# Patient Record
Sex: Female | Born: 1935
Health system: Southern US, Community
[De-identification: ages and names within clinical notes are randomized; demographics above are authoritative.]

## PROBLEM LIST (undated history)

## (undated) DIAGNOSIS — M8580 Other specified disorders of bone density and structure, unspecified site: Secondary | ICD-10-CM

## (undated) DIAGNOSIS — I350 Nonrheumatic aortic (valve) stenosis: Secondary | ICD-10-CM

## (undated) DIAGNOSIS — E039 Hypothyroidism, unspecified: Secondary | ICD-10-CM

## (undated) DIAGNOSIS — E785 Hyperlipidemia, unspecified: Secondary | ICD-10-CM

## (undated) DIAGNOSIS — I4891 Unspecified atrial fibrillation: Secondary | ICD-10-CM

## (undated) DIAGNOSIS — I10 Essential (primary) hypertension: Secondary | ICD-10-CM

## (undated) DIAGNOSIS — Z5320 Procedure and treatment not carried out because of patient's decision for unspecified reasons: Secondary | ICD-10-CM

## (undated) DIAGNOSIS — R011 Cardiac murmur, unspecified: Secondary | ICD-10-CM

## (undated) DIAGNOSIS — H8110 Benign paroxysmal vertigo, unspecified ear: Secondary | ICD-10-CM

## (undated) DIAGNOSIS — D649 Anemia, unspecified: Secondary | ICD-10-CM

## (undated) HISTORY — DX: Nonrheumatic aortic (valve) stenosis: I35.0

## (undated) HISTORY — PX: CYSTOSCOPY: SUR368

## (undated) HISTORY — DX: Other specified disorders of bone density and structure, unspecified site: M85.80

## (undated) HISTORY — DX: Procedure and treatment not carried out because of patient's decision for unspecified reasons: Z53.20

## (undated) HISTORY — DX: Essential (primary) hypertension: I10

## (undated) HISTORY — DX: Hypothyroidism, unspecified: E03.9

## (undated) HISTORY — DX: Hyperlipidemia, unspecified: E78.5

## (undated) HISTORY — DX: Unspecified atrial fibrillation: I48.91

## (undated) HISTORY — DX: Benign paroxysmal vertigo, unspecified ear: H81.10

## (undated) HISTORY — DX: Anemia, unspecified: D64.9

## (undated) HISTORY — PX: CARDIAC CATHETERIZATION: SHX172

## (undated) HISTORY — DX: Cardiac murmur, unspecified: R01.1

---

## 2001-06-06 HISTORY — PX: ABDOMINAL HYSTERECTOMY: SHX81

## 2002-03-22 ENCOUNTER — Observation Stay (HOSPITAL_COMMUNITY): Admission: RE | Admit: 2002-03-22 | Discharge: 2002-03-23 | Payer: Self-pay | Admitting: Obstetrics and Gynecology

## 2002-03-22 ENCOUNTER — Encounter: Payer: Self-pay | Admitting: Obstetrics and Gynecology

## 2002-03-22 ENCOUNTER — Encounter (INDEPENDENT_AMBULATORY_CARE_PROVIDER_SITE_OTHER): Payer: Self-pay | Admitting: Specialist

## 2003-06-07 DIAGNOSIS — H8110 Benign paroxysmal vertigo, unspecified ear: Secondary | ICD-10-CM

## 2003-06-07 HISTORY — DX: Benign paroxysmal vertigo, unspecified ear: H81.10

## 2003-11-11 ENCOUNTER — Inpatient Hospital Stay (HOSPITAL_COMMUNITY): Admission: EM | Admit: 2003-11-11 | Discharge: 2003-11-12 | Payer: Self-pay | Admitting: Emergency Medicine

## 2004-03-18 ENCOUNTER — Encounter: Payer: Self-pay | Admitting: Internal Medicine

## 2004-04-16 ENCOUNTER — Ambulatory Visit: Payer: Self-pay | Admitting: Internal Medicine

## 2004-04-27 ENCOUNTER — Ambulatory Visit: Payer: Self-pay | Admitting: Internal Medicine

## 2004-10-06 ENCOUNTER — Ambulatory Visit: Payer: Self-pay | Admitting: Internal Medicine

## 2005-04-01 ENCOUNTER — Ambulatory Visit: Payer: Self-pay | Admitting: Internal Medicine

## 2005-05-11 ENCOUNTER — Ambulatory Visit: Payer: Self-pay | Admitting: Internal Medicine

## 2006-03-07 ENCOUNTER — Ambulatory Visit: Payer: Self-pay | Admitting: Internal Medicine

## 2006-05-17 ENCOUNTER — Ambulatory Visit: Payer: Self-pay | Admitting: Internal Medicine

## 2006-05-17 LAB — CONVERTED CEMR LAB
ALT: 21 units/L (ref 0–40)
AST: 25 units/L (ref 0–37)
BUN: 17 mg/dL (ref 6–23)
Chol/HDL Ratio, serum: 2.5
Cholesterol: 164 mg/dL (ref 0–200)
Creatinine, Ser: 0.8 mg/dL (ref 0.4–1.2)
Glucose, Bld: 89 mg/dL (ref 70–99)
HDL: 66.1 mg/dL (ref >39.0)
LDL Cholesterol: 85 mg/dL (ref 0–99)
Potassium: 4.2 meq/L (ref 3.5–5.1)
TSH: 3.02 microintl units/mL (ref 0.35–5.50)
Triglyceride fasting, serum: 63 mg/dL (ref 0–149)
VLDL: 13 mg/dL (ref 0–40)

## 2007-04-06 ENCOUNTER — Ambulatory Visit: Payer: Self-pay | Admitting: Internal Medicine

## 2007-05-23 ENCOUNTER — Encounter: Payer: Self-pay | Admitting: Internal Medicine

## 2007-06-13 ENCOUNTER — Telehealth (INDEPENDENT_AMBULATORY_CARE_PROVIDER_SITE_OTHER): Payer: Self-pay | Admitting: *Deleted

## 2007-06-25 ENCOUNTER — Ambulatory Visit: Payer: Self-pay | Admitting: Internal Medicine

## 2007-06-25 DIAGNOSIS — E785 Hyperlipidemia, unspecified: Secondary | ICD-10-CM | POA: Insufficient documentation

## 2007-06-25 DIAGNOSIS — E039 Hypothyroidism, unspecified: Secondary | ICD-10-CM

## 2007-06-26 ENCOUNTER — Encounter (INDEPENDENT_AMBULATORY_CARE_PROVIDER_SITE_OTHER): Payer: Self-pay | Admitting: *Deleted

## 2007-07-03 ENCOUNTER — Ambulatory Visit: Payer: Self-pay | Admitting: Internal Medicine

## 2007-07-17 ENCOUNTER — Encounter: Payer: Self-pay | Admitting: Internal Medicine

## 2007-07-23 ENCOUNTER — Telehealth (INDEPENDENT_AMBULATORY_CARE_PROVIDER_SITE_OTHER): Payer: Self-pay | Admitting: *Deleted

## 2007-07-23 ENCOUNTER — Encounter (INDEPENDENT_AMBULATORY_CARE_PROVIDER_SITE_OTHER): Payer: Self-pay | Admitting: *Deleted

## 2008-03-04 ENCOUNTER — Ambulatory Visit: Payer: Self-pay | Admitting: Internal Medicine

## 2008-04-10 ENCOUNTER — Encounter (INDEPENDENT_AMBULATORY_CARE_PROVIDER_SITE_OTHER): Payer: Self-pay | Admitting: *Deleted

## 2008-04-10 ENCOUNTER — Telehealth (INDEPENDENT_AMBULATORY_CARE_PROVIDER_SITE_OTHER): Payer: Self-pay | Admitting: *Deleted

## 2008-07-01 ENCOUNTER — Ambulatory Visit: Payer: Self-pay | Admitting: Internal Medicine

## 2008-07-02 ENCOUNTER — Encounter (INDEPENDENT_AMBULATORY_CARE_PROVIDER_SITE_OTHER): Payer: Self-pay | Admitting: *Deleted

## 2008-07-09 ENCOUNTER — Telehealth (INDEPENDENT_AMBULATORY_CARE_PROVIDER_SITE_OTHER): Payer: Self-pay | Admitting: *Deleted

## 2008-07-16 ENCOUNTER — Ambulatory Visit: Payer: Self-pay | Admitting: Internal Medicine

## 2008-07-16 LAB — CONVERTED CEMR LAB
OCCULT 1: NEGATIVE
OCCULT 2: NEGATIVE
OCCULT 3: NEGATIVE

## 2008-07-17 ENCOUNTER — Encounter (INDEPENDENT_AMBULATORY_CARE_PROVIDER_SITE_OTHER): Payer: Self-pay | Admitting: *Deleted

## 2008-08-07 ENCOUNTER — Telehealth (INDEPENDENT_AMBULATORY_CARE_PROVIDER_SITE_OTHER): Payer: Self-pay | Admitting: *Deleted

## 2008-09-04 ENCOUNTER — Telehealth (INDEPENDENT_AMBULATORY_CARE_PROVIDER_SITE_OTHER): Payer: Self-pay | Admitting: *Deleted

## 2008-10-09 ENCOUNTER — Telehealth (INDEPENDENT_AMBULATORY_CARE_PROVIDER_SITE_OTHER): Payer: Self-pay | Admitting: *Deleted

## 2009-06-25 ENCOUNTER — Ambulatory Visit: Payer: Self-pay | Admitting: Internal Medicine

## 2009-06-25 DIAGNOSIS — N959 Unspecified menopausal and perimenopausal disorder: Secondary | ICD-10-CM | POA: Insufficient documentation

## 2009-06-25 DIAGNOSIS — I10 Essential (primary) hypertension: Secondary | ICD-10-CM | POA: Insufficient documentation

## 2009-06-25 DIAGNOSIS — L659 Nonscarring hair loss, unspecified: Secondary | ICD-10-CM | POA: Insufficient documentation

## 2009-09-02 ENCOUNTER — Telehealth (INDEPENDENT_AMBULATORY_CARE_PROVIDER_SITE_OTHER): Payer: Self-pay | Admitting: *Deleted

## 2010-06-23 ENCOUNTER — Telehealth (INDEPENDENT_AMBULATORY_CARE_PROVIDER_SITE_OTHER): Payer: Self-pay | Admitting: *Deleted

## 2010-07-04 LAB — CONVERTED CEMR LAB
ALT: 21 units/L (ref 0–35)
ALT: 21 units/L (ref 0–35)
ALT: 22 units/L (ref 0–35)
AST: 23 units/L (ref 0–37)
AST: 24 units/L (ref 0–37)
AST: 25 units/L (ref 0–37)
Albumin: 4 g/dL (ref 3.5–5.2)
Albumin: 4.1 g/dL (ref 3.5–5.2)
Albumin: 4.5 g/dL (ref 3.5–5.2)
Alkaline Phosphatase: 55 units/L (ref 39–117)
Alkaline Phosphatase: 63 units/L (ref 39–117)
Alkaline Phosphatase: 69 units/L (ref 39–117)
BUN: 15 mg/dL (ref 6–23)
BUN: 19 mg/dL (ref 6–23)
BUN: 21 mg/dL (ref 6–23)
Basophils Absolute: 0 10*3/uL (ref 0.0–0.1)
Basophils Absolute: 0 10*3/uL (ref 0.0–0.1)
Basophils Absolute: 0 10*3/uL (ref 0.0–0.1)
Basophils Relative: 0 % (ref 0.0–3.0)
Basophils Relative: 0.5 % (ref 0.0–1.0)
Basophils Relative: 0.9 % (ref 0.0–3.0)
Bilirubin, Direct: 0.1 mg/dL (ref 0.0–0.3)
Bilirubin, Direct: 0.1 mg/dL (ref 0.0–0.3)
Bilirubin, Direct: 0.1 mg/dL (ref 0.0–0.3)
CO2: 31 meq/L (ref 19–32)
CO2: 31 meq/L (ref 19–32)
CO2: 32 meq/L (ref 19–32)
Calcium: 9.3 mg/dL (ref 8.4–10.5)
Calcium: 9.3 mg/dL (ref 8.4–10.5)
Calcium: 9.6 mg/dL (ref 8.4–10.5)
Chloride: 102 meq/L (ref 96–112)
Chloride: 103 meq/L (ref 96–112)
Chloride: 99 meq/L (ref 96–112)
Cholesterol, target level: 200 mg/dL
Cholesterol: 170 mg/dL (ref 0–200)
Cholesterol: 174 mg/dL (ref 0–200)
Cholesterol: 189 mg/dL (ref 0–200)
Creatinine, Ser: 0.8 mg/dL (ref 0.4–1.2)
Creatinine, Ser: 0.8 mg/dL (ref 0.4–1.2)
Creatinine, Ser: 0.9 mg/dL (ref 0.4–1.2)
Eosinophils Absolute: 0.2 10*3/uL (ref 0.0–0.7)
Eosinophils Absolute: 0.2 10*3/uL (ref 0.0–0.7)
Eosinophils Absolute: 0.3 10*3/uL (ref 0.0–0.6)
Eosinophils Relative: 3.4 % (ref 0.0–5.0)
Eosinophils Relative: 5.9 % — ABNORMAL HIGH (ref 0.0–5.0)
Eosinophils Relative: 6.6 % — ABNORMAL HIGH (ref 0.0–5.0)
GFR calc Af Amer: 79 mL/min
GFR calc Af Amer: 90 mL/min
GFR calc non Af Amer: 65 mL/min
GFR calc non Af Amer: 74.41 mL/min (ref >60)
GFR calc non Af Amer: 75 mL/min
Glucose, Bld: 80 mg/dL (ref 70–99)
Glucose, Bld: 86 mg/dL (ref 70–99)
Glucose, Bld: 89 mg/dL (ref 70–99)
HCT: 39.8 % (ref 36.0–46.0)
HCT: 40.6 % (ref 36.0–46.0)
HCT: 41.1 % (ref 36.0–46.0)
HDL goal, serum: 40 mg/dL
HDL: 64.6 mg/dL (ref >39.0)
HDL: 69.8 mg/dL (ref >39.0)
HDL: 77.2 mg/dL (ref >39.00)
Hemoglobin: 13.8 g/dL (ref 12.0–15.0)
Hemoglobin: 13.9 g/dL (ref 12.0–15.0)
Hemoglobin: 14.1 g/dL (ref 12.0–15.0)
LDL Cholesterol: 83 mg/dL (ref 0–99)
LDL Cholesterol: 84 mg/dL (ref 0–99)
LDL Cholesterol: 94 mg/dL (ref 0–99)
LDL Goal: 160 mg/dL
Lymphocytes Relative: 26.6 % (ref 12.0–46.0)
Lymphocytes Relative: 27.4 % (ref 12.0–46.0)
Lymphocytes Relative: 29.8 % (ref 12.0–46.0)
Lymphs Abs: 1.1 10*3/uL (ref 0.7–4.0)
MCHC: 33.6 g/dL (ref 30.0–36.0)
MCHC: 34.4 g/dL (ref 30.0–36.0)
MCHC: 35.3 g/dL (ref 30.0–36.0)
MCV: 100 fL (ref 78.0–100.0)
MCV: 102.9 fL — ABNORMAL HIGH (ref 78.0–100.0)
MCV: 99.1 fL (ref 78.0–100.0)
Monocytes Absolute: 0.4 10*3/uL (ref 0.1–1.0)
Monocytes Absolute: 0.4 10*3/uL (ref 0.2–0.7)
Monocytes Absolute: 0.5 10*3/uL (ref 0.1–1.0)
Monocytes Relative: 10.7 % (ref 3.0–12.0)
Monocytes Relative: 9.7 % (ref 3.0–11.0)
Monocytes Relative: 9.8 % (ref 3.0–12.0)
Neutro Abs: 2 10*3/uL (ref 1.4–7.7)
Neutro Abs: 2.2 10*3/uL (ref 1.4–7.7)
Neutro Abs: 2.9 10*3/uL (ref 1.4–7.7)
Neutrophils Relative %: 53.4 % (ref 43.0–77.0)
Neutrophils Relative %: 56 % (ref 43.0–77.0)
Neutrophils Relative %: 59.3 % (ref 43.0–77.0)
Platelets: 166 10*3/uL (ref 150.0–400.0)
Platelets: 184 10*3/uL (ref 150–400)
Platelets: 186 10*3/uL (ref 150–400)
Potassium: 3.5 meq/L (ref 3.5–5.1)
Potassium: 3.7 meq/L (ref 3.5–5.1)
Potassium: 3.7 meq/L (ref 3.5–5.1)
RBC: 4 M/uL (ref 3.87–5.11)
RBC: 4.02 M/uL (ref 3.87–5.11)
RBC: 4.06 M/uL (ref 3.87–5.11)
RDW: 11.2 % — ABNORMAL LOW (ref 11.5–14.6)
RDW: 11.6 % (ref 11.5–14.6)
RDW: 11.7 % (ref 11.5–14.6)
Sodium: 138 meq/L (ref 135–145)
Sodium: 142 meq/L (ref 135–145)
Sodium: 143 meq/L (ref 135–145)
TSH: 2.98 microintl units/mL (ref 0.35–5.50)
TSH: 3.08 microintl units/mL (ref 0.35–5.50)
TSH: 4.85 microintl units/mL (ref 0.35–5.50)
Total Bilirubin: 0.9 mg/dL (ref 0.3–1.2)
Total Bilirubin: 1 mg/dL (ref 0.3–1.2)
Total Bilirubin: 1 mg/dL (ref 0.3–1.2)
Total CHOL/HDL Ratio: 2
Total CHOL/HDL Ratio: 2.5
Total CHOL/HDL Ratio: 2.6
Total Protein: 6.9 g/dL (ref 6.0–8.3)
Total Protein: 7.2 g/dL (ref 6.0–8.3)
Total Protein: 7.8 g/dL (ref 6.0–8.3)
Triglycerides: 100 mg/dL (ref 0–149)
Triglycerides: 110 mg/dL (ref 0–149)
Triglycerides: 88 mg/dL (ref 0.0–149.0)
VLDL: 17.6 mg/dL (ref 0.0–40.0)
VLDL: 20 mg/dL (ref 0–40)
VLDL: 22 mg/dL (ref 0–40)
Vit D, 25-Hydroxy: 26 ng/mL — ABNORMAL LOW (ref 30–89)
WBC: 3.8 10*3/uL — ABNORMAL LOW (ref 4.5–10.5)
WBC: 3.9 10*3/uL — ABNORMAL LOW (ref 4.5–10.5)
WBC: 4.9 10*3/uL (ref 4.5–10.5)

## 2010-07-06 ENCOUNTER — Ambulatory Visit
Admission: RE | Admit: 2010-07-06 | Discharge: 2010-07-06 | Payer: Self-pay | Source: Home / Self Care | Attending: Internal Medicine | Admitting: Internal Medicine

## 2010-07-06 ENCOUNTER — Encounter: Payer: Self-pay | Admitting: Internal Medicine

## 2010-07-06 ENCOUNTER — Other Ambulatory Visit: Payer: Self-pay | Admitting: Internal Medicine

## 2010-07-06 LAB — LIPID PANEL
Cholesterol: 192 mg/dL (ref 0–200)
HDL: 76.6 mg/dL (ref >39.00)
LDL Cholesterol: 91 mg/dL (ref 0–99)
Triglycerides: 124 mg/dL (ref 0.0–149.0)

## 2010-07-06 LAB — BASIC METABOLIC PANEL
BUN: 20 mg/dL (ref 6–23)
Chloride: 103 mEq/L (ref 96–112)
Creatinine, Ser: 0.7 mg/dL (ref 0.4–1.2)

## 2010-07-06 LAB — TSH: TSH: 4.21 u[IU]/mL (ref 0.35–5.50)

## 2010-07-06 LAB — HEPATIC FUNCTION PANEL
ALT: 19 U/L (ref 0–35)
Total Bilirubin: 0.7 mg/dL (ref 0.3–1.2)
Total Protein: 7.1 g/dL (ref 6.0–8.3)

## 2010-07-06 NOTE — Progress Notes (Signed)
Summary: Refill Request  Phone Note Refill Request Message from:  Pharmacy on [facility] Fax #: [phone]  Refills Requested: Medication #1:  LIPITOR 10 MG  TABS 1 by mouth at bedtime   Dosage confirmed as above?Dosage Confirmed   Supply Requested: 3 months   Last Refilled: 06/03/2009 Next Appointment Scheduled: none Initial call taken by: Harold Barban,  September 02, 2009 12:48 PM    Prescriptions: LIPITOR 10 MG  TABS (ATORVASTATIN CALCIUM) 1 by mouth at bedtime  #90 x 2   Entered by:   Shonna Chock   Authorized by:   Marga Melnick MD   Signed by:   Shonna Chock on 09/02/2009   Method used:   Electronically to        [facility]* (retail)       [address]       [place], [place]  [address]       Ph: [phone]       Fax: [phone]   RxID:   [id number]

## 2010-07-06 NOTE — Assessment & Plan Note (Signed)
Summary: EST MEDICARE //PH   Vital Signs:  Patient profile:   77 year old female Height:      68.25 inches Weight:      140.0 pounds BMI:     21.21 Temp:     98.1 degrees F oral Pulse rate:   59 / minute Resp:     14 per minute BP sitting:   124 / 78  (left arm) Cuff size:   large  Vitals Entered By: Shonna Chock (June 25, 2009 8:25 AM)   CC: Yearly follow-up on meds and fasting labs , General Medical Evaluation Comments REVIEWED MED LIST, PATIENT AGREED DOSE AND INSTRUCTION CORRECT   Flu Vaccine Consent Questions     Do you have a history of severe allergic reactions to this vaccine? no    Any prior history of allergic reactions to egg and/or gelatin? no    Do you have a sensitivity to the preservative Thimersol? no    Do you have a past history of Guillan-Barre Syndrome? no    Do you currently have an acute febrile illness? no    Have you ever had a severe reaction to latex? no    Vaccine information given and explained to patient? yes    Are you currently pregnant? no    Lot Number:[id number]   Exp Date:12/03/2009   Site Given  Right Deltoid IM   CC:  Yearly follow-up on meds and fasting labs  and General Medical Evaluation.  History of Present Illness: Judy Walker is here for a BCBS/ Medicare physical; she is asymptomatic. 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  Allergies (verified): No Known Drug Allergies  Past History:  Past Medical History: Hyperlipidemia (total cholesterol 340 in 2003) Hypothyroidism Benign Positional Vertigo 2005, PMH of  Past Surgical History: Hysterectomy  & BSO for cystic ovaries Cath 2004 neg; bladder cysts resected age 79; No colonoscopy :"I elected not to have one"(SOC reviewed 06/25/2009; she continues to decline colonoscopy)  Family History: Father: CAD,DM, MI in 80s Mother: renal  cancer Siblings: 2 sisters breast CA,bro CHF,sister cirrhosis,sister mini strokes; DM in 3  bro &  3 sisters; sister esophageal CA; sister ovarian CA; 2 bro d MI ( 1 in 60s & 2nd in 61s)  Social History: No diet Married(husband has severe intractable Mental Health  issues ) Never Smoked Regular exercise(see HPI) Alcohol use-no Retired  Review of Systems  The patient denies anorexia, fever, weight loss, weight gain, vision loss, decreased hearing, hoarseness, chest pain, syncope, dyspnea on exertion, peripheral edema, prolonged cough, headaches, hemoptysis, abdominal pain, melena, hematochezia, severe indigestion/heartburn, hematuria, incontinence, suspicious skin lesions, depression, unusual weight change, abnormal bleeding, and enlarged lymph nodes.         Increased hair loss; no other skin or nail changes. No temp intolerance. Gyn survelliance up to date.  Physical Exam  General:  Thin but well-nourished,in no acute distress; alert,appropriate and cooperative throughout examination Head:  Normocephalic and atraumatic without obvious abnormalities. No apparent alopecia  Eyes:  No corneal or conjunctival inflammation noted.  Perrla. Funduscopic exam benign, without hemorrhages, exudates or papilledema.  Ears:  External ear exam shows no significant lesions or deformities.  Otoscopic examination reveals clear canals, tympanic membranes are intact bilaterally without bulging, retraction, inflammation or discharge. Hearing is grossly normal bilaterally.Minimal TM scarring Nose:  External nasal examination shows no deformity or inflammation. Nasal  mucosa are pink and moist without lesions or exudates. Septal dislocation Mouth:  Oral mucosa and oropharynx without lesions or exudates.  Teeth in good repair. Neck:  No deformities, masses, or tenderness noted. Lungs:  Normal respiratory effort, chest expands symmetrically. Lungs are clear to auscultation, no crackles or wheezes. Heart:  normal  rate, regular rhythm, no gallop, no rub, no JVD, no HJR, and grade 1 /6 systolic murmur.   Abdomen:  Bowel sounds positive,abdomen soft and non-tender without masses, organomegaly or hernias noted. Aorta palpable but no AAA Genitalia:  Dr Elana Alm Msk:  No deformity or scoliosis noted of thoracic or lumbar spine.   Pulses:  R and L carotid,radial,dorsalis pedis and posterior tibial pulses are full and equal bilaterally Extremities:  No clubbing, cyanosis, edema, or deformity noted with normal full range of motion of all joints.   Neurologic:  alert & oriented X3 and DTRs symmetrical and normal.   Skin:  Intact without suspicious lesions or rashes Cervical Nodes:  No lymphadenopathy noted Axillary Nodes:  No palpable lymphadenopathy Psych:  memory intact for recent and remote, normally interactive, good eye contact, not anxious appearing, and not depressed appearing.     Impression & Recommendations:  Problem # 1:  PREVENTIVE HEALTH CARE (ICD-V70.0)  Orders: Venipuncture (42706) TLB-Lipid Panel (80061-LIPID) TLB-BMP (Basic Metabolic Panel-BMET) (80048-METABOL) TLB-CBC Platelet - w/Differential (85025-CBCD) TLB-Hepatic/Liver Function Pnl (80076-HEPATIC) TLB-TSH (Thyroid Stimulating Hormone) (84443-TSH) EKG w/ Interpretation (93000) T-Vitamin D (25-Hydroxy) (23762-83151)  Problem # 2:  HAIR LOSS (ICD-704.00)  Orders: Venipuncture (76160)  Problem # 3:  HYPOTHYROIDISM (ICD-244.9)  Her updated medication list for this problem includes:    Levothyroxine Sodium 75 Mcg Tabs (Levothyroxine sodium) .Marland Kitchen... 1 qd  Orders: Venipuncture (73710) TLB-TSH (Thyroid Stimulating Hormone) (84443-TSH)  Problem # 4:  HYPERLIPIDEMIA (ICD-272.4)  Her updated medication list for this problem includes:    Lipitor 10 Mg Tabs (Atorvastatin calcium) .Marland Kitchen... 1 by mouth at bedtime  Orders: Venipuncture (62694) TLB-Lipid Panel (80061-LIPID) EKG w/ Interpretation (93000)  Problem # 5:  HYPERTENSION  (ICD-401.9)  Her updated medication list for this problem includes:    Hydrochlorothiazide 12.5 Mg Tabs (Hydrochlorothiazide) .Marland Kitchen... 1 by mouth once daily  Orders: TLB-BMP (Basic Metabolic Panel-BMET) (80048-METABOL) EKG w/ Interpretation (93000)  Problem # 6:  POSTMENOPAUSAL SYNDROME (ICD-627.9)  Orders: T-Vitamin D (25-Hydroxy) (85462-70350)  Complete Medication List: 1)  Lisinopril 40 Mg Tabs (lisinopril)  .Marland Kitchen.. 1 by mouth once daily 2)  Lipitor 10 Mg Tabs (Atorvastatin calcium) .Marland Kitchen.. 1 by mouth at bedtime 3)  Hydrochlorothiazide 12.5 Mg Tabs (Hydrochlorothiazide) .Marland Kitchen.. 1 by mouth once daily 4)  Flax Seed Oil  .Marland Kitchen.. 1 by mouth once daily 5)  Calcium 500mg   .... 1 by mouth two times a day 6)  Levothyroxine Sodium 75 Mcg Tabs (Levothyroxine sodium) .Marland Kitchen.. 1 qd 7)  Folic Acid 400 Mcg Tabs (Folic acid) .Marland Kitchen.. 1 by mouth once daily 8)  Multivitamins Tabs (Multiple vitamin) .Marland Kitchen.. 1 by mouth once daily  Other Orders: Flu Vaccine 8yrs + (09381) Administration Flu vaccine - MCR (W2993)  Patient Instructions: 1)  Check your Blood Pressure regularly. If it is above:140/90 ON AVERAGE  you should make an appointment. Prescriptions: LEVOTHYROXINE SODIUM 75 MCG  TABS (LEVOTHYROXINE SODIUM) 1 qd  #90 x 3   Entered and Authorized by:   Marga Melnick MD   Signed by:   Marga Melnick MD on 06/25/2009   Method used:   Faxed to ...       [facility]* (retail)  [address]       [place], [place]  [address]       Ph: [phone]       Fax: [phone]   RxID:   [id number] HYDROCHLOROTHIAZIDE 12.5 MG  TABS (HYDROCHLOROTHIAZIDE) 1 by mouth once daily  #90 x 3   Entered and Authorized by:   Marga Melnick MD   Signed by:   Marga Melnick MD on 06/25/2009   Method used:   Faxed to ...       [facility]* (retail)       [address]       [place], [place]  [address]       Ph: [phone]       Fax: [phone]   RxID:   [id number] LIPITOR 10 MG  TABS (ATORVASTATIN CALCIUM) 1  by mouth at bedtime  #90 x 3   Entered and Authorized by:   Marga Melnick MD   Signed by:   Marga Melnick MD on 06/25/2009   Method used:   Faxed to ...       [facility]* (retail)       [address]       [place], [place]  [address]       Ph: [phone]       Fax: [phone]   RxID:   [id number] LISINOPRIL 40 MG  TABS (LISINOPRIL) 1 by mouth once daily  #90 x 3   Entered and Authorized by:   Marga Melnick MD   Signed by:   Marga Melnick MD on 06/25/2009   Method used:   Faxed to ...       [facility]* (retail)       [address]       [place], [place]  [address]       Ph: [phone]       Fax: [phone]   RxID:   [id number]

## 2010-07-08 NOTE — Progress Notes (Signed)
Summary: REFILL  Phone Note Refill Request Call back at [phone] Message from:  Pharmacy on June 23, 2010 4:22 PM  Refills Requested: Medication #1:  LISINOPRIL 40 MG  TABS (LISINOPRIL) 1 by mouth once daily   Dosage confirmed as above?Dosage Confirmed   Supply Requested: 1 month [facility]  Next Appointment Scheduled: 1.31.12 Initial call taken by: Lavell Islam,  June 23, 2010 4:22 PM    Prescriptions: LISINOPRIL 40 MG  TABS (LISINOPRIL) 1 by mouth once daily  #30 x 0   Entered by:   Shonna Chock CMA   Authorized by:   Marga Melnick MD   Signed by:   Shonna Chock CMA on 06/24/2010   Method used:   Faxed to ...       [facility]* (retail)       [address]       [place], [place]  [address]       Ph: [phone]       Fax: [phone]   RxID:   [id number]

## 2010-07-14 ENCOUNTER — Other Ambulatory Visit (INDEPENDENT_AMBULATORY_CARE_PROVIDER_SITE_OTHER): Payer: Medicare Other

## 2010-07-14 ENCOUNTER — Encounter: Payer: Self-pay | Admitting: Internal Medicine

## 2010-07-14 DIAGNOSIS — Z1211 Encounter for screening for malignant neoplasm of colon: Secondary | ICD-10-CM

## 2010-07-14 LAB — CONVERTED CEMR LAB: OCCULT 1: NEGATIVE

## 2010-07-14 NOTE — Assessment & Plan Note (Signed)
Summary: CPX,FASTING,MEDICARE & BCBS/RH......   Vital Signs:  Patient profile:   77 year old female Height:      68 inches Weight:      143 pounds BMI:     21.82 Temp:     98.0 degrees F oral Pulse rate:   80 / minute Resp:     14 per minute BP sitting:   120 / 72  (left arm) Cuff size:   regular  Vitals Entered By: Shonna Chock CMA (July 06, 2010 9:54 AM) CC: Lipid Management  Vision Screening:Left eye with correction: 20 / 50 Right eye with correction: 20 / 50 Both eyes with correction: 20 / 50       Vision Comments: Patient wears glasses at all times   Vision Entered By: Shonna Chock CMA (July 06, 2010 9:54 AM)   CC:  Lipid Management.  History of Present Illness: Here for Medicare AWV: 1.Risk factors based on Past M, S, F history:see Diagnoses ; chart updated 2.Physical Activities:  treadmil , walking 3-4X/week 3.Depression/mood:no issues  4.Hearing:whisper heard @ 6 ft  5.ADL's: no limitations 6.Fall Risk:no issues   7.Home Safety: no safety  issues 8.Height, weight, &visual acuity:see VS 9.Counseling: POA & Living Will in place 10.Labs ordered based on risk factors:  11. Referral Coordination: see Orders/ Instructions. Mammogram  today. Colonoscopy SOC reviewed. 12. Care Plan: see Instructions  13. Cognitive Assessment: Oriented X 3;  memory & recall  good    ; "WORLD" spelled backwards ; mood  & affect normal.    Hyperlipidemia Follow-Up: The patient denies muscle aches, GI upset, abdominal pain, flushing, itching, constipation, diarrhea, and fatigue.  The patient denies the following symptoms: chest pain/pressure, exercise intolerance, dypsnea, palpitations, syncope, and pedal edema.  Compliance with medications (by patient report) has been near 100%.  Dietary compliance has been excellent.  Adjunctive measures currently used by the patient include fiber, folic acid, and  flax seed  oil supplements.      Hypertension Follow-Up: The patient denies  lightheadedness, urinary frequency, and headaches.  Compliance with medications (by patient report) has been near 100%.  Adjunctive measures currently used by the patient include  modified  salt restriction.  BP < 135/85 on average @ home.   Lipid Management History:      Positive NCEP/ATP III risk factors include female age 59 years old or older, family history for ischemic heart disease (males less than 56 years old), and hypertension.  Negative NCEP/ATP III risk factors include no history of early menopause without estrogen hormone replacement, non-diabetic, HDL cholesterol greater than 60, non-tobacco-user status, no ASHD (atherosclerotic heart disease), no prior stroke/TIA, no peripheral vascular disease, and no history of aortic aneurysm.     Preventive Screening-Counseling & Management  Alcohol-Tobacco     Alcohol drinks/day: 0     Smoking Status: never  Caffeine-Diet-Exercise     Caffeine use/day: 2 cups coffee, rare tea  Hep-HIV-STD-Contraception     Dental Visit-last 6 months care giving prevents F/U     Sun Exposure-Excessive: no  Safety-Violence-Falls     Seat Belt Use: yes     Smoke Detectors: yes      Blood Transfusions:  no.        Travel History:  [place]  remotely.    Allergies (verified): No Known Drug Allergies  Past History:  Past Medical History: Hyperlipidemia (total cholesterol 340 in 2003). NHDL 61 , TG 75. NMR Lipoprofile 2005:LDL 84( 933/285), LDL goal = < 120, ideally <  90. Framingham Study LDL goal = < 130. Hypothyroidism Benign Positional Vertigo 2005, PMH of  Past Surgical History: Hysterectomy  & BSO for cystic ovaries 2003 Catheterization  2004 normal ; bladder cysts resected  @ age 37; No colonoscopy :"I elected not to have one"(SOC reviewed 07/06/2010; she continues to decline colonoscopy)  Family History: Father: CAD,DM, MI in 57s Mother: renal cancer Siblings: 2 sisters: breast cancer,bro: CHF,sister: cirrhosis,sister :mini strokes; DM in  3  bro &  3 sisters; sister: esophageal  cancer; sister: ovarian cancer ; 2 bro: d MI ( 1 in 3s & 2nd in 67s); sister: Alzheimer's  Social History: No diet 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         "Tickle " & cough  ? from ACE-I.  Physical Exam  General:  well-nourished;alert,appropriate and cooperative throughout examination Head:  Normocephalic and atraumatic without obvious abnormalities.  Eyes:  No corneal or conjunctival inflammation noted.  Perrla. Funduscopic exam benign, without hemorrhages, exudates or papilledema.  Ears:  External ear exam shows no significant lesions or deformities.  Otoscopic examination reveals clear canals, tympanic membranes are intact bilaterally without bulging, retraction, inflammation or discharge. Hearing is grossly normal bilaterally. Nose:  External nasal examination shows no deformity or inflammation. Nasal mucosa are pink and moist without lesions or exudates. Septal dislocation Mouth:  Oral mucosa and oropharynx without lesions or exudates.  Teeth in good repair. Neck:  No deformities, masses, or tenderness noted. Lungs:  Normal respiratory effort, chest expands symmetrically. Lungs are clear to auscultation, no crackles or wheezes. Heart:  normal rate, regular rhythm, no gallop, no rub, no JVD, no HJR, and grade1  /6 systolic murmur.     Abdomen:  Bowel sounds positive,abdomen soft and non-tender without masses, organomegaly or hernias noted. Aorta palpable w/o AAA Genitalia:  Dr Elana Alm Msk:  No deformity or scoliosis noted of thoracic or lumbar spine.   Pulses:  R and L carotid,radial,dorsalis pedis and posterior tibial pulses are full and equal bilaterally Extremities:  No clubbing, cyanosis, edema, or deformity noted with normal full range of motion of all joints.   Neurologic:  alert & oriented X3 and DTRs symmetrical and normal.   Skin:  Intact without suspicious lesions or rashes Cervical Nodes:  No lymphadenopathy noted Axillary Nodes:  No palpable lymphadenopathy Psych:  memory intact for recent and remote, normally interactive, and good eye contact.     Impression & Recommendations:  Problem # 1:  PREVENTIVE HEALTH CARE (ICD-V70.0)  Orders: Medicare -1st Annual Wellness Visit [phone])  Problem # 2:  COUGH (ICD-786.2)  ? due to ACE-I  Problem # 3:  HYPERTENSION (ICD-401.9)  controlled Her updated medication list for this problem includes:    Hydrochlorothiazide 12.5 Mg Tabs (Hydrochlorothiazide) .Marland Kitchen... 1 by mouth once daily    Losartan Potassium 100 Mg Tabs (Losartan potassium) .Marland Kitchen... 1 once daily  Orders: EKG w/ Interpretation (93000) Venipuncture (34742) TLB-BMP (Basic Metabolic Panel-BMET) (80048-METABOL) Prescription Created Electronically [phone])  Problem # 4:  HYPERLIPIDEMIA (ICD-272.4)  Her updated medication list for this problem includes:    Lipitor 10 Mg Tabs (Atorvastatin calcium) .Marland Kitchen... 1 by mouth at bedtime  Orders: Venipuncture ([address]) TLB-Lipid Panel (80061-LIPID) TLB-Hepatic/Liver Function Pnl (80076-HEPATIC) Prescription Created Electronically [phone]) Specimen Handling (09811)  Problem # 5:  HYPOTHYROIDISM (ICD-244.9)  Her updated medication list for this problem includes:    Levothyroxine Sodium 75 Mcg Tabs (Levothyroxine sodium) .Marland Kitchen... 1 qd  Orders: Venipuncture  (91478) TLB-TSH (Thyroid Stimulating Hormone) (29562-ZHY) Prescription Created Electronically [phone]) Specimen Handling (46962)  Complete Medication List: 1)  Lipitor 10 Mg Tabs (Atorvastatin calcium) .Marland Kitchen.. 1 by mouth at bedtime 2)  Hydrochlorothiazide 12.5 Mg Tabs (Hydrochlorothiazide) .Marland Kitchen.. 1 by mouth once daily 3)  Flax Seed Oil  .Marland Kitchen.. 1 by mouth once daily 4)  Calcium 500mg   .... 1 by mouth two times a day 5)  Levothyroxine Sodium 75 Mcg Tabs (Levothyroxine sodium) .Marland Kitchen.. 1 qd 6)  Folic Acid 400 Mcg Tabs (Folic acid) .Marland Kitchen.. 1 by mouth once daily 7)  Multivitamins Tabs (Multiple vitamin) .Marland Kitchen.. 1 by mouth once daily 8)  Vitamin D 2000 Unit Tabs (Cholecalciferol) .Marland Kitchen.. 1 by mouth once daily 9)  Losartan Potassium 100 Mg Tabs (Losartan potassium) .Marland Kitchen.. 1 once daily  Other Orders: Tdap => 45yrs IM (95284) Admin 1st Vaccine (13244)  Lipid Assessment/Plan:      Based on NCEP/ATP III, the patient's risk factor category is "2 or more risk factors and a calculated 10 year CAD risk of < 20%".  The patient's lipid goals are as follows: Total cholesterol goal is 200; LDL cholesterol goal is 160; HDL cholesterol goal is 40; Triglyceride goal is 150.  Her LDL cholesterol goal has been met.    Patient Instructions: 1)  Please consider Colonoscopy. Ask  [facility] if  BMD is due  as discussed; I'll order if needed. Complete stool cards.Monitor cough after BP med change. 2)  Check your Blood Pressure regularly. If it is above: 135/85 ON AVERAGE  you should make an appointment. Prescriptions: LIPITOR 10 MG  TABS (ATORVASTATIN CALCIUM) 1 by mouth at bedtime  #90 x 3   Entered and Authorized by:   Marga Melnick MD   Signed by:   Marga Melnick MD on 07/06/2010   Method used:   Electronically to        [facility]* (retail)       [address]       [place], [place]  [address]       Ph: [phone]       Fax: [phone]   RxID:   [id number] LEVOTHYROXINE SODIUM 75 MCG  TABS (LEVOTHYROXINE SODIUM) 1 qd   #90 x 3   Entered and Authorized by:   Marga Melnick MD   Signed by:   Marga Melnick MD on 07/06/2010   Method used:   Electronically to        [facility]* (retail)       [address]       [place], [place]  [address]       Ph: [phone]       Fax: [phone]   RxID:   [id number] HYDROCHLOROTHIAZIDE 12.5 MG  TABS (HYDROCHLOROTHIAZIDE) 1 by mouth once daily  #90 x 3   Entered and Authorized by:   Marga Melnick MD   Signed by:   Marga Melnick MD on 07/06/2010   Method used:   Electronically to        [facility]* (retail)  [address]       [place], [place]  [address]       Ph: [phone]       Fax: [phone]   RxID:   [id number] LOSARTAN POTASSIUM 100 MG TABS (LOSARTAN POTASSIUM) 1 once daily  #30 x 11   Entered and Authorized by:   Marga Melnick MD   Signed by:   Marga Melnick MD on 07/06/2010   Method used:   Electronically to        [facility]* (retail)       [address]       [place], [place]  [address]       Ph: [phone]       Fax: [phone]   RxID:   [id number]    Orders Added: 1)  Medicare -1st Annual Wellness Visit [G0438] 2)  Est. Patient Level III [88416] 3)  EKG w/ Interpretation [93000] 4)  Venipuncture [36415] 5)  TLB-Lipid Panel [80061-LIPID] 6)  TLB-BMP (Basic Metabolic Panel-BMET) [80048-METABOL] 7)  TLB-Hepatic/Liver Function Pnl [80076-HEPATIC] 8)  TLB-TSH (Thyroid Stimulating Hormone) [84443-TSH] 9)  Prescription Created Electronically [G8553] 10)  Tdap => 67yrs IM [90715] 11)  Admin 1st Vaccine [90471] 12)  Specimen Handling [99000]   Immunization History:  Pneumovax Immunization History:    Pneumovax:  historical (05/11/2005)  Immunizations Administered:  Tetanus Vaccine:    Vaccine Type: Tdap    Site: right deltoid    Mfr: GlaxoSmithKline    Dose: 0.5 ml    Route: IM    Given by: Shonna Chock CMA    Exp. Date: 03/25/2012    Lot #: [id number]    VIS given: 04/23/08 version given  July 06, 2010.   Immunization History:  Pneumovax Immunization History:    Pneumovax:  Historical (05/11/2005)  Immunizations Administered:  Tetanus Vaccine:    Vaccine Type: Tdap    Site: right deltoid    Mfr: GlaxoSmithKline    Dose: 0.5 ml    Route: IM    Given by: Shonna Chock CMA    Exp. Date: 03/25/2012    Lot #: [id number]    VIS given: 04/23/08 version given July 06, 2010.

## 2010-08-04 ENCOUNTER — Telehealth (INDEPENDENT_AMBULATORY_CARE_PROVIDER_SITE_OTHER): Payer: Self-pay | Admitting: *Deleted

## 2010-08-12 NOTE — Progress Notes (Signed)
Summary: Atorvastatin refill  Phone Note Refill Request Message from:  Fax from Pharmacy on August 04, 2010 12:09 PM  Refills Requested: Medication #1:  LIPITOR 10 MG  TABS 1 by mouth at bedtime   Last Refilled: 05/13/2010   Notes: note on fax states "no refills left" [facility], [address], [place], [place]  [address]    phone - [phone]   fax - [phone]    qty - 90  Next Appointment Scheduled: none Initial call taken by: Jerolyn Shin,  August 04, 2010 12:16 PM    Prescriptions: LIPITOR 10 MG  TABS (ATORVASTATIN CALCIUM) 1 by mouth at bedtime  #90 x 3   Entered by:   Shonna Chock CMA   Authorized by:   Marga Melnick MD   Signed by:   Shonna Chock CMA on 08/04/2010   Method used:   Electronically to        [facility]* (retail)       [address]       [place], [place]  [address]       Ph: [phone]       Fax: [phone]   RxID:   [id number]

## 2010-08-13 ENCOUNTER — Telehealth (INDEPENDENT_AMBULATORY_CARE_PROVIDER_SITE_OTHER): Payer: Self-pay | Admitting: *Deleted

## 2010-08-17 NOTE — Progress Notes (Signed)
Summary: lost husband would like something called in  Phone Note Call from Patient Call back at [phone]   Caller: Daughter-Beverly Summary of Call: Spoke w/ patient daughter says that patient isn't doing well husband past 2 days ago and patient is unable to sleep and would like to have something called into pharmacy to help.  [facility]  Follow-up for Phone Call        spoke w/ daughter aware prescription called in to pharmacy.Marland KitchenMarland KitchenDoristine Devoid CMA  August 13, 2010 10:12 AM     New/Updated Medications: LORAZEPAM 0.5 MG TABS (LORAZEPAM) 1 tablet every 8 hours as needed Prescriptions: LORAZEPAM 0.5 MG TABS (LORAZEPAM) 1 tablet every 8 hours as needed  #30 x 0   Entered by:   Doristine Devoid CMA   Authorized by:   Marga Melnick MD   Signed by:   Doristine Devoid CMA on 08/13/2010   Method used:   Telephoned to ...       [facility]* (retail)       [address]       [place], [place]  [address]       Ph: [phone]       Fax: [phone]   RxID:   [id number]

## 2010-09-28 ENCOUNTER — Encounter: Payer: Self-pay | Admitting: Internal Medicine

## 2010-09-28 ENCOUNTER — Ambulatory Visit (INDEPENDENT_AMBULATORY_CARE_PROVIDER_SITE_OTHER): Payer: Medicare Other | Admitting: Internal Medicine

## 2010-09-28 DIAGNOSIS — J069 Acute upper respiratory infection, unspecified: Secondary | ICD-10-CM

## 2010-09-28 DIAGNOSIS — L98499 Non-pressure chronic ulcer of skin of other sites with unspecified severity: Secondary | ICD-10-CM

## 2010-09-28 NOTE — Assessment & Plan Note (Signed)
Symptoms consistent with a URI in the last 24 hours. see instructions If no better will consider antibiotics

## 2010-09-28 NOTE — Progress Notes (Signed)
  Subjective:    Patient ID: Judy Walker, female    DOB: 1932/07/09, 77 y.o.   MRN: [id number]  HPI  Symptoms started yesterday, sore throat, mild cough, very mild sputum production. Some chills. Also has a long history of a skin lesion, wonders if she needs to see a dermatologist.  Past Medical History  Diagnosis Date  . Hyperlipemia     total cholesterol 340 in 2003, NHDL 61, TG 75. NMR Lipoprofile 2005: LDL 84 (933/285), LDL glad =<120 ideally <90. Framingham study LDL goal <130  . Hypothyroidism   . Benign positional vertigo 2005  . Colonoscopy refused     " I elected not to have one" SOC reviewed 07/06/10; she continues to decline colonscopy   Past Surgical History  Procedure Date  . Abdominal hysterectomy     & BSO for cystic ovaries 2003  . Cardiac catheterization     normal  . Cystectomy     bladder @ age 56     Review of Systems No fevers No shortness of breath or wheezing. Mild nausea without vomiting. Denies any sinus congestion. No history of asthma or tobacco abuse    Objective:   Physical Exam Alert, oriented x3 Ears with normal tympanic membranes Throat without redness or discharge Lungs: Clear to auscultation bilaterally, a few rhonchi with cough. Skin: At the right pretibial area she has a 1.5 cm skin lesion, the center seems like a very dry ulcer.       Assessment & Plan:

## 2010-09-28 NOTE — Patient Instructions (Signed)
Rest, fluids , tylenol For cough, take Robitussin DM 2 teaspoons every 8 hours as needed  call if no better in few days Call anytime if the symptoms are severe, you have high fever, short of breath, persistent chest pain

## 2010-09-28 NOTE — Assessment & Plan Note (Signed)
Long history of skin lesion, on today's exam she has pretibial dry ulcer. I strongly recommend her to see a dermatologist, I offer her a referral but she declined it. States she will make her own appointment.

## 2010-09-30 ENCOUNTER — Telehealth: Payer: Self-pay | Admitting: *Deleted

## 2010-09-30 MED ORDER — AMOXICILLIN 500 MG PO CAPS: 500.0000 mg | ORAL_CAPSULE | Freq: Two times a day (BID) | ORAL | Status: AC

## 2010-09-30 NOTE — Telephone Encounter (Signed)
call amoxicillin 500 mg 2 tablets twice a day for one week, #28. Office visit if not improving in few days. Urgent care eval during the weekend if symptoms severe

## 2010-09-30 NOTE — Telephone Encounter (Signed)
Pt aware rx sent to pharmacy.

## 2010-10-22 NOTE — Consult Note (Signed)
NAME:  Judy Walker, Judy Walker                       ACCOUNT NO.:  [id number]   MEDICAL RECORD NO.:  [id number]                   PATIENT TYPE:  EMS   LOCATION:  MAJO                                 FACILITY:  [facility]   PHYSICIAN:  Charlies Constable, M.D. [facility]              DATE OF BIRTH:   DATE OF CONSULTATION:  11/11/2003  DATE OF DISCHARGE:                                   CONSULTATION   CARDIOLOGY CONSULTATION:   CONSULTING PHYSICIAN:  Titus Dubin. Alwyn Ren, M.D. [facility]   REASON FOR CONSULTATION:  Evaluation of chest pain and presyncope.   PAST MEDICAL HISTORY:  Judy Walker is 77 years old and has no prior history  of known heart disease.  Today at her home, she was walking and suddenly  developed lightheadedness.  She went to the commode, but continued to be  very lightheaded and lay down on the ground to prevent herself from passing  out.  She had some pressure in her chest and also had some diaphoresis and  nausea.  Her symptoms lasted for about 30 minutes.  She still felt weak, and  her family called EMS, and she came to the emergency room by EMS.   PAST MEDICAL HISTORY:  Significant for hyperlipidemia which has been  treated.  She has no history of hypertension or diabetes.   CURRENT MEDICATIONS:  Lipitor 10 mg daily.   ALLERGIES:  Codeine.   PAST MEDICAL HISTORY:  Significant for hypothyroidism and osteoarthritis.   SOCIAL HISTORY:  She lives at home with her husband and is quite independent  and fairly active.   FAMILY HISTORY:  Her father died in his 8's of coronary disease.  She has  had one brother and one sister who had bypass surgery, and another brother  who had severe coronary disease.  For details of family history, social  history and review of systems, please see Judy Walker's full note.   PHYSICAL EXAMINATION:  VITAL SIGNS:  On examination, blood pressure 160/81,  pulse 67 and regular.  NECK:  There was no venous distention.  Carotid pulses full without bruits.  CHEST:  Clear without rales or rhonchi.  HEART:  Rhythm was regular.  First and second heart sounds were normal.  No  murmurs or gallops.  ABDOMEN:  Soft without organomegaly.  Bowel sounds were normal.  EXTREMITIES:  Show good pulses and no peripheral edema.  NEUROLOGIC:  Examination showed no focal neurological signs.  MUSCULOSKELETAL:  Showed no deformity.  SKIN:  Warm and dry.   EKG was normal.  BUN and creatinine were normal.  Initial troponins and MB's  were normal.   IMPRESSION:  1. Presyncope and chest tightness suggestive of ischemia.  2. Hyperlipidemia.  3. Strong positive family history of coronary artery disease.   RECOMMENDATIONS:  1. We will plan to admit the patient for further evaluation.  2. We will treat her with aspirin, heparin and beta blocker.  3.  Plan to arrange evaluation and coronary angiography probably today.                                               Charlies Constable, M.D. [facility]    BB/MEDQ  D:  11/11/2003  T:  11/12/2003  Job:  [id number]

## 2010-10-22 NOTE — H&P (Signed)
NAME:  Judy Walker, Judy Walker                       ACCOUNT NO.:  [id number]   MEDICAL RECORD NO.:  [id number]                   PATIENT TYPE:  AMB   LOCATION:  DAY                                  FACILITY:  [facility]   PHYSICIAN:  Katherine Roan, M.D.               DATE OF BIRTH:  24-Jun-1932   DATE OF ADMISSION:  DATE OF DISCHARGE:                                HISTORY & PHYSICAL   CHIEF COMPLAINT:  A 77 year old gravida 2, para 2 with bilateral cystic  ovaries and a strong family history of breast and ovarian cancer.  She has  two sisters with ovarian cancer and two sisters with breast cancer.  Her  mammograms were normal and she has presented now for a laparoscopic assisted  BSO and LAVH.   PAST MEDICAL HISTORY:  This is significant only that codeine causes  constipation and she had a cystoscopy at age 81.  She has hypothyroid and  currently on Synthroid 0.05.   REVIEW OF SYSTEMS:  HEENT:  She wears glasses but denies dizziness.  No  decrease in visual or auditory acuity.  No frequent sore throats.  HEART:  No history of hypertension, no chest pain.  Recently she was found to have  an elevated LDL.  LUNGS:  No chronic cough, no asthma.  GU:  She denies  stress incontinence or frequency.  No history of pyelitis.  No history of  nephritis.  GI:  No bowel habit change.  No melena.  MUSCLE, BONES AND  JOINTS:  No fractures or arthritis.   SOCIAL HISTORY:  She is retired.  She drinks alcohol socially and does not  smoke.   FAMILY HISTORY:  Her mother died at age 46 of renal cell carcinoma.  Her  father died of a myocardial infarction at age 62.  She has one brother who  died of an MI and three who are diabetic.  She has two sisters with breast  cancer and two sisters with ovarian cancer.   PHYSICAL EXAMINATION:  GENERAL APPEARANCE:  A well-developed, well-nourished  female who appears to be her stated age of 38.  She is oriented to time,  place and recent events.  HEENT:   Examination of the ears, nose and throat are unremarkable.  Oropharynx is not injected.  NECK:  Supple.  Thyroid is not enlarged.  Carotid pulses are equal.  Trachea  is in the midline.  No adenopathy appreciated.  BREASTS:  No masses or tenderness.  Axilla negative.  LUNGS:  Clear to percussion and auscultation.  CARDIOVASCULAR:  Normal sinus rhythm with no murmurs.  ABDOMEN:  Soft.  Liver, spleen and kidneys are not palpated.  No masses are  noted in the abdomen.  No bruits heard.  Femoral pulses are normal.  EXTREMITIES:  Good range of motion, equal pulses and reflexes.  PELVIC:  She has a well-supported cervix, no pelvic masses are noted.  Rectovaginal confirms.  IMPRESSION:  Cystic ovaries on ultrasound with a strong family history of  ovarian cancer in two sisters, normal CA125.  Laparoscopically assisted  vaginal hysterectomy and bilateral salpingo-oophorectomy is planned.  We  have also discussed the risk of primary peritoneal carcinoma.  She has been  given detailed informed consent of this surgical procedure.                                                Katherine Roan, M.D.    SDM/MEDQ  D:  03/20/2002  T:  03/20/2002  Job:  [id number]

## 2010-10-22 NOTE — Cardiovascular Report (Signed)
NAME:  Judy Walker, Judy Walker                       ACCOUNT NO.:  [id number]   MEDICAL RECORD NO.:  [id number]                   PATIENT TYPE:  INP   LOCATION:  4705                                 FACILITY:  [facility]   PHYSICIAN:  Salvadore Farber, M.D. [facility]         DATE OF BIRTH:  10-26-1932   DATE OF PROCEDURE:  11/11/2003  DATE OF DISCHARGE:  11/12/2003                              CARDIAC CATHETERIZATION   PROCEDURES:  1. Left heart catheterization.  2. Left ventriculography.  3. Coronary angiography.   CARDIOLOGIST:  Salvadore Farber, M.D.   INDICATIONS:  Judy Walker is a 77 year old lady without prior history of  cardiovascular disease.  However, she has a markedly positive family  history.  She presents today after an episode this morning of relatively  abrupt onset chest discomfort, lightheadedness, and nausea.  The first set  of cardiac enzymes is negative and electrocardiogram is normal.  Given her  markedly positive family history and concerning symptoms, she was referred  for coronary angiography.   COMPLICATIONS:  None.   DIAGNOSTIC TECHNIQUE:  Informed consent was obtained.  Under 1% lidocaine  local anesthesia, a 6 French sheath was placed in the right femoral artery  using modified Seldinger technique.  Diagnostic angiography and  ventriculography were performed using the JL4, JR4 and pigtail catheters.  The patient tolerated the procedure well and was transferred to the holding  room in stable condition.  Sheaths will be removed there.   COMPLICATIONS:  None.   FINDINGS:  PRESSURES:  1. LV 157/30/9.  2. Ejection fraction 60% without regional wall motion abnormalities.  3. No aortic stenosis.  4. Mitral regurgitation likely secondary to ectopy with ventriculography.   CORONARY ANGIOGRAPHY:  1. Left main angiographically normal.  2. Left anterior descending:  Moderate size vessel giving rise to a single     large diagonal branch.  It is angiographically normal.  3. Circumflex:  Relatively large vessel giving rise to a single large     branching obtuse marginal.  It is angiographically normal.  4. Right coronary artery:  Technically dominant though small vessel which is     angiographically normal.   IMPRESSION/RECOMMENDATIONS:  Angiographically normal coronary arteries with  normal left ventricular size and systolic function.  Doubt cardiac etiology  to her symptoms.  She will be admitted for monitoring to exclude  arrhythmias.                                               Salvadore Farber, M.D. [facility]    WED/MEDQ  D:  11/11/2003  T:  11/13/2003  Job:  [id number]   cc:   Titus Dubin. Alwyn Ren, M.D. [facility]   Charlies Constable, M.D. [facility]

## 2010-10-22 NOTE — Discharge Summary (Signed)
NAME:  TAHLOR, BERENGUER                       ACCOUNT NO.:  [id number]   MEDICAL RECORD NO.:  [id number]                   PATIENT TYPE:  INP   LOCATION:  4705                                 FACILITY:  [facility]   PHYSICIAN:  Charlies Constable, M.D. [facility]              DATE OF BIRTH:  05-11-1933   DATE OF ADMISSION:  11/11/2003  DATE OF DISCHARGE:  11/12/2003                                 DISCHARGE SUMMARY   PROCEDURES:  1. Cardiac catheterization.  2. Coronary arteriogram.  3. Left ventriculogram.   HOSPITAL COURSE:  Judy Walker is a 77 year old female with no known history  of coronary artery disease.  Approximately 8:15 on the day of admission, she  got up out of bed, began walking, and had onset of dizziness.  The dizziness  became progressively worse to the point that she became presyncopal.  She  initially went to the bathroom but stated she had to get up and lie down on  the floor because she felt like if she did not she would pass out.  Shortly  after the dizziness started, she had onset of left chest pain described as a  pressure or tightness.  She also had nausea with a small amount of vomiting  and diaphoresis.  Her chest pain reached to 4/10.  After about 15 minutes,  she felt slightly better and her husband helped her back to bed.  The entire  duration of her symptoms was approximately 30 minutes.  She has never had  any episodes like this before.  She is generally very active without  symptoms.  She has had some increased stress secondary to her husband's  illness but no noticeable symptoms because of this.  She came to the  emergency room and was admitted for further evaluation and treatment.   Her enzymes were negative for MI, but a cardiac catheterization was felt  indicated, and this was performed on November 11, 2003.  It showed no coronary  artery disease and an EF of 60% without wall motion abnormalities.  There  was no AS.  She had some MR,  but this was felt likely  secondary to ectopy  with injection.  She was admitted overnight for monitoring.   Overnight, she had no significant arrhythmias.  Her symptoms completely  resolved.  It was felt that this may have been positional vertigo.  The next  day, her cath site was without ecchymosis or hematomas.  She was considered  stable for discharge on 11/12/03 with outpatient followup arranged.   LABORATORY VALUES:  Hemoglobin 12.6, hematocrit 36.5, WBCs 5.3, platelets  177, sodium 142, potassium 3.5, chloride 111, CO2 27, BUN 13, creatinine  0.8, glucose 84.  Calcium 8.7, lipid profile is pending at the time of  dictation.   Chest x-ray:  Stable appearance of COPD.  No edema or infiltrate is seen.   DISCHARGE CONDITION:  Improved.   DISCHARGE DIAGNOSES:  1.  Chest pain associated with dizziness, nausea, vomiting, and diaphoresis.     Possibly positional vertigo.  Followup with primary care physician.  2. Status post catheterization this admission with normal coronaries and     normal left ventricular function.  3. Hyperlipidemia.  4. Possible mitral valve prolapse.  Follow up as an outpatient.  5. Family history of coronary artery disease.  6. History of hypothyroidism.  7. Status post hysterectomy.  8. Intolerance to codeine secondary to constipation.   DISCHARGE INSTRUCTIONS:  1. Her activity level is to include no strenuous activities for two days.     She is to call the office for problems with the cath site.  She is to eat     a heart-healthy diet.  She is to follow up with Dr. Alwyn Ren and call for     an appointment.   DISCHARGE MEDICATIONS:  1. Lipitor 10 mg daily.  2. Synthroid 0.05 mg daily.  3. Folic acid 1 tablet daily.  4. Flax seed 1 tablet daily.  5. Multivitamin daily.      Theodore Demark, P.A. [facility]                  Charlies Constable, M.D. [facility]    RB/MEDQ  D:  11/12/2003  T:  11/13/2003  Job:  [id number]   cc:   Charlies Constable, M.D. [facility]   Titus Dubin. Alwyn Ren, M.D. [facility]

## 2010-10-22 NOTE — Op Note (Signed)
NAME:  Judy Walker, Judy Walker                       ACCOUNT NO.:  [id number]   MEDICAL RECORD NO.:  [id number]                   PATIENT TYPE:  AMB   LOCATION:  DAY                                  [facility]ACILITY[facility]  [facility]   PHYSICIAN:  Katherine Roan, M.D.               DATE OF BIRTH:  [birth year]   DATE OF PROCEDURE:  03/22/2002  DATE OF DISCHARGE:                                 OPERATIVE REPORT   PREOPERATIVE DIAGNOSES:  Cystic ovaries, strong family history for ovarian  cancer and two sisters with ovarian cancer.   POSTOPERATIVE DIAGNOSES:  Cystic ovaries, strong family history for ovarian  cancer and two sisters with ovarian cancer. Enterocele.   OPERATION PERFORMED:  Pelvic examination under anesthesia, laparoscopically  assisted vaginal hysterectomy, bilateral salpingo-oophorectomy and  culdoplasty.   DESCRIPTION OF PROCEDURE:  The patient was placed in lithotomy position,  prepped and draped in the usual fashion. A pelvic exam under anesthesia was  accomplished. The uterus was small, normal, no palpable masses were noted.  The Hulka elevator was placed into the uterus and the transverse incision  was made in the abdomen and the infraumbilical region. the abdomen was  distended with carbon dioxide. While this was being accomplished, the  abdomen was marked for trocar insertions. The large trocar was placed into  the umbilical incision. Once the abdomen was distended with 3 liters of CO2,  the Veress needle was inserted using aspiration infusion technique.  Visualization of the pelvis was normal. The liver, gallbladder and diaphragm  surfaces were normal. The appendix was laying right up on the  infundibulopelvic ligament. A second puncture was made in the low midline  and two lateral punctures were made under direct vision. Using the  Seitzinger tripolar forceps and the Endoshears, the appendiceal stump was  freed somewhat from the infundibulopelvic ligament and then I transected  the  infundibulopelvic ligament on the right using the Seitzinger tripolar  forceps as I did the round ligament and the broad ligament. A similar  procedure was accomplished on the left, again both ovaries appeared to be  normal. The broad ligament was handled with the Seitzinger forceps down to  the uterine artery. A bladder flap was created. The bladder was pushed off  the lower segment. Following this, we went down below, circumscribed the  cervix, found that there was a large cul-de-sac defect consistent with early  enterocele and this was first repaired with interrupted sutures of #0 Vicryl  and 3-0 Ethibond. A good reduction of the enterocele was accomplished in the  form of a culdoplasty. The peritoneum was then closed with 2-0 PDS and the  skin was closed with a continuation of the PDS suture. The vault was closed  in the vertical plane.   Then we went above and irrigated. There was a little oozing on the left  pelvic sidewall which I cauterized with the Seitzinger tripolar forceps. I  found  otherwise the pedicles to be hemostatic and secure. The trocar areas  were free of any bleeding. The gas was evacuated and then we removed the  trocars. The umbilical incision was closed with one suture of #0 Vicryl and  then 4-0 PDS for the skin. The incisions were infiltrated with 1/2% Marcaine  with epinephrine. [name] tolerated this procedure well and was sent to  the recovery room in good condition.                                                Katherine Roan, M.D.    SDM/MEDQ  D:  03/22/2002  T:  03/23/2002  Job:  [id number]

## 2011-06-18 DIAGNOSIS — H25099 Other age-related incipient cataract, unspecified eye: Secondary | ICD-10-CM | POA: Diagnosis not present

## 2011-07-04 DIAGNOSIS — H2510 Age-related nuclear cataract, unspecified eye: Secondary | ICD-10-CM | POA: Diagnosis not present

## 2011-07-08 ENCOUNTER — Encounter: Payer: Self-pay | Admitting: Internal Medicine

## 2011-07-08 ENCOUNTER — Ambulatory Visit (INDEPENDENT_AMBULATORY_CARE_PROVIDER_SITE_OTHER): Payer: Medicare Other | Admitting: Internal Medicine

## 2011-07-08 VITALS — BP 130/86 | HR 67 | Temp 97.9°F | Ht 68.0 in | Wt 133.4 lb

## 2011-07-08 DIAGNOSIS — I10 Essential (primary) hypertension: Secondary | ICD-10-CM | POA: Diagnosis not present

## 2011-07-08 DIAGNOSIS — Z0000 Encounter for general adult medical examination without abnormal findings: Secondary | ICD-10-CM | POA: Diagnosis not present

## 2011-07-08 DIAGNOSIS — E785 Hyperlipidemia, unspecified: Secondary | ICD-10-CM

## 2011-07-08 DIAGNOSIS — E039 Hypothyroidism, unspecified: Secondary | ICD-10-CM

## 2011-07-08 DIAGNOSIS — N959 Unspecified menopausal and perimenopausal disorder: Secondary | ICD-10-CM | POA: Diagnosis not present

## 2011-07-08 LAB — CBC WITH DIFFERENTIAL/PLATELET
Basophils Absolute: 0 10*3/uL (ref 0.0–0.1)
Basophils Relative: 0.5 % (ref 0.0–3.0)
HCT: 39.5 % (ref 36.0–46.0)
Hemoglobin: 13.6 g/dL (ref 12.0–15.0)
Lymphs Abs: 1.1 10*3/uL (ref 0.7–4.0)
MCHC: 34.5 g/dL (ref 30.0–36.0)
Monocytes Relative: 9.9 % (ref 3.0–12.0)
Neutro Abs: 2.8 10*3/uL (ref 1.4–7.7)
RBC: 3.94 Mil/uL (ref 3.87–5.11)
RDW: 12.1 % (ref 11.5–14.6)

## 2011-07-08 LAB — BASIC METABOLIC PANEL
CO2: 31 mEq/L (ref 19–32)
GFR: 74 mL/min (ref >60.00)
Glucose, Bld: 84 mg/dL (ref 70–99)
Potassium: 3.8 mEq/L (ref 3.5–5.1)
Sodium: 141 mEq/L (ref 135–145)

## 2011-07-08 LAB — HEPATIC FUNCTION PANEL
AST: 21 U/L (ref 0–37)
Albumin: 4.2 g/dL (ref 3.5–5.2)
Total Protein: 7.3 g/dL (ref 6.0–8.3)

## 2011-07-08 LAB — LIPID PANEL
Cholesterol: 201 mg/dL — ABNORMAL HIGH (ref 0–200)
VLDL: 13.8 mg/dL (ref 0.0–40.0)

## 2011-07-08 LAB — LDL CHOLESTEROL, DIRECT: Direct LDL: 87.7 mg/dL

## 2011-07-08 NOTE — Progress Notes (Signed)
Subjective:    Patient ID: Judy Walker, female    DOB: 02/01/1933, 78 y.o.   MRN: [id number]  HPI Medicare Wellness Visit:  The following psychosocial & medical history were reviewed as required by Medicare.   Social history: caffeine:2 cups coffee , alcohol: RARE ,  tobacco use : never & exercise :no.   Home & personal  safety / fall risk: no issues, activities of daily living: no limitations , seatbelt use :yes , and smoke alarm employment :yes .  Power of Attorney/Living Will status : in place  Vision ( as recorded per Nurse) & Hearing  evaluation :  See exam Orientation :oriented x 3 , memory & recall :good,  math testing: good and mood & affect : normal . Depression / anxiety: denied Travel history : [place] 1972 , immunization status :Shingles needed , transfusion history:  no, and preventive health surveillance ( colonoscopies, BMD , etc as per protocol/ [facility]): Colonoscopy declined; SOC reviewed, Dental care:  due . Chart reviewed &  Updated. Active issues reviewed & addressed.       Review of Systems HYPERTENSION: Disease Monitoring: Blood pressure range- 130/80-85  Chest pain, palpitations- no       Dyspnea- no Medications: Compliance- yes  Lightheadedness,Syncope- no  Edema- no  HYPERLIPIDEMIA: Disease Monitoring: See symptoms for Hypertension Medications: Compliance- yes Abd pain, bowel changes- no  Muscle aches- no  HYPOTHYROIDISM: Her only visual symptoms of vision loss related to cataracts; surgery is planned. She does not have heat or cold intolerance per se; but she is experiencing some Raynaud's phenomena in her fingers with cold exposure.          Objective:   Physical Exam Gen.: Thin but healthy and well-nourished in appearance. Alert, appropriate and cooperative throughout exam.Appears younger than stated age  Head: Normocephalic without obvious abnormalities  Eyes: No corneal or conjunctival inflammation noted. Pupils with decreased  light  reflex.  Extraocular motion intact. Vision grossly normal with lenses. Ears: External  ear exam reveals no significant lesions or deformities. Canals clear .TMs normal. Hearing is grossly normal bilaterally. Nose: External nasal exam reveals no deformity or inflammation. Nasal mucosa are pink and moist. No lesions or exudates noted.   Mouth: Oral mucosa and oropharynx reveal no lesions or exudates. Teeth in good repair. Neck: No deformities, masses, or tenderness noted. Range of motion normal Thyroid small Lungs: Normal respiratory effort; chest expands symmetrically. Lungs are clear to auscultation without rales, wheezes, or increased work of breathing. Heart: Normal rate and rhythm. Normal S1 and S2. No gallop, click, or rub.Grade 1/6 systolic murmur with L carotid radiation. Rare premature Abdomen: Bowel sounds normal; abdomen soft and nontender. No masses, organomegaly or hernias noted.Aorta palpable ; no AAA  Genitalia: to see Gyn   .                                                                                   Musculoskeletal/extremities: No deformity or scoliosis noted of  the thoracic or lumbar spine. No clubbing, cyanosis, edema, or deformity noted. Range of motion  normal .Tone & strength  normal.Joints normal. Nail health  good. Vascular: Carotid, radial artery, dorsalis  pedis and  posterior tibial pulses are full and equal. No bruits present definitely , but murmur radiation as noted. Neurologic: Alert and oriented x3. Deep tendon reflexes symmetrical and normal.         Skin: Intact without suspicious lesions or rashes. Lymph: No cervical, axillary  lymphadenopathy present. Psych: Mood and affect are normal. Normally interactive                                                                                         Assessment & Plan:  #1 Medicare Wellness Exam; criteria met ; data entered #2 Problem List reviewed ; Assessment/ Recommendations made  #3 cataracts; no  contraindication to surgery Plan: see Orders

## 2011-07-08 NOTE — Patient Instructions (Signed)
Preventive Health Care: Exercise  30-45  minutes a day, 3-4 days a week. Walking is especially valuable in preventing Osteoporosis. Eat a low-fat diet with lots of fruits and vegetables, up to 7-9 servings per day. Consume less than 30 grams of sugar per day from foods & drinks with High Fructose Corn Syrup as # 1,2,3 or #4 on label. As per the Standard of Care , screening Colonoscopy recommended @ 50 & every 5-10 years thereafter . More frequent monitor would be dictated by family history or findings @ Colonoscopy  Blood Pressure Goal  Ideally is an AVERAGE < 135/85. This AVERAGE should be calculated from @ least 5-7 BP readings taken @ different times of day on different days of week. You should not respond to isolated BP readings , but rather the AVERAGE for that week  BMD should be monitored every 25 months.Recommended lifestyle interventions to prevent  Osteoporosis include calcium 600 mg twice a day  & vitamin D3 supplementation to keep vitamin  D  level @ least 40-60. The usual vitamin D3 dose is 1000 IU daily; but individual dose is determined by annual vitamin D level monitor.

## 2011-07-20 DIAGNOSIS — IMO0002 Reserved for concepts with insufficient information to code with codable children: Secondary | ICD-10-CM | POA: Diagnosis not present

## 2011-07-20 DIAGNOSIS — H2510 Age-related nuclear cataract, unspecified eye: Secondary | ICD-10-CM | POA: Diagnosis not present

## 2011-07-26 DIAGNOSIS — IMO0002 Reserved for concepts with insufficient information to code with codable children: Secondary | ICD-10-CM | POA: Diagnosis not present

## 2011-07-26 DIAGNOSIS — H2510 Age-related nuclear cataract, unspecified eye: Secondary | ICD-10-CM | POA: Diagnosis not present

## 2011-08-25 ENCOUNTER — Other Ambulatory Visit: Payer: Self-pay | Admitting: Internal Medicine

## 2011-08-25 NOTE — Telephone Encounter (Signed)
Refills for   1-Levothyroxine sodium tab #90 Take 1-tablet by mouth every day  Qty 90 Last fill date 12.12.12  2-Hydrochlorothiazide 12.5mg  cap #90 Take 1-capsule by mouth daily Qty 90 Last filled 12.12.12  3-Losartain Potassium 100mg  tab #30 Take 1-tablet by mouth every day Qty 90  Last filled 12.12.12  4-Atorvastatin Calcium 1-mg tab #90  Take 1-tablet by mouth every night at bed time  Qty 90  Last filled 12.12.12

## 2011-08-26 MED ORDER — HYDROCHLOROTHIAZIDE 12.5 MG PO CAPS: 12.5000 mg | ORAL_CAPSULE | Freq: Every day | ORAL | Status: DC

## 2011-08-26 MED ORDER — ATORVASTATIN CALCIUM 10 MG PO TABS: 10.0000 mg | ORAL_TABLET | Freq: Every day | ORAL | Status: DC

## 2011-08-26 MED ORDER — LEVOTHYROXINE SODIUM 75 MCG PO TABS: 75.0000 ug | ORAL_TABLET | Freq: Every day | ORAL | Status: DC

## 2011-08-26 MED ORDER — LOSARTAN POTASSIUM 100 MG PO TABS: 100.0000 mg | ORAL_TABLET | Freq: Every day | ORAL | Status: DC

## 2011-08-26 NOTE — Telephone Encounter (Signed)
RXs sent.

## 2011-09-05 HISTORY — PX: CATARACT EXTRACTION, BILATERAL: SHX1313

## 2012-03-22 DIAGNOSIS — Z23 Encounter for immunization: Secondary | ICD-10-CM | POA: Diagnosis not present

## 2012-07-12 ENCOUNTER — Encounter: Payer: Self-pay | Admitting: Internal Medicine

## 2012-07-12 ENCOUNTER — Ambulatory Visit (INDEPENDENT_AMBULATORY_CARE_PROVIDER_SITE_OTHER): Payer: Medicare Other | Admitting: Internal Medicine

## 2012-07-12 VITALS — BP 120/84 | HR 72 | Resp 14 | Ht 68.0 in | Wt 146.0 lb

## 2012-07-12 DIAGNOSIS — M949 Disorder of cartilage, unspecified: Secondary | ICD-10-CM

## 2012-07-12 DIAGNOSIS — E785 Hyperlipidemia, unspecified: Secondary | ICD-10-CM | POA: Diagnosis not present

## 2012-07-12 DIAGNOSIS — Z0000 Encounter for general adult medical examination without abnormal findings: Secondary | ICD-10-CM | POA: Diagnosis not present

## 2012-07-12 DIAGNOSIS — I7300 Raynaud's syndrome without gangrene: Secondary | ICD-10-CM | POA: Diagnosis not present

## 2012-07-12 DIAGNOSIS — I10 Essential (primary) hypertension: Secondary | ICD-10-CM | POA: Diagnosis not present

## 2012-07-12 DIAGNOSIS — E039 Hypothyroidism, unspecified: Secondary | ICD-10-CM

## 2012-07-12 DIAGNOSIS — N959 Unspecified menopausal and perimenopausal disorder: Secondary | ICD-10-CM

## 2012-07-12 DIAGNOSIS — R29890 Loss of height: Secondary | ICD-10-CM

## 2012-07-12 DIAGNOSIS — M8580 Other specified disorders of bone density and structure, unspecified site: Secondary | ICD-10-CM

## 2012-07-12 LAB — BASIC METABOLIC PANEL
BUN: 19 mg/dL (ref 6–23)
CO2: 31 mEq/L (ref 19–32)
Calcium: 9.6 mg/dL (ref 8.4–10.5)
Creatinine, Ser: 0.8 mg/dL (ref 0.4–1.2)

## 2012-07-12 LAB — HEPATIC FUNCTION PANEL
Albumin: 4.5 g/dL (ref 3.5–5.2)
Bilirubin, Direct: 0 mg/dL (ref 0.0–0.3)
Total Protein: 8 g/dL (ref 6.0–8.3)

## 2012-07-12 LAB — LIPID PANEL
Cholesterol: 180 mg/dL (ref 0–200)
HDL: 75 mg/dL (ref >39.00)
Total CHOL/HDL Ratio: 2
Triglycerides: 134 mg/dL (ref 0.0–149.0)

## 2012-07-12 MED ORDER — LOSARTAN POTASSIUM 100 MG PO TABS: 100.0000 mg | ORAL_TABLET | Freq: Every day | ORAL | Status: DC

## 2012-07-12 MED ORDER — HYDROCHLOROTHIAZIDE 12.5 MG PO CAPS: 12.5000 mg | ORAL_CAPSULE | Freq: Every day | ORAL | Status: DC

## 2012-07-12 MED ORDER — ATORVASTATIN CALCIUM 10 MG PO TABS: 10.0000 mg | ORAL_TABLET | Freq: Every day | ORAL | Status: DC

## 2012-07-12 MED ORDER — LEVOTHYROXINE SODIUM 75 MCG PO TABS: 75.0000 ug | ORAL_TABLET | Freq: Every day | ORAL | Status: DC

## 2012-07-12 NOTE — Patient Instructions (Addendum)
Preventive Health Care: Exercise  30-45  minutes a day, 3-4 days a week. Walking is especially valuable in preventing Osteoporosis. Eat a low-fat diet with lots of fruits and vegetables, up to 7-9 servings per day. This would eliminate need for vitamin supplements for most individuals. Minimal Blood Pressure Goal= AVERAGE < 140/90;  Ideal is an AVERAGE < 135/85. This AVERAGE should be calculated from @ least 5-7 BP readings taken @ different times of day on different days of week. You should not respond to isolated BP readings , but rather the AVERAGE for that week .Please bring your  blood pressure cuff to office visits to verify that it is reliable.It  can also be checked against the blood pressure device at the pharmacy. Finger or wrist cuffs are not dependable; an arm cuff is.

## 2012-07-12 NOTE — Progress Notes (Signed)
Subjective:    Patient ID: Judy Walker, female    DOB: 1932/10/03, 79 y.o.   MRN: [id number]  HPI Medicare Wellness Visit:  Psychosocial & medical history were reviewed as required by Medicare (abuse,antisocial behavioral risks,firearm risk).  Social history: caffeine: 2 cups of coffee/ day , alcohol:RARELY   ,  tobacco use: never   Exercise :  Walking 1 hr / day No home & personal  safety / fall risk Activities of daily living: no limitations  Seatbelt  and smoke alarm employed. Power of Attorney/Living Will status : in place Ophthalmology exam current Hearing evaluation not current Orientation :oriented X 3  Memory & recall :good Math testing:good Mood & affect : normal . Depression / anxiety: denied Travel history : last in [place] 4- 5 decades ago  Immunization status : Shingles & tetanus needed Transfusion history:  none  Preventive health surveillance ( colonoscopies, BMD , mammograms,PAP as per protocol/ SOC): No colonoscopy to date; [facility] reviewed . Abdominal pain, Judy, or rectal bleeding.  Mammogram  Pending; BMD overdue Dental care:  overdue. Chart reviewed &  Updated. Active issues reviewed & addressed.       Review of Systems There is medical compliance with the statin. Blood pressure  average is  140s/85 with wrist monitor  . There is medical compliance with antihypertensive medications. No  medication adverse effects suggested.  ROS: Constitutional: No  significant weight change,excess fatigue Eyes: No  blurred vision, double vision, or loss of vision Cardiovascular: no chest pain,  racing, ,syncope, claudication, or edema . Occasional irregular rhythm after working in yard if fatigued. Respiratory: No exertional dyspnea, paroxysmal nocturnal dyspnea Genitourinary: No dysuria,hematuria, pyuria, frequency,  incontinence, nocturia Musculoskeletal: No myalgias or muscle cramping  Dermatologic: No non healing  lesions ,change in color or temperature of  skin Neurologic: No  limb weakness. Numbness & tingling in fingertips after cold exposure. Endocrine: No change in hair/skin/ nails, excessive thirst, excessive hunger,or  excessive urination            Objective:   Physical Exam Gen.: Thin but healthy and well-nourished in appearance. Alert, appropriate and cooperative throughout exam. Appears younger than stated age  Head: Normocephalic without obvious abnormalities  Eyes: No corneal or conjunctival inflammation noted. Pupils equal round reactive to light and accommodation.  Extraocular motion intact. Vision grossly normal. Ears: External  ear exam reveals no significant lesions or deformities. Canals clear .TMs normal. Hearing is grossly normal bilaterally. Nose: External nasal exam reveals no deformity or inflammation. Nasal mucosa are pink and moist. No lesions or exudates noted. Mouth: Oral mucosa and oropharynx reveal no lesions or exudates. Teeth in good repair. Neck: No deformities, masses, or tenderness noted. Range of motion decreased. Thyroid normal. Lungs: Normal respiratory effort; chest expands symmetrically. Lungs are clear to auscultation without rales, wheezes, or increased work of breathing. Heart: Normal rate and rhythm. Normal S1 and S2. No gallop, click, or rub. Grade 1/6 systolic murmur with carotid radiation Abdomen: Bowel sounds normal; abdomen soft and nontender. No masses, organomegaly or hernias noted.Aorta palpable ; no AAA  Genitalia: S/P TAH & BSO. Musculoskeletal/extremities: Accentuated curvature of upper thoracic  spine.  No clubbing, cyanosis, edema, or significant extremity  deformity noted. Range of motion normal .Tone & strength  normal.Joints normal . Nail health good. Able to lie down & sit up w/o help. Negative SLR bilaterally Vascular: Carotid, radial artery, dorsalis pedis and  posterior tibial pulses are full and equal. No bruits present. Neurologic: Alert and  oriented x3. Deep tendon reflexes  symmetrical and normal. Rhomberg & finger to nose testing normal.Gait including heel & toe walking normal.        Skin: Intact without suspicious lesions or rashes. Lymph: No cervical, axillary lymphadenopathy present. Psych: Mood and affect are normal. Normally interactive                                                                                     Assessment & Plan:  #1 Medicare Wellness Exam; criteria met ; data entered #2 Problem List reviewed ; Assessment/ Recommendations made  #3 Raynaud's phenomena. Avoidance of cold stressed.   Plan: see Orders

## 2012-07-26 ENCOUNTER — Telehealth: Payer: Self-pay | Admitting: *Deleted

## 2012-07-26 NOTE — Telephone Encounter (Signed)
Pt left VM that she would like to get a call back. No further details given. Left message to call office

## 2012-07-27 DIAGNOSIS — M949 Disorder of cartilage, unspecified: Secondary | ICD-10-CM | POA: Diagnosis not present

## 2012-07-27 DIAGNOSIS — Z803 Family history of malignant neoplasm of breast: Secondary | ICD-10-CM | POA: Diagnosis not present

## 2012-07-27 DIAGNOSIS — Z1231 Encounter for screening mammogram for malignant neoplasm of breast: Secondary | ICD-10-CM | POA: Diagnosis not present

## 2012-07-27 DIAGNOSIS — M899 Disorder of bone, unspecified: Secondary | ICD-10-CM | POA: Diagnosis not present

## 2012-08-02 ENCOUNTER — Encounter: Payer: Self-pay | Admitting: Internal Medicine

## 2012-08-02 DIAGNOSIS — M85859 Other specified disorders of bone density and structure, unspecified thigh: Secondary | ICD-10-CM | POA: Insufficient documentation

## 2012-08-02 DIAGNOSIS — M8580 Other specified disorders of bone density and structure, unspecified site: Secondary | ICD-10-CM | POA: Insufficient documentation

## 2012-08-15 NOTE — Telephone Encounter (Signed)
Letter Mail, tried to call pt no answer phone just rang, no VM.

## 2012-08-23 ENCOUNTER — Encounter: Payer: Self-pay | Admitting: Internal Medicine

## 2012-08-23 ENCOUNTER — Encounter: Payer: Self-pay | Admitting: Lab

## 2012-08-24 ENCOUNTER — Encounter: Payer: Self-pay | Admitting: Internal Medicine

## 2012-08-24 ENCOUNTER — Ambulatory Visit (INDEPENDENT_AMBULATORY_CARE_PROVIDER_SITE_OTHER): Payer: Medicare Other | Admitting: Internal Medicine

## 2012-08-24 VITALS — BP 130/72 | HR 88 | Wt 146.0 lb

## 2012-08-24 DIAGNOSIS — M899 Disorder of bone, unspecified: Secondary | ICD-10-CM | POA: Diagnosis not present

## 2012-08-24 DIAGNOSIS — M949 Disorder of cartilage, unspecified: Secondary | ICD-10-CM

## 2012-08-24 DIAGNOSIS — M8580 Other specified disorders of bone density and structure, unspecified site: Secondary | ICD-10-CM

## 2012-08-24 MED ORDER — ALENDRONATE SODIUM 70 MG PO TABS: 70.0000 mg | ORAL_TABLET | ORAL | Status: DC

## 2012-08-24 NOTE — Assessment & Plan Note (Addendum)
After reviewing the data and images; I recommended generic Fosamax 70 mg once weekly. This recommendation is based on her physiologic, not chronologic status. Vitamin D level will be checked. She should continue her weightbearing exercises. Bone density is recommended every 25 months.

## 2012-08-24 NOTE — Progress Notes (Signed)
  Subjective:    Patient ID: Judy Walker, female    DOB: 07-23-1932, 79 y.o.   MRN: [id number]  HPI  She is here to followup the bone density results. She has significant osteopenia at the total hip on the left with a value of -1.9. The actual images were reviewed; the data are valid. She has had no bone building therapy today  She does walk a mile a day he is very physically active outside in the summer. She is on vitamin D 600 international units daily  Her sister has osteoporosis; she is uncertain as to what medication her sisters taking.      Review of Systems She has no past history of GERD or fractures. She does have some lumbosacral area pain intermittently , mainly with overuse.  She denies any radicular pain down the legs. She has no associated limb weakness or incontinence. She does occasionally have some numbness in the foot which is positional if she is sitting and reading for a while    Objective:   Physical Exam  She is thin and appears much younger than her stated age.  Spine reveals no malalignment or malformation  Joints reveal no significant deformity. Range of motion, strength, and tone are normal.  Deep tendon reflexes are equal and normal  Straight leg raising is negative past 90        Assessment & Plan:

## 2012-08-24 NOTE — Patient Instructions (Addendum)
Review and correct the record as indicated. Please share record with all medical staff seen.  

## 2012-08-28 ENCOUNTER — Telehealth: Payer: Self-pay | Admitting: Internal Medicine

## 2012-08-28 DIAGNOSIS — Z79899 Other long term (current) drug therapy: Secondary | ICD-10-CM | POA: Diagnosis not present

## 2012-08-28 NOTE — Telephone Encounter (Signed)
Spoke with patient, patient with rx at home. Patient will take rx to the pharmacy.

## 2012-08-28 NOTE — Telephone Encounter (Signed)
RX was printed and given to patient at [facility] 08/24/2012, I tried to call patient x 2 (line busy)

## 2012-08-28 NOTE — Telephone Encounter (Signed)
Patient states [facility] pharmacy never received rx for fosamax. Patient would like this sent asap.

## 2012-08-29 ENCOUNTER — Encounter: Payer: Self-pay | Admitting: Internal Medicine

## 2012-08-29 LAB — VITAMIN D 1,25 DIHYDROXY: Vitamin D 1, 25 (OH)2 Total: 50 pg/mL (ref 18–72)

## 2012-09-13 ENCOUNTER — Encounter: Payer: Self-pay | Admitting: Internal Medicine

## 2012-11-21 ENCOUNTER — Telehealth: Payer: Self-pay | Admitting: *Deleted

## 2012-11-21 NOTE — Telephone Encounter (Signed)
Last TSH within normal, refill Synthroid one month and one refill

## 2012-11-21 NOTE — Telephone Encounter (Signed)
Fax from pharmacy stating:We have had to change because this medication due to cost. Please be aware of your levels. We also faxed your doctor to let them know.Please let us know if this is a problem. Please advise

## 2012-11-21 NOTE — Telephone Encounter (Signed)
Discuss with patient  

## 2013-01-05 DIAGNOSIS — S61209A Unspecified open wound of unspecified finger without damage to nail, initial encounter: Secondary | ICD-10-CM | POA: Diagnosis not present

## 2013-01-05 DIAGNOSIS — M79609 Pain in unspecified limb: Secondary | ICD-10-CM | POA: Diagnosis not present

## 2013-01-05 DIAGNOSIS — M65839 Other synovitis and tenosynovitis, unspecified forearm: Secondary | ICD-10-CM | POA: Diagnosis not present

## 2013-01-07 DIAGNOSIS — M79609 Pain in unspecified limb: Secondary | ICD-10-CM | POA: Diagnosis present

## 2013-01-07 DIAGNOSIS — I10 Essential (primary) hypertension: Secondary | ICD-10-CM | POA: Diagnosis not present

## 2013-01-07 DIAGNOSIS — B9689 Other specified bacterial agents as the cause of diseases classified elsewhere: Secondary | ICD-10-CM | POA: Diagnosis not present

## 2013-01-07 DIAGNOSIS — L02519 Cutaneous abscess of unspecified hand: Secondary | ICD-10-CM | POA: Diagnosis not present

## 2013-01-07 DIAGNOSIS — E039 Hypothyroidism, unspecified: Secondary | ICD-10-CM | POA: Diagnosis not present

## 2013-01-07 DIAGNOSIS — S61209A Unspecified open wound of unspecified finger without damage to nail, initial encounter: Secondary | ICD-10-CM | POA: Diagnosis not present

## 2013-01-07 DIAGNOSIS — E785 Hyperlipidemia, unspecified: Secondary | ICD-10-CM | POA: Diagnosis not present

## 2013-01-07 DIAGNOSIS — M810 Age-related osteoporosis without current pathological fracture: Secondary | ICD-10-CM | POA: Diagnosis not present

## 2013-01-07 DIAGNOSIS — L03019 Cellulitis of unspecified finger: Secondary | ICD-10-CM | POA: Diagnosis not present

## 2013-01-07 DIAGNOSIS — R011 Cardiac murmur, unspecified: Secondary | ICD-10-CM | POA: Diagnosis present

## 2013-01-07 DIAGNOSIS — M19049 Primary osteoarthritis, unspecified hand: Secondary | ICD-10-CM | POA: Diagnosis present

## 2013-01-07 DIAGNOSIS — N179 Acute kidney failure, unspecified: Secondary | ICD-10-CM | POA: Diagnosis not present

## 2013-01-15 DIAGNOSIS — L03019 Cellulitis of unspecified finger: Secondary | ICD-10-CM | POA: Diagnosis not present

## 2013-01-15 DIAGNOSIS — L02519 Cutaneous abscess of unspecified hand: Secondary | ICD-10-CM | POA: Diagnosis not present

## 2013-01-25 DIAGNOSIS — L02519 Cutaneous abscess of unspecified hand: Secondary | ICD-10-CM | POA: Diagnosis not present

## 2013-04-02 DIAGNOSIS — Z23 Encounter for immunization: Secondary | ICD-10-CM | POA: Diagnosis not present

## 2013-07-12 ENCOUNTER — Ambulatory Visit (INDEPENDENT_AMBULATORY_CARE_PROVIDER_SITE_OTHER): Payer: Medicare Other | Admitting: Family Medicine

## 2013-07-12 ENCOUNTER — Encounter: Payer: Self-pay | Admitting: Family Medicine

## 2013-07-12 VITALS — BP 152/78 | HR 82 | Temp 98.1°F | Ht 68.0 in | Wt 156.2 lb

## 2013-07-12 DIAGNOSIS — E039 Hypothyroidism, unspecified: Secondary | ICD-10-CM | POA: Diagnosis not present

## 2013-07-12 DIAGNOSIS — E785 Hyperlipidemia, unspecified: Secondary | ICD-10-CM | POA: Diagnosis not present

## 2013-07-12 DIAGNOSIS — M899 Disorder of bone, unspecified: Secondary | ICD-10-CM | POA: Diagnosis not present

## 2013-07-12 DIAGNOSIS — M8580 Other specified disorders of bone density and structure, unspecified site: Secondary | ICD-10-CM

## 2013-07-12 DIAGNOSIS — I10 Essential (primary) hypertension: Secondary | ICD-10-CM

## 2013-07-12 DIAGNOSIS — M949 Disorder of cartilage, unspecified: Secondary | ICD-10-CM | POA: Diagnosis not present

## 2013-07-12 NOTE — Progress Notes (Signed)
Pre-visit discussion using our clinic review tool. No additional management support is needed unless otherwise documented below in the visit note.  

## 2013-07-12 NOTE — Assessment & Plan Note (Signed)
Last dexa 07/2012 - on fosamax 70mg  weekly since early 2014.  Will continue for now.

## 2013-07-12 NOTE — Patient Instructions (Addendum)
Let's keep track of blood pressure at home and let me know if persistently elevated >150/90. Let's work on Designer, fashion/clothing intake. Return at your convenience fasting for blood work and afterwards for wellness exam.

## 2013-07-12 NOTE — Assessment & Plan Note (Signed)
Chronic, stable.  Suggested she change her hctz to am and continue losartan at night. Pt will monitor BP at home and notify me if persistently elevated.

## 2013-07-12 NOTE — Progress Notes (Signed)
BP 152/78  Pulse 82  Temp(Src) 98.1 F (36.7 C) (Oral)  Ht 5\' 8"  (1.727 m)  Wt 156 lb 4 oz (70.875 kg)  BMI 23.76 kg/m2   CC: transfer of care from [facility]  Subjective:    Patient ID: Judy Walker, female    DOB: May 01, 1933, 80 y.o.   MRN: [id number]  HPI: Judy Walker is a 80 y.o. female presenting on 07/12/2013 with Establish Care Transferring care from Dr. Linna Darner.  HTN - Compliant with current antihypertensive regimen of losartan 100mg  and hctz 12.5mg  daily.  Does check blood pressures at home: better than today.  No low blood pressure readings or symptoms of dizziness/syncope.  Denies HA, vision changes, CP/tightness, SOB, leg swelling.   Osteoporosis - complaint with fosamax 70mg  weekly.  Tends to get stuck in throat but able to get down.  Occasionally throws pill back up.  HLD - compliant with lipitor 10mg  nightly  Hypothyroid - on levothyroxine 89mcg daily. Lab Results  Component Value Date   TSH 0.75 07/12/2012   Flu shot - at [facility] zostavax - discussed - may be interested at physical but at pharmacy  Doesn't drive - never has.  Lives alone. Widow 2012 Daughter lives nearby. Occupation: retired Clinical cytogeneticist Activity: Regular exercise - walking 1-2 miles daily, stays active in yard. Diet: daily fruits/vegetables, some water  Relevant past medical, surgical, family and social history reviewed and updated. Allergies and medications reviewed and updated. Current Outpatient Prescriptions on File Prior to Visit  Medication Sig  . alendronate (FOSAMAX) 70 MG tablet Take 1 tablet (70 mg total) by mouth every 7 (seven) days. Take with a full glass of water on an empty stomach.  Marland Kitchen atorvastatin (LIPITOR) 10 MG tablet Take 1 tablet (10 mg total) by mouth daily.  . calcium gluconate 500 MG tablet Take 500 mg by mouth daily.    . Flaxseed, Linseed, (FLAX SEED OIL) 1000 MG CAPS Take by mouth.    . folic acid (FOLVITE) 409 MCG tablet Take 400 mcg by mouth daily.    .  hydrochlorothiazide (MICROZIDE) 12.5 MG capsule Take 1 capsule (12.5 mg total) by mouth daily.  Marland Kitchen levothyroxine (SYNTHROID, LEVOTHROID) 75 MCG tablet Take 1 tablet (75 mcg total) by mouth daily.  Marland Kitchen losartan (COZAAR) 100 MG tablet Take 1 tablet (100 mg total) by mouth daily.  . multivitamin (THERAGRAN) per tablet Take 1 tablet by mouth daily.    Marland Kitchen VITAMIN D, CHOLECALCIFEROL, PO Take by mouth.     No current facility-administered medications on file prior to visit.    Review of Systems Per HPI unless specifically indicated above    Objective:    BP 152/78  Pulse 82  Temp(Src) 98.1 F (36.7 C) (Oral)  Ht 5\' 8"  (1.727 m)  Wt 156 lb 4 oz (70.875 kg)  BMI 23.76 kg/m2  Physical Exam  Nursing note and vitals reviewed. Constitutional: She appears well-developed and well-nourished. No distress.  HENT:  Head: Normocephalic and atraumatic.  Mouth/Throat: Oropharynx is clear and moist. No oropharyngeal exudate.  Eyes: Conjunctivae and EOM are normal. Pupils are equal, round, and reactive to light.  Neck: Normal range of motion. Neck supple.  Cardiovascular: Normal rate, regular rhythm and intact distal pulses.   Murmur (3/6 SEM at LUSB, chronic) heard. Pulmonary/Chest: Effort normal and breath sounds normal. No respiratory distress. She has no wheezes. She has no rales.  Musculoskeletal: She exhibits no edema.  Lymphadenopathy:    She has no cervical adenopathy.  Skin: Skin is warm and dry. No rash noted.  Psychiatric: She has a normal mood and affect.       Assessment & Plan:   Problem List Items Addressed This Visit   HYPERLIPIDEMIA     Chronic, stable. Continue meds.    HYPERTENSION     Chronic, stable.  Suggested she change her hctz to am and continue losartan at night. Pt will monitor BP at home and notify me if persistently elevated.    HYPOTHYROIDISM     Will check TSH when returns fasting for blood work prior to next wellness exam.    Osteopenia - Primary     Last  dexa 07/2012 - on fosamax 70mg  weekly since early 2014.  Will continue for now.        Follow up plan: Return as needed, for annual exam, prior fasting for blood work.

## 2013-07-12 NOTE — Assessment & Plan Note (Signed)
Will check TSH when returns fasting for blood work prior to next wellness exam.

## 2013-07-12 NOTE — Assessment & Plan Note (Signed)
Chronic, stable. Continue meds. 

## 2013-07-15 ENCOUNTER — Telehealth: Payer: Self-pay | Admitting: Family Medicine

## 2013-07-15 NOTE — Telephone Encounter (Signed)
Relevant patient education mailed to patient.  

## 2013-07-18 ENCOUNTER — Encounter: Payer: Medicare Other | Admitting: Internal Medicine

## 2013-07-25 ENCOUNTER — Encounter: Payer: Medicare Other | Admitting: Family Medicine

## 2013-08-08 ENCOUNTER — Encounter: Payer: Self-pay | Admitting: Family Medicine

## 2013-08-08 ENCOUNTER — Ambulatory Visit (INDEPENDENT_AMBULATORY_CARE_PROVIDER_SITE_OTHER): Payer: Medicare Other | Admitting: Family Medicine

## 2013-08-08 VITALS — BP 152/78 | HR 74 | Temp 98.2°F | Ht 67.0 in | Wt 152.5 lb

## 2013-08-08 DIAGNOSIS — M899 Disorder of bone, unspecified: Secondary | ICD-10-CM | POA: Diagnosis not present

## 2013-08-08 DIAGNOSIS — M949 Disorder of cartilage, unspecified: Secondary | ICD-10-CM

## 2013-08-08 DIAGNOSIS — E785 Hyperlipidemia, unspecified: Secondary | ICD-10-CM

## 2013-08-08 DIAGNOSIS — E039 Hypothyroidism, unspecified: Secondary | ICD-10-CM | POA: Diagnosis not present

## 2013-08-08 DIAGNOSIS — Z1211 Encounter for screening for malignant neoplasm of colon: Secondary | ICD-10-CM

## 2013-08-08 DIAGNOSIS — G4700 Insomnia, unspecified: Secondary | ICD-10-CM

## 2013-08-08 DIAGNOSIS — Z0000 Encounter for general adult medical examination without abnormal findings: Secondary | ICD-10-CM | POA: Diagnosis not present

## 2013-08-08 DIAGNOSIS — I10 Essential (primary) hypertension: Secondary | ICD-10-CM

## 2013-08-08 DIAGNOSIS — Z23 Encounter for immunization: Secondary | ICD-10-CM

## 2013-08-08 DIAGNOSIS — M8580 Other specified disorders of bone density and structure, unspecified site: Secondary | ICD-10-CM

## 2013-08-08 LAB — LIPID PANEL
CHOL/HDL RATIO: 3
Cholesterol: 156 mg/dL (ref 0–200)
HDL: 55.5 mg/dL (ref >39.00)
LDL Cholesterol: 81 mg/dL (ref 0–99)
Triglycerides: 97 mg/dL (ref 0.0–149.0)
VLDL: 19.4 mg/dL (ref 0.0–40.0)

## 2013-08-08 LAB — BASIC METABOLIC PANEL
BUN: 19 mg/dL (ref 6–23)
CALCIUM: 9.2 mg/dL (ref 8.4–10.5)
CO2: 28 meq/L (ref 19–32)
Chloride: 103 mEq/L (ref 96–112)
Creatinine, Ser: 0.8 mg/dL (ref 0.4–1.2)
GFR: 70.54 mL/min (ref >60.00)
Glucose, Bld: 84 mg/dL (ref 70–99)
Potassium: 3.6 mEq/L (ref 3.5–5.1)
SODIUM: 138 meq/L (ref 135–145)

## 2013-08-08 LAB — TSH: TSH: 2.68 u[IU]/mL (ref 0.35–5.50)

## 2013-08-08 MED ORDER — HYDROCHLOROTHIAZIDE 25 MG PO TABS: 25.0000 mg | ORAL_TABLET | Freq: Every day | ORAL | Status: DC

## 2013-08-08 MED ORDER — ATORVASTATIN CALCIUM 10 MG PO TABS: 10.0000 mg | ORAL_TABLET | Freq: Every day | ORAL | Status: DC

## 2013-08-08 MED ORDER — ZOSTER VACCINE LIVE 19400 UNT/0.65ML ~~LOC~~ SOLR: 0.6500 mL | Freq: Once | SUBCUTANEOUS | Status: DC

## 2013-08-08 MED ORDER — ALENDRONATE SODIUM 70 MG PO TABS: 70.0000 mg | ORAL_TABLET | ORAL | Status: DC

## 2013-08-08 MED ORDER — LOSARTAN POTASSIUM 100 MG PO TABS: 100.0000 mg | ORAL_TABLET | Freq: Every day | ORAL | Status: DC

## 2013-08-08 MED ORDER — LEVOTHYROXINE SODIUM 75 MCG PO TABS: 75.0000 ug | ORAL_TABLET | Freq: Every day | ORAL | Status: DC

## 2013-08-08 NOTE — Assessment & Plan Note (Signed)
Check TSH today, continue levothryxoine.

## 2013-08-08 NOTE — Assessment & Plan Note (Signed)
Mild since husband's death. Both sleep initiation and sleep maintenance. rec trial of benadryl. Reviewed sleep hygiene measures.

## 2013-08-08 NOTE — Assessment & Plan Note (Addendum)
I have personally reviewed the Medicare Annual Wellness questionnaire and have noted 1. The patient's medical and social history 2. Their use of alcohol, tobacco or illicit drugs 3. Their current medications and supplements 4. The patient's functional ability including ADL's, fall risks, home safety risks and hearing or visual impairment. 5. Diet and physical activity 6. Evidence for depression or mood disorders The patients weight, height, BMI have been recorded in the chart.  Hearing and vision has been addressed. I have made referrals, counseling and provided education to the patient based review of the above and I have provided the pt with a written personalized care plan for preventive services. See scanned questionairre. Advanced directives discussed: has at home  Reviewed preventative protocols and updated unless pt declined.  prevnar today. iFOB today.

## 2013-08-08 NOTE — Progress Notes (Signed)
Pre visit review using our clinic review tool, if applicable. No additional management support is needed unless otherwise documented below in the visit note. 

## 2013-08-08 NOTE — Addendum Note (Signed)
Addended by: Tammi Sou on: 08/08/2013 12:20 PM   Modules accepted: Orders

## 2013-08-08 NOTE — Progress Notes (Signed)
BP 152/78  Pulse 74  Temp(Src) 98.2 F (36.8 C) (Oral)  Ht 5\' 7"  (1.702 m)  Wt 152 lb 8 oz (69.174 kg)  BMI 23.88 kg/m2  SpO2 94%   CC: medicare wellness visit  Subjective:    Patient ID: Judy Walker, female    DOB: [birth year], 80 y.o.   MRN: [id number]  HPI: Judy Walker is a 80 y.o. female presenting on 08/08/2013 with Annual Exam   Trouble sleeping for last 3 years - since husband passed away.  No anxiety.  Both falling and staying asleep.  Bedtime 10pm, brushes teeth, no daytime napping.  Calm quiet dark environment.  Nocturia x1.  Has tried melatonin in past but this didn't help.  Has not tried benadryl.  HTN - Compliant with current antihypertensive regimen of losartan 100mg  daily, and hctz 12.5 mg daily.  No low blood pressure readings or symptoms of dizziness/syncope.  Denies HA, vision changes, CP/tightness, SOB, leg swelling.  Brings log of sugars - 135-178-70-80s, averaging 140s. BP Readings from Last 3 Encounters:  08/08/13 152/78  07/12/13 152/78  08/24/12 130/72   Wt Readings from Last 3 Encounters:  08/08/13 152 lb 8 oz (69.174 kg)  07/12/13 156 lb 4 oz (70.875 kg)  08/24/12 146 lb (66.225 kg)   Hearing screen failed - pt denies trouble hearing Vision screen to be done with ophtho. No falls in last year.    Sunscreen use discussed.  No sunburns in last year.  No suspicious moles Seatbelt use discussed. Preventative: Colon cancer screening - iFOB today, does not want colonoscopy. Well woman - hysterectomy 2003, ovarian cysts. Mammogram - upcoming this month.  Does self breast exams at home, no skin changes.  Sister with breast cancer.  Asks about 3D. Flu shot - at [facility] - discussed - would like script sent to pharmacy Td 2012 Pneumovax 2006, prevnar today Advanced directives - has at home.  HCPOA is son and daughter.  Doesn't drive - never has.  Lives alone. Widow 2012  Daughter lives nearby.  Occupation: retired Clinical cytogeneticist    Activity: Regular exercise - walking 1-2 miles daily, stays active in yard.  Diet: daily fruits/vegetables, some water   Relevant past medical, surgical, family and social history reviewed and updated as indicated.  Allergies and medications reviewed and updated. Current Outpatient Prescriptions on File Prior to Visit  Medication Sig  . calcium gluconate 500 MG tablet Take 500 mg by mouth daily.    . Flaxseed, Linseed, (FLAX SEED OIL) 1000 MG CAPS Take by mouth.    . folic acid (FOLVITE) 834 MCG tablet Take 400 mcg by mouth daily.    . multivitamin (THERAGRAN) per tablet Take 1 tablet by mouth daily.    Marland Kitchen VITAMIN D, CHOLECALCIFEROL, PO Take by mouth.     No current facility-administered medications on file prior to visit.    Review of Systems  Constitutional: Negative for fever, chills, activity change, appetite change, fatigue and unexpected weight change.  HENT: Negative for hearing loss.   Eyes: Negative for visual disturbance.  Respiratory: Negative for cough, chest tightness, shortness of breath and wheezing.   Cardiovascular: Negative for chest pain, palpitations and leg swelling.  Gastrointestinal: Negative for nausea, vomiting, abdominal pain, diarrhea, constipation, blood in stool and abdominal distention.  Genitourinary: Negative for hematuria and difficulty urinating.  Musculoskeletal: Negative for arthralgias, myalgias and neck pain.  Skin: Negative for rash.  Neurological: Negative for dizziness, seizures, syncope and headaches.  Hematological:  Negative for adenopathy. Does not bruise/bleed easily.  Psychiatric/Behavioral: Negative for dysphoric mood. The patient is not nervous/anxious.    Per HPI unless specifically indicated above    Objective:    BP 152/78  Pulse 74  Temp(Src) 98.2 F (36.8 C) (Oral)  Ht 5\' 7"  (1.702 m)  Wt 152 lb 8 oz (69.174 kg)  BMI 23.88 kg/m2  SpO2 94%  Physical Exam  Nursing note and vitals reviewed. Constitutional: She is oriented  to person, place, and time. She appears well-developed and well-nourished. No distress.  HENT:  Head: Normocephalic and atraumatic.  Right Ear: Hearing, tympanic membrane, external ear and ear canal normal.  Left Ear: Hearing, tympanic membrane, external ear and ear canal normal.  Nose: Nose normal.  Mouth/Throat: Uvula is midline, oropharynx is clear and moist and mucous membranes are normal. No oropharyngeal exudate, posterior oropharyngeal edema or posterior oropharyngeal erythema.  Eyes: Conjunctivae and EOM are normal. Pupils are equal, round, and reactive to light. No scleral icterus.  Neck: Normal range of motion. Neck supple.  Cardiovascular: Normal rate, regular rhythm, normal heart sounds and intact distal pulses.   No murmur heard. Pulses:      Radial pulses are 2+ on the right side, and 2+ on the left side.  Pulmonary/Chest: Effort normal and breath sounds normal. No respiratory distress. She has no wheezes. She has no rales.  Abdominal: Soft. Bowel sounds are normal. She exhibits no distension and no mass. There is no tenderness. There is no rebound and no guarding.  Genitourinary:  Deferred   Musculoskeletal: Normal range of motion. She exhibits no edema.  Lymphadenopathy:    She has no cervical adenopathy.  Neurological: She is alert and oriented to person, place, and time.  CN grossly intact, station and gait intact 3/3 recall 5/5 concentration serial 7s  Skin: Skin is warm and dry. No rash noted.  Psychiatric: She has a normal mood and affect. Her behavior is normal. Judgment and thought content normal.       Assessment & Plan:   Problem List Items Addressed This Visit   HYPERLIPIDEMIA     Check FLP today.    Relevant Medications      hydrochlorothiazide tablet      atorvastatin (LIPITOR) tablet      losartan (COZAAR) tablet   Other Relevant Orders      Lipid panel   HYPERTENSION     Chronic, uncontrolled. Increase hctz to 25mg  daily.  Check cr and k  today.    Relevant Orders      Basic metabolic panel   HYPOTHYROIDISM     Check TSH today, continue levothryxoine.    Relevant Medications      levothyroxine (SYNTHROID, LEVOTHROID) tablet   Other Relevant Orders      TSH   Insomnia     Mild since husband's death. Both sleep initiation and sleep maintenance. rec trial of benadryl. Reviewed sleep hygiene measures.    Medicare annual wellness visit, subsequent - Primary     I have personally reviewed the Medicare Annual Wellness questionnaire and have noted 1. The patient's medical and social history 2. Their use of alcohol, tobacco or illicit drugs 3. Their current medications and supplements 4. The patient's functional ability including ADL's, fall risks, home safety risks and hearing or visual impairment. 5. Diet and physical activity 6. Evidence for depression or mood disorders The patients weight, height, BMI have been recorded in the chart.  Hearing and vision has been addressed. I have  made referrals, counseling and provided education to the patient based review of the above and I have provided the pt with a written personalized care plan for preventive services. See scanned questionairre. Advanced directives discussed: has at home  Reviewed preventative protocols and updated unless pt declined.  prevnar today. iFOB today.    Osteopenia     On foxamax weekly since early 2014.  Continue, along with cal/vit D    Relevant Medications      alendronate (FOSAMAX) 70 MG tablet    Other Visit Diagnoses   Special screening for malignant neoplasms, colon        Relevant Orders       Fecal occult blood, imunochemical        Follow up plan: Return in about 1 year (around 08/09/2014), or as needed, for annual exam, prior fasting for blood work.

## 2013-08-08 NOTE — Assessment & Plan Note (Signed)
Check FLP today. 

## 2013-08-08 NOTE — Assessment & Plan Note (Signed)
On foxamax weekly since early 2014.  Continue, along with cal/vit D

## 2013-08-08 NOTE — Patient Instructions (Addendum)
prevnar today Blood work today. I've refilled meds today. Try benadryl at night time for sleep. Return as needed or in 1 year for next wellness exam.

## 2013-08-08 NOTE — Assessment & Plan Note (Signed)
Chronic, uncontrolled. Increase hctz to 25mg  daily.  Check cr and k today.

## 2013-08-09 ENCOUNTER — Telehealth: Payer: Self-pay | Admitting: Family Medicine

## 2013-08-09 NOTE — Telephone Encounter (Signed)
emmi report mailed to patient ° °

## 2013-08-12 ENCOUNTER — Encounter: Payer: Self-pay | Admitting: *Deleted

## 2013-08-27 ENCOUNTER — Encounter: Payer: Medicare Other | Admitting: Family Medicine

## 2013-08-30 ENCOUNTER — Other Ambulatory Visit: Payer: Medicare Other

## 2013-08-30 DIAGNOSIS — Z1211 Encounter for screening for malignant neoplasm of colon: Secondary | ICD-10-CM

## 2013-08-30 LAB — FECAL OCCULT BLOOD, IMMUNOCHEMICAL: FECAL OCCULT BLD: POSITIVE — AB

## 2013-09-02 ENCOUNTER — Other Ambulatory Visit: Payer: Self-pay | Admitting: Family Medicine

## 2013-09-03 ENCOUNTER — Encounter: Payer: Self-pay | Admitting: Family Medicine

## 2013-09-03 ENCOUNTER — Other Ambulatory Visit: Payer: Self-pay | Admitting: Family Medicine

## 2013-09-03 DIAGNOSIS — R195 Other fecal abnormalities: Secondary | ICD-10-CM

## 2013-10-10 ENCOUNTER — Encounter: Payer: Self-pay | Admitting: Internal Medicine

## 2013-12-04 ENCOUNTER — Ambulatory Visit: Payer: Medicare Other | Admitting: Internal Medicine

## 2014-04-13 DIAGNOSIS — Z23 Encounter for immunization: Secondary | ICD-10-CM | POA: Diagnosis not present

## 2014-09-04 ENCOUNTER — Other Ambulatory Visit: Payer: Self-pay | Admitting: Family Medicine

## 2014-09-04 ENCOUNTER — Encounter: Payer: Self-pay | Admitting: Family Medicine

## 2014-09-04 ENCOUNTER — Ambulatory Visit (INDEPENDENT_AMBULATORY_CARE_PROVIDER_SITE_OTHER): Payer: Medicare Other | Admitting: Family Medicine

## 2014-09-04 VITALS — BP 150/80 | HR 64 | Temp 97.6°F | Ht 67.75 in | Wt 158.0 lb

## 2014-09-04 DIAGNOSIS — G4700 Insomnia, unspecified: Secondary | ICD-10-CM

## 2014-09-04 DIAGNOSIS — Z0000 Encounter for general adult medical examination without abnormal findings: Secondary | ICD-10-CM

## 2014-09-04 DIAGNOSIS — M8580 Other specified disorders of bone density and structure, unspecified site: Secondary | ICD-10-CM

## 2014-09-04 DIAGNOSIS — E785 Hyperlipidemia, unspecified: Secondary | ICD-10-CM | POA: Diagnosis not present

## 2014-09-04 DIAGNOSIS — E039 Hypothyroidism, unspecified: Secondary | ICD-10-CM

## 2014-09-04 DIAGNOSIS — I10 Essential (primary) hypertension: Secondary | ICD-10-CM | POA: Diagnosis not present

## 2014-09-04 DIAGNOSIS — R195 Other fecal abnormalities: Secondary | ICD-10-CM

## 2014-09-04 DIAGNOSIS — Z7189 Other specified counseling: Secondary | ICD-10-CM | POA: Insufficient documentation

## 2014-09-04 LAB — BASIC METABOLIC PANEL
BUN: 19 mg/dL (ref 6–23)
CALCIUM: 9.6 mg/dL (ref 8.4–10.5)
CHLORIDE: 102 meq/L (ref 96–112)
CO2: 31 meq/L (ref 19–32)
Creatinine, Ser: 0.86 mg/dL (ref 0.40–1.20)
GFR: 67.52 mL/min (ref >60.00)
GLUCOSE: 98 mg/dL (ref 70–99)
POTASSIUM: 4.2 meq/L (ref 3.5–5.1)
SODIUM: 139 meq/L (ref 135–145)

## 2014-09-04 LAB — HEPATIC FUNCTION PANEL
ALBUMIN: 4.3 g/dL (ref 3.5–5.2)
ALT: 15 U/L (ref 0–35)
AST: 18 U/L (ref 0–37)
Alkaline Phosphatase: 51 U/L (ref 39–117)
Bilirubin, Direct: 0.1 mg/dL (ref 0.0–0.3)
Total Bilirubin: 0.7 mg/dL (ref 0.2–1.2)
Total Protein: 7.3 g/dL (ref 6.0–8.3)

## 2014-09-04 LAB — LIPID PANEL
CHOLESTEROL: 179 mg/dL (ref 0–200)
HDL: 60.5 mg/dL (ref >39.00)
LDL Cholesterol: 89 mg/dL (ref 0–99)
NonHDL: 118.5
Total CHOL/HDL Ratio: 3
Triglycerides: 147 mg/dL (ref 0.0–149.0)
VLDL: 29.4 mg/dL (ref 0.0–40.0)

## 2014-09-04 LAB — TSH: TSH: 3.22 u[IU]/mL (ref 0.35–4.50)

## 2014-09-04 MED ORDER — AMLODIPINE BESYLATE 2.5 MG PO TABS
2.5000 mg | ORAL_TABLET | Freq: Every day | ORAL | Status: DC
Start: 2014-09-04 — End: 2015-08-24

## 2014-09-04 NOTE — Assessment & Plan Note (Signed)
Check TSH today

## 2014-09-04 NOTE — Assessment & Plan Note (Signed)
Discussed with patient - recommended colonoscopy referral, pt declines, aware this is best way to reassure after abnormal stool kit last year. Pt requests rpt iFOB then would consider referral back to GI.

## 2014-09-04 NOTE — Assessment & Plan Note (Signed)
Continue calcium/vit D.

## 2014-09-04 NOTE — Assessment & Plan Note (Signed)
Check FLP today. Continue lipitor.

## 2014-09-04 NOTE — Assessment & Plan Note (Addendum)
Advanced directives - has at home. HCPOA is son and daughter. Asked to bring [place] copy.

## 2014-09-04 NOTE — Patient Instructions (Addendum)
Start aspirin 18m daily and amlodipine 2.560min the morning as well. Schedule mammogram as you're due. Pass by lab for stool kit. Blood work today. [place] living will at home. Return as needed or in 1 year for next medicare wellness visit

## 2014-09-04 NOTE — Assessment & Plan Note (Signed)
Chronic, stable. Continue hctz and losartan, add amlodipine 2.5mg  daily.

## 2014-09-04 NOTE — Progress Notes (Signed)
BP 150/80 mmHg  Pulse 64  Temp(Src) 97.6 F (36.4 C) (Oral)  Ht 5' 7.75" (1.721 m)  Wt 158 lb (71.668 kg)  BMI 24.20 kg/m2   CC: medicare wellness visit  Subjective:    Patient ID: Judy Walker, female    DOB: 09-21-1932, 81 y.o.   MRN: [id number]  HPI: Judy Walker is a 81 y.o. female presenting on 09/04/2014 for Medicare Wellness   bp at home running 140/80s. Strong fmhx CAD and stroke. Not on aspirin daily.  Hearing screen passed. Vision screen passed No falls in last year.  Denies depression.  Preventative: Colon cancer screening - Prior had refused colonoscopy. iFOB positive last year. No h/o hemorrhoids. We referred to Dr Carlean Purl per pt preference. No fmhx colon cancer. Pt denies - requests rpt iFOB.  Well woman - hysterectomy 2003, ovarian cysts. Mammogram - needs to schedule. Sister with breast cancer. Flu shot - at [facility] 2012.  Pneumovax 2006, prevnar 2016.  zostavax - done 09/2013.  Advanced directives - has at home. HCPOA is son and daughter. Asked to bring [place] copy. Sunscreen use discussed. No sunburns in last year. No suspicious moles.  Seatbelt use discussed.  Doesn't drive - never has.  Lives alone. Widow 2012  Daughter lives nearby.  Occupation: retired Clinical cytogeneticist  Activity: Regular exercise - walking 1-2 miles daily, stays active in yard.  Diet: daily fruits/vegetables, some water.   Relevant past medical, surgical, family and social history reviewed and updated as indicated. Interim medical history since our last visit reviewed. Allergies and medications reviewed and updated. Current Outpatient Prescriptions on File Prior to Visit  Medication Sig  . alendronate (FOSAMAX) 70 MG tablet Take 1 tablet (70 mg total) by mouth every 7 (seven) days. Take with a full glass of water on an empty stomach.  Marland Kitchen atorvastatin (LIPITOR) 10 MG tablet Take 1 tablet (10 mg total) by mouth daily.  . calcium gluconate 500 MG tablet Take 500 mg by  mouth daily.    . diphenhydrAMINE (BENADRYL) 25 mg capsule Take 12.5-25 mg by mouth at bedtime as needed for sleep.  . Flaxseed, Linseed, (FLAX SEED OIL) 1000 MG CAPS Take by mouth.    . folic acid (FOLVITE) 707 MCG tablet Take 400 mcg by mouth daily.    . hydrochlorothiazide (HYDRODIURIL) 25 MG tablet Take 1 tablet (25 mg total) by mouth daily.  Marland Kitchen levothyroxine (SYNTHROID, LEVOTHROID) 75 MCG tablet Take 1 tablet (75 mcg total) by mouth daily.  Marland Kitchen losartan (COZAAR) 100 MG tablet Take 1 tablet (100 mg total) by mouth daily.  . multivitamin (THERAGRAN) per tablet Take 1 tablet by mouth daily.    Marland Kitchen VITAMIN D, CHOLECALCIFEROL, PO Take by mouth.     No current facility-administered medications on file prior to visit.    Review of Systems Per HPI unless specifically indicated above     Objective:    BP 150/80 mmHg  Pulse 64  Temp(Src) 97.6 F (36.4 C) (Oral)  Ht 5' 7.75" (1.721 m)  Wt 158 lb (71.668 kg)  BMI 24.20 kg/m2  Wt Readings from Last 3 Encounters:  09/04/14 158 lb (71.668 kg)  08/08/13 152 lb 8 oz (69.174 kg)  07/12/13 156 lb 4 oz (70.875 kg)    Physical Exam  Constitutional: She is oriented to person, place, and time. She appears well-developed and well-nourished. No distress.  HENT:  Head: Normocephalic and atraumatic.  Right Ear: Hearing, tympanic membrane, external ear and ear canal  normal.  Left Ear: Hearing, tympanic membrane, external ear and ear canal normal.  Nose: Nose normal.  Mouth/Throat: Uvula is midline, oropharynx is clear and moist and mucous membranes are normal. No oropharyngeal exudate, posterior oropharyngeal edema or posterior oropharyngeal erythema.  Eyes: Conjunctivae and EOM are normal. Pupils are equal, round, and reactive to light. No scleral icterus.  Neck: Normal range of motion. Neck supple. Carotid bruit is present (radiation of aortic murmur). No thyromegaly present.  Cardiovascular: Normal rate, regular rhythm and intact distal pulses.     Murmur (SEM 3/6 best at LUSB) heard. Pulses:      Radial pulses are 2+ on the right side, and 2+ on the left side.  Pulmonary/Chest: Effort normal and breath sounds normal. No respiratory distress. She has no wheezes. She has no rales. Right breast exhibits no inverted nipple, no mass, no nipple discharge, no skin change and no tenderness. Left breast exhibits no inverted nipple, no mass, no nipple discharge, no skin change and no tenderness.  Abdominal: Soft. Bowel sounds are normal. She exhibits no distension and no mass. There is no tenderness. There is no rebound and no guarding.  Musculoskeletal: Normal range of motion. She exhibits no edema.  Lymphadenopathy:    She has no cervical adenopathy.  Neurological: She is alert and oriented to person, place, and time.  CN grossly intact, station and gait intact Recall 3/3 calculation 4/5 serial 7s  Skin: Skin is warm and dry. No rash noted.  Psychiatric: She has a normal mood and affect. Her behavior is normal. Judgment and thought content normal.  Nursing note and vitals reviewed.  Results for orders placed or performed in visit on 08/30/13  Fecal occult blood, imunochemical  Result Value Ref Range   Fecal Occult Bld Positive (A) Negative      Assessment & Plan:   Problem List Items Addressed This Visit    Osteopenia    Continue calcium/vit D.      Medicare annual wellness visit, subsequent - Primary    I have personally reviewed the Medicare Annual Wellness questionnaire and have noted 1. The patient's medical and social history 2. Their use of alcohol, tobacco or illicit drugs 3. Their current medications and supplements 4. The patient's functional ability including ADL's, fall risks, home safety risks and hearing or visual impairment. 5. Diet and physical activity 6. Evidence for depression or mood disorders The patients weight, height, BMI have been recorded in the chart.  Hearing and vision has been addressed. I have made  referrals, counseling and provided education to the patient based review of the above and I have provided the pt with a written personalized care plan for preventive services. Provider list updated - see scanned questionairre. Reviewed preventative protocols and updated unless pt declined.       Insomnia    Doing well on benadryl.      Hypothyroidism    Check TSH today.      Relevant Orders   TSH   HLD (hyperlipidemia)    Check FLP today. Continue lipitor.      Relevant Medications   aspirin EC 81 MG tablet   amLODIpine (NORVASC)  tablet   Other Relevant Orders   Lipid panel   Hepatic function panel   Heme positive stool    Discussed with patient - recommended colonoscopy referral, pt declines, aware this is best way to reassure after abnormal stool kit last year. Pt requests rpt iFOB then would consider referral back to GI.  Relevant Orders   Fecal occult blood, imunochemical   Essential hypertension    Chronic, stable. Continue hctz and losartan, add amlodipine 2.34m daily.      Relevant Medications   aspirin EC 81 MG tablet   amLODIpine (NORVASC)  tablet   Other Relevant Orders   Basic metabolic panel   Advanced care planning/counseling discussion    Advanced directives - has at home. HCPOA is son and daughter. Asked to [place].          Follow up plan: Return in about 1 year (around 09/04/2015), or as needed, for annual exam, prior fasting for blood work.

## 2014-09-04 NOTE — Progress Notes (Signed)
Pre visit review using our clinic review tool, if applicable. No additional management support is needed unless otherwise documented below in the visit note. 

## 2014-09-04 NOTE — Assessment & Plan Note (Signed)
Doing well on benadryl.

## 2014-09-04 NOTE — Assessment & Plan Note (Signed)

## 2014-09-05 ENCOUNTER — Encounter: Payer: Self-pay | Admitting: *Deleted

## 2014-09-18 DIAGNOSIS — Z803 Family history of malignant neoplasm of breast: Secondary | ICD-10-CM | POA: Diagnosis not present

## 2014-09-18 DIAGNOSIS — Z1231 Encounter for screening mammogram for malignant neoplasm of breast: Secondary | ICD-10-CM | POA: Diagnosis not present

## 2014-09-18 LAB — HM MAMMOGRAPHY: HM MAMMO: NORMAL

## 2014-09-22 ENCOUNTER — Encounter: Payer: Self-pay | Admitting: Family Medicine

## 2014-09-23 ENCOUNTER — Encounter: Payer: Self-pay | Admitting: *Deleted

## 2014-09-29 ENCOUNTER — Other Ambulatory Visit: Payer: Medicare Other

## 2014-09-29 DIAGNOSIS — R195 Other fecal abnormalities: Secondary | ICD-10-CM

## 2014-09-29 LAB — FECAL OCCULT BLOOD, IMMUNOCHEMICAL: Fecal Occult Bld: NEGATIVE

## 2015-03-27 DIAGNOSIS — Z23 Encounter for immunization: Secondary | ICD-10-CM | POA: Diagnosis not present

## 2015-08-24 ENCOUNTER — Other Ambulatory Visit: Payer: Self-pay | Admitting: Family Medicine

## 2015-09-10 ENCOUNTER — Encounter: Payer: Self-pay | Admitting: Family Medicine

## 2015-09-10 ENCOUNTER — Ambulatory Visit (INDEPENDENT_AMBULATORY_CARE_PROVIDER_SITE_OTHER): Payer: Medicare Other | Admitting: Family Medicine

## 2015-09-10 VITALS — BP 130/82 | HR 84 | Temp 97.6°F | Ht 67.75 in | Wt 158.8 lb

## 2015-09-10 DIAGNOSIS — I10 Essential (primary) hypertension: Secondary | ICD-10-CM

## 2015-09-10 DIAGNOSIS — E785 Hyperlipidemia, unspecified: Secondary | ICD-10-CM

## 2015-09-10 DIAGNOSIS — Z7189 Other specified counseling: Secondary | ICD-10-CM

## 2015-09-10 DIAGNOSIS — Z0000 Encounter for general adult medical examination without abnormal findings: Secondary | ICD-10-CM

## 2015-09-10 DIAGNOSIS — E039 Hypothyroidism, unspecified: Secondary | ICD-10-CM

## 2015-09-10 DIAGNOSIS — R195 Other fecal abnormalities: Secondary | ICD-10-CM

## 2015-09-10 DIAGNOSIS — M8580 Other specified disorders of bone density and structure, unspecified site: Secondary | ICD-10-CM

## 2015-09-10 LAB — BASIC METABOLIC PANEL
BUN: 20 mg/dL (ref 6–23)
CALCIUM: 9.7 mg/dL (ref 8.4–10.5)
CHLORIDE: 101 meq/L (ref 96–112)
CO2: 30 mEq/L (ref 19–32)
CREATININE: 0.86 mg/dL (ref 0.40–1.20)
GFR: 67.35 mL/min (ref >60.00)
GLUCOSE: 99 mg/dL (ref 70–99)
POTASSIUM: 3.7 meq/L (ref 3.5–5.1)
Sodium: 140 mEq/L (ref 135–145)

## 2015-09-10 LAB — CBC WITH DIFFERENTIAL/PLATELET
BASOS PCT: 0.8 % (ref 0.0–3.0)
Basophils Absolute: 0 10*3/uL (ref 0.0–0.1)
EOS ABS: 0.2 10*3/uL (ref 0.0–0.7)
EOS PCT: 3.7 % (ref 0.0–5.0)
HCT: 40.5 % (ref 36.0–46.0)
HEMOGLOBIN: 13.6 g/dL (ref 12.0–15.0)
Lymphocytes Relative: 26.6 % (ref 12.0–46.0)
Lymphs Abs: 1.4 10*3/uL (ref 0.7–4.0)
MCHC: 33.6 g/dL (ref 30.0–36.0)
MCV: 100.3 fl — ABNORMAL HIGH (ref 78.0–100.0)
MONO ABS: 0.5 10*3/uL (ref 0.1–1.0)
Monocytes Relative: 10.4 % (ref 3.0–12.0)
NEUTROS ABS: 3.1 10*3/uL (ref 1.4–7.7)
Neutrophils Relative %: 58.5 % (ref 43.0–77.0)
PLATELETS: 250 10*3/uL (ref 150.0–400.0)
RBC: 4.03 Mil/uL (ref 3.87–5.11)
RDW: 12.6 % (ref 11.5–15.5)
WBC: 5.3 10*3/uL (ref 4.0–10.5)

## 2015-09-10 LAB — LIPID PANEL
CHOL/HDL RATIO: 3
Cholesterol: 190 mg/dL (ref 0–200)
HDL: 55.7 mg/dL (ref >39.00)
NONHDL: 134.53
TRIGLYCERIDES: 202 mg/dL — AB (ref 0.0–149.0)
VLDL: 40.4 mg/dL — ABNORMAL HIGH (ref 0.0–40.0)

## 2015-09-10 LAB — LDL CHOLESTEROL, DIRECT: LDL DIRECT: 97 mg/dL

## 2015-09-10 LAB — TSH: TSH: 2.46 u[IU]/mL (ref 0.35–4.50)

## 2015-09-10 MED ORDER — AMLODIPINE BESYLATE 2.5 MG PO TABS: 2.5000 mg | ORAL_TABLET | Freq: Every day | ORAL | Status: DC

## 2015-09-10 MED ORDER — LOSARTAN POTASSIUM 100 MG PO TABS: 100.0000 mg | ORAL_TABLET | Freq: Every day | ORAL | Status: DC

## 2015-09-10 MED ORDER — LEVOTHYROXINE SODIUM 75 MCG PO TABS: 75.0000 ug | ORAL_TABLET | Freq: Every day | ORAL | Status: DC

## 2015-09-10 MED ORDER — HYDROCHLOROTHIAZIDE 25 MG PO TABS: 25.0000 mg | ORAL_TABLET | Freq: Every day | ORAL | Status: DC

## 2015-09-10 MED ORDER — ALENDRONATE SODIUM 70 MG PO TABS: ORAL_TABLET | ORAL | Status: DC

## 2015-09-10 MED ORDER — ATORVASTATIN CALCIUM 10 MG PO TABS: 10.0000 mg | ORAL_TABLET | Freq: Every day | ORAL | Status: DC

## 2015-09-10 NOTE — Assessment & Plan Note (Signed)
Advanced directives - has at home. HCPOA is son and daughter. Asked to bring us copy. 

## 2015-09-10 NOTE — Assessment & Plan Note (Signed)
Chronic, stable. Continue current regimen. Check labs today.  

## 2015-09-10 NOTE — Progress Notes (Signed)
BP 130/82 mmHg  Pulse 84  Temp(Src) 97.6 F (36.4 C) (Oral)  Ht 5' 7.75" (1.721 m)  Wt 158 lb 12 oz (72.009 kg)  BMI 24.31 kg/m2   CC: medicare wellness visit  Subjective:    Patient ID: Judy Walker, female    DOB: [birth year], 82 y.o.   MRN: [id number]  HPI: Judy Walker is a 82 y.o. female presenting on 09/10/2015 for Annual Exam   Very excited about [employer] college basketball national championship. She was star basketball player growing up.   Spends summers at [employer]. [place].   Hearing screen failed, pt declines trouble.  Vision screen with eye doctor 09/2015.  No falls in last year. Denies depression.   Preventative: Colon cancer screening - Prior had refused colonoscopy. iFOB positive last year. No h/o hemorrhoids. We referred to Dr Carlean Purl per pt preference 2015, pt did not go. No fmhx colon cancer. Rpt iFOB returned negative (2016). Requests to stop screening.  Well woman - benign hysterectomy 2003, ovarian cysts.  Mammogram - normal 09/2014. Will rpt yearly. Sister x2 with breast cancer. Consider spacing out to Q2 yrs after this. DEXA - T score -1.9 (07/2012), has been on fosamax since ~2014 Flu shot - at [place] 2012.  Pneumovax 2006, prevnar 2016.  zostavax - done 09/2013.  Advanced directives - has at home. HCPOA is son and daughter. Asked to bring [place] copy. Sunscreen use discussed. No sunburns in last year. No suspicious moles.  Seatbelt use discussed.  Doesn't drive - never has.  Lives alone. Widow 2012. Spends 1/2 year at [employer] Daughter lives nearby.  Occupation: retired Clinical cytogeneticist  Activity: Regular exercise - walking 1-2 miles daily, stays active in yard.  Diet: daily fruits/vegetables, some water.   Relevant past medical, surgical, family and social history reviewed and updated as indicated. Interim medical history since our last visit reviewed. Allergies and medications reviewed  and updated. Current Outpatient Prescriptions on File Prior to Visit  Medication Sig  . aspirin EC 81 MG tablet Take 81 mg by mouth daily.  . calcium gluconate 500 MG tablet Take 500 mg by mouth daily.    . diphenhydrAMINE (BENADRYL) 25 mg capsule Take 12.5-25 mg by mouth at bedtime as needed for sleep.  . Flaxseed, Linseed, (FLAX SEED OIL) 1000 MG CAPS Take by mouth.    . folic acid (FOLVITE) A999333 MCG tablet Take 400 mcg by mouth daily.    . multivitamin (THERAGRAN) per tablet Take 1 tablet by mouth daily.    Marland Kitchen VITAMIN D, CHOLECALCIFEROL, PO Take by mouth.     No current facility-administered medications on file prior to visit.    Review of Systems Per HPI unless specifically indicated in ROS section     Objective:    BP 130/82 mmHg  Pulse 84  Temp(Src) 97.6 F (36.4 C) (Oral)  Ht 5' 7.75" (1.721 m)  Wt 158 lb 12 oz (72.009 kg)  BMI 24.31 kg/m2  Wt Readings from Last 3 Encounters:  09/10/15 158 lb 12 oz (72.009 kg)  09/04/14 158 lb (71.668 kg)  08/08/13 152 lb 8 oz (69.174 kg)    Physical Exam  Constitutional: She is oriented to person, place, and time. She appears well-developed and well-nourished. No distress.  HENT:  Head: Normocephalic and atraumatic.  Right Ear: Hearing, tympanic membrane, external ear and ear canal normal.  Left Ear: Hearing, tympanic membrane, external ear and ear canal  normal.  Nose: Nose normal.  Mouth/Throat: Uvula is midline, oropharynx is clear and moist and mucous membranes are normal. No oropharyngeal exudate, posterior oropharyngeal edema or posterior oropharyngeal erythema.  Eyes: Conjunctivae and EOM are normal. Pupils are equal, round, and reactive to light. No scleral icterus.  Neck: Normal range of motion. Neck supple. Carotid bruit is not present. No thyromegaly present.  Cardiovascular: Normal rate, regular rhythm, normal heart sounds and intact distal pulses.   No murmur heard. Pulses:      Radial pulses are 2+ on the right side,  and 2+ on the left side.  Pulmonary/Chest: Effort normal and breath sounds normal. No respiratory distress. She has no wheezes. She has no rales.  Abdominal: Soft. Bowel sounds are normal. She exhibits no distension and no mass. There is no tenderness. There is no rebound and no guarding.  Musculoskeletal: Normal range of motion. She exhibits no edema.  Lymphadenopathy:    She has no cervical adenopathy.  Neurological: She is alert and oriented to person, place, and time.  CN grossly intact, station and gait intact Recall 3/3 Calculation 3/5 serial 7s, 4/5 [id number]  Skin: Skin is warm and dry. No rash noted.  Psychiatric: She has a normal mood and affect. Her behavior is normal. Judgment and thought content normal.  Nursing note and vitals reviewed.  Results for orders placed or performed in visit on 09/29/14  Fecal occult blood, imunochemical  Result Value Ref Range   Fecal Occult Bld Negative Negative      Assessment & Plan:   Problem List Items Addressed This Visit    Hypothyroidism    Chronic, stable. Continue current regimen. Check labs today      Relevant Medications   levothyroxine (SYNTHROID, LEVOTHROID) 75 MCG tablet   Other Relevant Orders   TSH   HLD (hyperlipidemia)    Chronic, stable. Continue current regimen. Check labs today      Relevant Medications   amLODipine (NORVASC) 2.5 MG tablet   atorvastatin (LIPITOR) 10 MG tablet   hydrochlorothiazide (HYDRODIURIL) 25 MG tablet   losartan (COZAAR) 100 MG tablet   Other Relevant Orders   Lipid panel   Essential hypertension    Chronic, stable. Continue current regimen. Check labs today      Relevant Medications   amLODipine (NORVASC) 2.5 MG tablet   atorvastatin (LIPITOR) 10 MG tablet   hydrochlorothiazide (HYDRODIURIL) 25 MG tablet   losartan (COZAAR) 100 MG tablet   Other Relevant Orders   Basic metabolic panel   Osteopenia    Latest dexa osteopenic 2014. Discussed options. Pt desires to continue  fosamax at current dose for full 5 yrs then stop. Discussed no dental work while on bisphosphonate.      Medicare annual wellness visit, subsequent - Primary    I have personally reviewed the Medicare Annual Wellness questionnaire and have noted 1. The patient's medical and social history 2. Their use of alcohol, tobacco or illicit drugs 3. Their current medications and supplements 4. The patient's functional ability including ADL's, fall risks, home safety risks and hearing or visual impairment. Cognitive function has been assessed and addressed as indicated.  5. Diet and physical activity 6. Evidence for depression or mood disorders The patients weight, height, BMI have been recorded in the chart. I have made referrals, counseling and provided education to the patient based on review of the above and I have provided the pt with a written personalized care plan for preventive services. Provider list updated.. See scanned  questionairre as needed for further documentation. Reviewed preventative protocols and updated unless pt declined.       Advanced care planning/counseling discussion    Advanced directives - has at home. HCPOA is son and daughter. Asked to bring [place] copy.      Heme positive stool    Not interested in colonoscopy. Agrees to rpt iFOB. No fmhx colon cancer. Declines hemorrhoids or recurrent blood in stool or bowel changes or unexpected weight loss.      Relevant Orders   Fecal occult blood, imunochemical   CBC with Differential/Platelet       Follow up plan: Return in about 1 year (around 09/09/2016), or as needed, for annual exam, prior fasting for blood work.  Ria Bush, MD

## 2015-09-10 NOTE — Progress Notes (Signed)
Pre visit review using our clinic review tool, if applicable. No additional management support is needed unless otherwise documented below in the visit note. 

## 2015-09-10 NOTE — Assessment & Plan Note (Signed)
Latest dexa osteopenic 2014. Discussed options. Pt desires to continue fosamax at current dose for full 5 yrs then stop. Discussed no dental work while on bisphosphonate.

## 2015-09-10 NOTE — Assessment & Plan Note (Signed)

## 2015-09-10 NOTE — Assessment & Plan Note (Addendum)
Not interested in colonoscopy. Agrees to rpt iFOB. No fmhx colon cancer. Declines hemorrhoids or recurrent blood in stool or bowel changes or unexpected weight loss.

## 2015-09-10 NOTE — Patient Instructions (Addendum)
Continue fosamax weekly.  Call [facility] to schedule mammogram.  Pass by lab for stool kit. You are doing great today! Return as needed or in 1 year for next physical. Drop off copy of advanced directives to update your chart  Health Maintenance, Female Adopting a healthy lifestyle and getting preventive care can go a long way to promote health and wellness. Talk with your health care provider about what schedule of regular examinations is right for you. This is a good chance for you to check in with your provider about disease prevention and staying healthy. In between checkups, there are plenty of things you can do on your own. Experts have done a lot of research about which lifestyle changes and preventive measures are most likely to keep you healthy. Ask your health care provider for more information. WEIGHT AND DIET  Eat a healthy diet  Be sure to include plenty of vegetables, fruits, low-fat dairy products, and lean protein.  Do not eat a lot of foods high in solid fats, added sugars, or salt.  Get regular exercise. This is one of the most important things you can do for your health.  Most adults should exercise for at least 150 minutes each week. The exercise should increase your heart rate and make you sweat (moderate-intensity exercise).  Most adults should also do strengthening exercises at least twice a week. This is in addition to the moderate-intensity exercise.  Maintain a healthy weight  Body mass index (BMI) is a measurement that can be used to identify possible weight problems. It estimates body fat based on height and weight. Your health care provider can help determine your BMI and help you achieve or maintain a healthy weight.  For females 19 years of age and older:   A BMI below 18.5 is considered underweight.  A BMI of 18.5 to 24.9 is normal.  A BMI of 25 to 29.9 is considered overweight.  A BMI of 30 and above is considered obese.  Watch levels of cholesterol  and blood lipids  You should start having your blood tested for lipids and cholesterol at 80 years of age, then have this test every 5 years.  You may need to have your cholesterol levels checked more often if:  Your lipid or cholesterol levels are high.  You are older than 80 years of age.  You are at high risk for heart disease.  CANCER SCREENING   Lung Cancer  Lung cancer screening is recommended for adults 92-72 years old who are at high risk for lung cancer because of a history of smoking.  A yearly low-dose CT scan of the lungs is recommended for people who:  Currently smoke.  Have quit within the past 15 years.  Have at least a 30-pack-year history of smoking. A pack year is smoking an average of one pack of cigarettes a day for 1 year.  Yearly screening should continue until it has been 15 years since you quit.  Yearly screening should stop if you develop a health problem that would prevent you from having lung cancer treatment.  Breast Cancer  Practice breast self-awareness. This means understanding how your breasts normally appear and feel.  It also means doing regular breast self-exams. Let your health care provider know about any changes, no matter how small.  If you are in your 20s or 30s, you should have a clinical breast exam (CBE) by a health care provider every 1-3 years as part of a regular health exam.  If you are 40 or older, have a CBE every year. Also consider having a breast X-ray (mammogram) every year.  If you have a family history of breast cancer, talk to your health care provider about genetic screening.  If you are at high risk for breast cancer, talk to your health care provider about having an MRI and a mammogram every year.  Breast cancer gene (BRCA) assessment is recommended for women who have family members with BRCA-related cancers. BRCA-related cancers include:  Breast.  Ovarian.  Tubal.  Peritoneal cancers.  Results of the  assessment will determine the need for genetic counseling and BRCA1 and BRCA2 testing. Cervical Cancer Your health care provider may recommend that you be screened regularly for cancer of the pelvic organs (ovaries, uterus, and vagina). This screening involves a pelvic examination, including checking for microscopic changes to the surface of your cervix (Pap test). You may be encouraged to have this screening done every 3 years, beginning at age 41.  For women ages 49-65, health care providers may recommend pelvic exams and Pap testing every 3 years, or they may recommend the Pap and pelvic exam, combined with testing for human papilloma virus (HPV), every 5 years. Some types of HPV increase your risk of cervical cancer. Testing for HPV may also be done on women of any age with unclear Pap test results.  Other health care providers may not recommend any screening for nonpregnant women who are considered low risk for pelvic cancer and who do not have symptoms. Ask your health care provider if a screening pelvic exam is right for you.  If you have had past treatment for cervical cancer or a condition that could lead to cancer, you need Pap tests and screening for cancer for at least 20 years after your treatment. If Pap tests have been discontinued, your risk factors (such as having a new sexual partner) need to be reassessed to determine if screening should resume. Some women have medical problems that increase the chance of getting cervical cancer. In these cases, your health care provider may recommend more frequent screening and Pap tests. Colorectal Cancer  This type of cancer can be detected and often prevented.  Routine colorectal cancer screening usually begins at 80 years of age and continues through 80 years of age.  Your health care provider may recommend screening at an earlier age if you have risk factors for colon cancer.  Your health care provider may also recommend using home test kits  to check for hidden blood in the stool.  A small camera at the end of a tube can be used to examine your colon directly (sigmoidoscopy or colonoscopy). This is done to check for the earliest forms of colorectal cancer.  Routine screening usually begins at age 51.  Direct examination of the colon should be repeated every 5-10 years through 80 years of age. However, you may need to be screened more often if early forms of precancerous polyps or small growths are found. Skin Cancer  Check your skin from head to toe regularly.  Tell your health care provider about any new moles or changes in moles, especially if there is a change in a mole's shape or color.  Also tell your health care provider if you have a mole that is larger than the size of a pencil eraser.  Always use sunscreen. Apply sunscreen liberally and repeatedly throughout the day.  Protect yourself by wearing long sleeves, pants, a wide-brimmed hat, and sunglasses whenever you  are outside. HEART DISEASE, DIABETES, AND HIGH BLOOD PRESSURE   High blood pressure causes heart disease and increases the risk of stroke. High blood pressure is more likely to develop in:  People who have blood pressure in the high end of the normal range (130-139/85-89 mm Hg).  People who are overweight or obese.  People who are African American.  If you are 13-61 years of age, have your blood pressure checked every 3-5 years. If you are 85 years of age or older, have your blood pressure checked every year. You should have your blood pressure measured twice--once when you are at a hospital or clinic, and once when you are not at a hospital or clinic. Record the average of the two measurements. To check your blood pressure when you are not at a hospital or clinic, you can use:  An automated blood pressure machine at a pharmacy.  A home blood pressure monitor.  If you are between 46 years and 29 years old, ask your health care provider if you should  take aspirin to prevent strokes.  Have regular diabetes screenings. This involves taking a blood sample to check your fasting blood sugar level.  If you are at a normal weight and have a low risk for diabetes, have this test once every three years after 80 years of age.  If you are overweight and have a high risk for diabetes, consider being tested at a younger age or more often. PREVENTING INFECTION  Hepatitis B  If you have a higher risk for hepatitis B, you should be screened for this virus. You are considered at high risk for hepatitis B if:  You were born in a country where hepatitis B is common. Ask your health care provider which countries are considered high risk.  Your parents were born in a high-risk country, and you have not been immunized against hepatitis B (hepatitis B vaccine).  You have HIV or AIDS.  You use needles to inject street drugs.  You live with someone who has hepatitis B.  You have had sex with someone who has hepatitis B.  You get hemodialysis treatment.  You take certain medicines for conditions, including cancer, organ transplantation, and autoimmune conditions. Hepatitis C  Blood testing is recommended for:  Everyone born from [birth year] through 1963.  Anyone with known risk factors for hepatitis C. Sexually transmitted infections (STIs)  You should be screened for sexually transmitted infections (STIs) including gonorrhea and chlamydia if:  You are sexually active and are younger than 80 years of age.  You are older than 80 years of age and your health care provider tells you that you are at risk for this type of infection.  Your sexual activity has changed since you were last screened and you are at an increased risk for chlamydia or gonorrhea. Ask your health care provider if you are at risk.  If you do not have HIV, but are at risk, it may be recommended that you take a prescription medicine daily to prevent HIV infection. This is called  pre-exposure prophylaxis (PrEP). You are considered at risk if:  You are sexually active and do not regularly use condoms or know the HIV status of your partner(s).  You take drugs by injection.  You are sexually active with a partner who has HIV. Talk with your health care provider about whether you are at high risk of being infected with HIV. If you choose to begin PrEP, you should first be tested for  HIV. You should then be tested every 3 months for as long as you are taking PrEP.  PREGNANCY   If you are premenopausal and you may become pregnant, ask your health care provider about preconception counseling.  If you may become pregnant, take 400 to 800 micrograms (mcg) of folic acid every day.  If you want to prevent pregnancy, talk to your health care provider about birth control (contraception). OSTEOPOROSIS AND MENOPAUSE   Osteoporosis is a disease in which the bones lose minerals and strength with aging. This can result in serious bone fractures. Your risk for osteoporosis can be identified using a bone density scan.  If you are 65 years of age or older, or if you are at risk for osteoporosis and fractures, ask your health care provider if you should be screened.  Ask your health care provider whether you should take a calcium or vitamin D supplement to lower your risk for osteoporosis.  Menopause may have certain physical symptoms and risks.  Hormone replacement therapy may reduce some of these symptoms and risks. Talk to your health care provider about whether hormone replacement therapy is right for you.  HOME CARE INSTRUCTIONS   Schedule regular health, dental, and eye exams.  Stay current with your immunizations.   Do not use any tobacco products including cigarettes, chewing tobacco, or electronic cigarettes.  If you are pregnant, do not drink alcohol.  If you are breastfeeding, limit how much and how often you drink alcohol.  Limit alcohol intake to no more than 1  drink per day for nonpregnant women. One drink equals 12 ounces of beer, 5 ounces of wine, or 1 ounces of hard liquor.  Do not use street drugs.  Do not share needles.  Ask your health care provider for help if you need support or information about quitting drugs.  Tell your health care provider if you often feel depressed.  Tell your health care provider if you have ever been abused or do not feel safe at home.   This information is not intended to replace advice given to you by your health care provider. Make sure you discuss any questions you have with your health care provider.   Document Released: 12/06/2010 Document Revised: 06/13/2014 Document Reviewed: 04/24/2013 Elsevier Interactive Patient Education 2016 [employer]  

## 2015-09-12 ENCOUNTER — Encounter: Payer: Self-pay | Admitting: Family Medicine

## 2015-09-12 DIAGNOSIS — H35033 Hypertensive retinopathy, bilateral: Secondary | ICD-10-CM | POA: Diagnosis not present

## 2015-09-12 DIAGNOSIS — I10 Essential (primary) hypertension: Secondary | ICD-10-CM | POA: Diagnosis not present

## 2015-09-12 DIAGNOSIS — H04123 Dry eye syndrome of bilateral lacrimal glands: Secondary | ICD-10-CM | POA: Diagnosis not present

## 2015-09-14 ENCOUNTER — Encounter: Payer: Self-pay | Admitting: *Deleted

## 2015-10-02 DIAGNOSIS — Z1231 Encounter for screening mammogram for malignant neoplasm of breast: Secondary | ICD-10-CM | POA: Diagnosis not present

## 2015-10-02 DIAGNOSIS — Z803 Family history of malignant neoplasm of breast: Secondary | ICD-10-CM | POA: Diagnosis not present

## 2015-10-02 DIAGNOSIS — M8589 Other specified disorders of bone density and structure, multiple sites: Secondary | ICD-10-CM | POA: Diagnosis not present

## 2015-10-02 LAB — HM MAMMOGRAPHY

## 2015-10-05 ENCOUNTER — Encounter: Payer: Self-pay | Admitting: Family Medicine

## 2015-10-06 ENCOUNTER — Encounter: Payer: Self-pay | Admitting: *Deleted

## 2015-10-09 ENCOUNTER — Encounter: Payer: Self-pay | Admitting: Family Medicine

## 2015-10-10 ENCOUNTER — Encounter: Payer: Self-pay | Admitting: Family Medicine

## 2015-10-10 DIAGNOSIS — E559 Vitamin D deficiency, unspecified: Secondary | ICD-10-CM | POA: Insufficient documentation

## 2015-10-12 ENCOUNTER — Encounter: Payer: Self-pay | Admitting: *Deleted

## 2016-02-24 ENCOUNTER — Other Ambulatory Visit: Payer: Self-pay | Admitting: Family Medicine

## 2016-03-20 DIAGNOSIS — Z23 Encounter for immunization: Secondary | ICD-10-CM | POA: Diagnosis not present

## 2016-08-29 ENCOUNTER — Other Ambulatory Visit: Payer: Self-pay | Admitting: Family Medicine

## 2016-09-09 DIAGNOSIS — D1801 Hemangioma of skin and subcutaneous tissue: Secondary | ICD-10-CM | POA: Diagnosis not present

## 2016-09-09 DIAGNOSIS — D225 Melanocytic nevi of trunk: Secondary | ICD-10-CM | POA: Diagnosis not present

## 2016-09-09 DIAGNOSIS — L821 Other seborrheic keratosis: Secondary | ICD-10-CM | POA: Diagnosis not present

## 2016-09-09 DIAGNOSIS — L814 Other melanin hyperpigmentation: Secondary | ICD-10-CM | POA: Diagnosis not present

## 2016-09-22 ENCOUNTER — Other Ambulatory Visit: Payer: Self-pay | Admitting: Family Medicine

## 2016-09-22 ENCOUNTER — Ambulatory Visit (INDEPENDENT_AMBULATORY_CARE_PROVIDER_SITE_OTHER): Payer: Medicare Other

## 2016-09-22 VITALS — BP 118/78 | HR 83 | Temp 97.3°F | Ht 67.25 in | Wt 160.5 lb

## 2016-09-22 DIAGNOSIS — E039 Hypothyroidism, unspecified: Secondary | ICD-10-CM

## 2016-09-22 DIAGNOSIS — Z0000 Encounter for general adult medical examination without abnormal findings: Secondary | ICD-10-CM

## 2016-09-22 DIAGNOSIS — E559 Vitamin D deficiency, unspecified: Secondary | ICD-10-CM

## 2016-09-22 DIAGNOSIS — E785 Hyperlipidemia, unspecified: Secondary | ICD-10-CM

## 2016-09-22 LAB — COMPREHENSIVE METABOLIC PANEL
ALT: 15 U/L (ref 0–35)
AST: 15 U/L (ref 0–37)
Albumin: 4.3 g/dL (ref 3.5–5.2)
Alkaline Phosphatase: 54 U/L (ref 39–117)
BILIRUBIN TOTAL: 0.7 mg/dL (ref 0.2–1.2)
BUN: 22 mg/dL (ref 6–23)
CHLORIDE: 100 meq/L (ref 96–112)
CO2: 32 meq/L (ref 19–32)
CREATININE: 0.99 mg/dL (ref 0.40–1.20)
Calcium: 9.5 mg/dL (ref 8.4–10.5)
GFR: 57.1 mL/min — ABNORMAL LOW (ref >60.00)
GLUCOSE: 92 mg/dL (ref 70–99)
Potassium: 3 mEq/L — ABNORMAL LOW (ref 3.5–5.1)
Sodium: 139 mEq/L (ref 135–145)
Total Protein: 7.3 g/dL (ref 6.0–8.3)

## 2016-09-22 LAB — LIPID PANEL
CHOL/HDL RATIO: 4
Cholesterol: 188 mg/dL (ref 0–200)
HDL: 52.1 mg/dL (ref >39.00)
LDL Cholesterol: 99 mg/dL (ref 0–99)
NONHDL: 135.83
Triglycerides: 184 mg/dL — ABNORMAL HIGH (ref 0.0–149.0)
VLDL: 36.8 mg/dL (ref 0.0–40.0)

## 2016-09-22 LAB — VITAMIN D 25 HYDROXY (VIT D DEFICIENCY, FRACTURES): VITD: 25.71 ng/mL — ABNORMAL LOW (ref 30.00–100.00)

## 2016-09-22 LAB — TSH: TSH: 3.22 u[IU]/mL (ref 0.35–4.50)

## 2016-09-22 NOTE — Patient Instructions (Signed)
Judy Walker , Thank you for taking time to come for your Medicare Wellness Visit. I appreciate your ongoing commitment to your health goals. Please review the following plan we discussed and let me know if I can assist you in the future.   These are the goals we discussed: Goals    . physical          Starting 09/22/16, I will begin walking at least 2-5 miles daily.        This is a list of the screening recommended for you and due dates:  Health Maintenance  Topic Date Due  . DTaP/Tdap/Td vaccine (1 - Tdap) 07/06/2020*  . Flu Shot  01/04/2017  . Tetanus Vaccine  07/06/2020  . DEXA scan (bone density measurement)  Completed  . Pneumonia vaccines  Completed  *Topic was postponed. The date shown is not the original due date.   Preventive Care for Adults  A healthy lifestyle and preventive care can promote health and wellness. Preventive health guidelines for adults include the following key practices.  . A routine yearly physical is a good way to check with your health care provider about your health and preventive screening. It is a chance to share any concerns and updates on your health and to receive a thorough exam.  . Visit your dentist for a routine exam and preventive care every 6 months. Brush your teeth twice a day and floss once a day. Good oral hygiene prevents tooth decay and gum disease.  . The frequency of eye exams is based on your age, health, family medical history, use  of contact lenses, and other factors. Follow your health care provider's ecommendations for frequency of eye exams.  . Eat a healthy diet. Foods like vegetables, fruits, whole grains, low-fat dairy products, and lean protein foods contain the nutrients you need without too many calories. Decrease your intake of foods high in solid fats, added sugars, and salt. Eat the right amount of calories for you. Get information about a proper diet from your health care provider, if necessary.  . Regular  physical exercise is one of the most important things you can do for your health. Most adults should get at least 150 minutes of moderate-intensity exercise (any activity that increases your heart rate and causes you to sweat) each week. In addition, most adults need muscle-strengthening exercises on 2 or more days a week.  Silver Sneakers may be a benefit available to you. To determine eligibility, you may visit the website: www.silversneakers.com or contact program at (307)198-7532 Mon-Fri between 8AM-8PM.   . Maintain a healthy weight. The body mass index (BMI) is a screening tool to identify possible weight problems. It provides an estimate of body fat based on height and weight. Your health care provider can find your BMI and can help you achieve or maintain a healthy weight.   For adults 20 years and older: ? A BMI below 18.5 is considered underweight. ? A BMI of 18.5 to 24.9 is normal. ? A BMI of 25 to 29.9 is considered overweight. ? A BMI of 30 and above is considered obese.   . Maintain normal blood lipids and cholesterol levels by exercising and minimizing your intake of saturated fat. Eat a balanced diet with plenty of fruit and vegetables. Blood tests for lipids and cholesterol should begin at age 35 and be repeated every 5 years. If your lipid or cholesterol levels are high, you are over 50, or you are at high risk for  heart disease, you may need your cholesterol levels checked more frequently. Ongoing high lipid and cholesterol levels should be treated with medicines if diet and exercise are not working.  . If you smoke, find out from your health care provider how to quit. If you do not use tobacco, please do not start.  . If you choose to drink alcohol, please do not consume more than 2 drinks per day. One drink is considered to be 12 ounces (355 mL) of beer, 5 ounces (148 mL) of wine, or 1.5 ounces (44 mL) of liquor.  . If you are 45-30 years old, ask your health care provider if  you should take aspirin to prevent strokes.  . Use sunscreen. Apply sunscreen liberally and repeatedly throughout the day. You should seek shade when your shadow is shorter than you. Protect yourself by wearing long sleeves, pants, a wide-brimmed hat, and sunglasses year round, whenever you are outdoors.  . Once a month, do a whole body skin exam, using a mirror to look at the skin on your back. Tell your health care provider of new moles, moles that have irregular borders, moles that are larger than a pencil eraser, or moles that have changed in shape or color.

## 2016-09-22 NOTE — Progress Notes (Signed)
Pre visit review using our clinic review tool, if applicable. No additional management support is needed unless otherwise documented below in the visit note. 

## 2016-09-23 ENCOUNTER — Ambulatory Visit (INDEPENDENT_AMBULATORY_CARE_PROVIDER_SITE_OTHER): Payer: Medicare Other | Admitting: Family Medicine

## 2016-09-23 ENCOUNTER — Encounter: Payer: Self-pay | Admitting: Family Medicine

## 2016-09-23 VITALS — BP 128/66 | HR 107 | Temp 97.3°F | Ht 68.0 in | Wt 162.0 lb

## 2016-09-23 DIAGNOSIS — I38 Endocarditis, valve unspecified: Secondary | ICD-10-CM | POA: Insufficient documentation

## 2016-09-23 DIAGNOSIS — E039 Hypothyroidism, unspecified: Secondary | ICD-10-CM | POA: Diagnosis not present

## 2016-09-23 DIAGNOSIS — M8580 Other specified disorders of bone density and structure, unspecified site: Secondary | ICD-10-CM | POA: Diagnosis not present

## 2016-09-23 DIAGNOSIS — E785 Hyperlipidemia, unspecified: Secondary | ICD-10-CM | POA: Diagnosis not present

## 2016-09-23 DIAGNOSIS — E559 Vitamin D deficiency, unspecified: Secondary | ICD-10-CM | POA: Diagnosis not present

## 2016-09-23 DIAGNOSIS — R011 Cardiac murmur, unspecified: Secondary | ICD-10-CM | POA: Diagnosis not present

## 2016-09-23 DIAGNOSIS — I499 Cardiac arrhythmia, unspecified: Secondary | ICD-10-CM

## 2016-09-23 DIAGNOSIS — I10 Essential (primary) hypertension: Secondary | ICD-10-CM

## 2016-09-23 DIAGNOSIS — I350 Nonrheumatic aortic (valve) stenosis: Secondary | ICD-10-CM | POA: Insufficient documentation

## 2016-09-23 DIAGNOSIS — E876 Hypokalemia: Secondary | ICD-10-CM

## 2016-09-23 MED ORDER — POTASSIUM 99 MG PO TABS: 1.0000 | ORAL_TABLET | Freq: Every day | ORAL | Status: DC

## 2016-09-23 MED ORDER — VITAMIN D 50 MCG (2000 UT) PO CAPS: 1.0000 | ORAL_CAPSULE | Freq: Every day | ORAL | Status: DC

## 2016-09-23 MED ORDER — VITAMIN D3 25 MCG (1000 UT) PO CAPS: 1.0000 | ORAL_CAPSULE | Freq: Every day | ORAL | Status: DC

## 2016-09-23 MED ORDER — POTASSIUM CHLORIDE CRYS ER 10 MEQ PO TBCR: 10.0000 meq | EXTENDED_RELEASE_TABLET | Freq: Every day | ORAL | 0 refills | Status: DC

## 2016-09-23 NOTE — Assessment & Plan Note (Signed)
Chronic, stable. Continue current regimen. 

## 2016-09-23 NOTE — Assessment & Plan Note (Signed)
Anticipate HCTZ related. Treat with Kdur 75mEq daily x 5 days then start OTC potassium pill. Discussed potassium rich food.

## 2016-09-23 NOTE — Assessment & Plan Note (Signed)
DEXA mildly worse - continue calcium, vit D, fosamax, weight bearing exercises.

## 2016-09-23 NOTE — Progress Notes (Signed)
Subjective:   Judy Walker is a 83 y.o. female who presents for Medicare Annual (Subsequent) preventive examination.  Review of Systems:  N/A Cardiac Risk Factors include: advanced age (>20men, >37 women);dyslipidemia;hypertension     Objective:     Vitals: BP 118/78 (BP Location: Right Arm, Patient Position: Sitting, Cuff Size: Normal)   Pulse 83   Temp 97.3 F (36.3 C) (Oral)   Ht 5' 7.25" (1.708 m) Comment: no shoes  Wt 160 lb 8 oz (72.8 kg)   SpO2 99%   BMI 24.95 kg/m   Body mass index is 24.95 kg/m.   Tobacco History  Smoking Status  . Never Smoker  Smokeless Tobacco  . Never Used     Counseling given: No   Past Medical History:  Diagnosis Date  . Benign positional vertigo 2005  . Colonoscopy refused    " I elected not to have one" Judy Walker reviewed 07/06/10; she continues to decline colonscopy  . Heart murmur   . HLD (hyperlipidemia)    total cholesterol 340 in 2003, NHDL 61, TG 75. NMR Lipoprofile 2005: LDL 84 (933/285), LDL glad =<120 ideally <90. Framingham study LDL goal <130  . HTN (hypertension)   . Hypothyroidism   . Osteopenia 08/2012, 09/2015   DEXA -1.9 (2014) -2.3 (2017)   Past Surgical History:  Procedure Laterality Date  . ABDOMINAL HYSTERECTOMY  2003   & BSO for cystic ovaries . No PMH of abnormal PAP  . CARDIAC CATHETERIZATION     normal  . CATARACT EXTRACTION, BILATERAL  09/2011   Dr.[name]   . CYSTOSCOPY     bladder @ age 8 for painful hematuria   Family History  Problem Relation Age of Onset  . Diabetes Father   . Heart attack Father     in 72s  . Kidney cancer Mother   . Breast cancer Sister   . Cirrhosis Sister     non alcoholic  . Stroke Sister 61  . Diabetes       4 brothers and  3 sisters  . Esophageal cancer Sister     non smoker ; non drinker  . Ovarian cancer Sister   . Heart attack Brother     1 pre 55  . Alzheimer's disease Sister   . Brain cancer Brother    History  Sexual Activity  . Sexual  activity: Not on file    Outpatient Encounter Prescriptions as of 09/22/2016  Medication Sig  . alendronate (FOSAMAX) 70 MG tablet TAKE 1 TABLET BY MOUTH 30 MINUTES BEFOREBREAKFAST ONCE A WEEK WITH ATLEAST 8 OZ. OF WATER.  Marland Kitchen amLODipine (NORVASC) 2.5 MG tablet TAKE 1 TABLET BY MOUTH DAILY  . aspirin EC 81 MG tablet Take 81 mg by mouth daily.  Marland Kitchen atorvastatin (LIPITOR) 10 MG tablet TAKE 1 TABLET BY MOUTH DAILY  . calcium gluconate 500 MG tablet Take 500 mg by mouth daily.    . diphenhydrAMINE (BENADRYL) 25 mg capsule Take 12.5-25 mg by mouth at bedtime as needed for sleep.  . Flaxseed, Linseed, (FLAX SEED OIL) 1000 MG CAPS Take by mouth.    . folic acid (FOLVITE) 734 MCG tablet Take 400 mcg by mouth daily.    . hydrochlorothiazide (HYDRODIURIL) 25 MG tablet TAKE 1 TABLET BY MOUTH DAILY  . levothyroxine (SYNTHROID, LEVOTHROID) 75 MCG tablet TAKE 1 TABLET BY MOUTH DAILY  . losartan (COZAAR) 100 MG tablet TAKE 1 TABLET BY MOUTH DAILY  . multivitamin (THERAGRAN) per tablet Take 1 tablet by  mouth daily.    Marland Kitchen VITAMIN D, CHOLECALCIFEROL, PO Take by mouth.     No facility-administered encounter medications on file as of 09/22/2016.     Activities of Daily Living In your present state of health, do you have any difficulty performing the following activities: 09/22/2016  Hearing? N  Vision? N  Difficulty concentrating or making decisions? N  Walking or climbing stairs? N  Dressing or bathing? N  Doing errands, shopping? N  Preparing Food and eating ? N  Using the Toilet? N  In the past six months, have you accidently leaked urine? N  Do you have problems with loss of bowel control? N  Managing your Medications? N  Managing your Finances? N  Housekeeping or managing your Housekeeping? N  Some recent data might be hidden    Patient Care Team: Ria Bush, MD as PCP - General (Family Medicine)    Assessment:     Hearing Screening   125Hz  250Hz  500Hz  1000Hz  2000Hz  3000Hz  4000Hz  6000Hz   8000Hz   Right ear:   0 0 40  40    Left ear:   0 0 40  40    Vision Screening Comments: Last vision exam in 2017    Exercise Activities and Dietary recommendations Current Exercise Habits: Home exercise routine, Type of exercise: walking, Time (Minutes): 45, Frequency (Times/Week): 7, Weekly Exercise (Minutes/Week): 315, Intensity: Moderate, Exercise limited by: None identified  Goals    . physical          Starting 09/22/16, I will begin walking at least 2-5 miles daily.       Fall Risk Fall Risk  09/22/2016 09/10/2015 09/04/2014 08/08/2013 07/12/2012  Falls in the past year? No No No No No   Depression Screen PHQ 2/9 Scores 09/22/2016 09/10/2015 09/04/2014 08/08/2013  PHQ - 2 Score 0 0 0 0     Cognitive Function MMSE - Mini Mental State Exam 09/22/2016  Orientation to time 5  Orientation to Place 5  Registration 3  Attention/ Calculation 0  Recall 3  Language- name 2 objects 0  Language- repeat 1  Language- follow 3 step command 3  Language- read & follow direction 0  Write a sentence 0  Copy design 0  Total score 20     PLEASE NOTE: A Mini-Cog screen was completed. Maximum score is 20. A value of 0 denotes this part of Folstein MMSE was not completed or the patient failed this part of the Mini-Cog screening.   Mini-Cog Screening Orientation to Time - Max 5 pts Orientation to Place - Max 5 pts Registration - Max 3 pts Recall - Max 3 pts Language Repeat - Max 1 pts Language Follow 3 Step Command - Max 3 pts     Immunization History  Administered Date(s) Administered  . Influenza Nasal 03/07/2015  . Influenza Whole 04/06/2007, 03/04/2008, 06/25/2009  . Influenza-Unspecified 03/06/2012, 03/06/2013, 03/06/2014, 03/06/2016  . Pneumococcal Conjugate-13 08/08/2013  . Pneumococcal Polysaccharide-23 05/11/2005  . Td 10/28/1999, 07/06/2010  . Zoster 09/04/2013   Screening Tests Health Maintenance  Topic Date Due  . DTaP/Tdap/Td (1 - Tdap) 07/06/2020 (Originally 07/07/2010)  .  INFLUENZA VACCINE  01/04/2017  . TETANUS/TDAP  07/06/2020  . DEXA SCAN  Completed  . PNA vac Low Risk Adult  Completed      Plan:     I have personally reviewed and addressed the Medicare Annual Wellness questionnaire and have noted the following in the patient's chart:  A. Medical and social history B. Use  of alcohol, tobacco or illicit drugs  C. Current medications and supplements D. Functional ability and status E.  Nutritional status F.  Physical activity G. Advance directives H. List of other physicians I.  Hospitalizations, surgeries, and ER visits in previous 12 months J.  [place] to include hearing, vision, cognitive, depression L. Referrals and appointments - none  In addition, I have reviewed and discussed with patient certain preventive protocols, quality metrics, and best practice recommendations. A written personalized care plan for preventive services as well as general preventive health recommendations were provided to patient.  See attached scanned questionnaire for additional information.   Signed,   Lindell Noe, MHA, BS, LPN Health Coach

## 2016-09-23 NOTE — Progress Notes (Signed)
Pre visit review using our clinic review tool, if applicable. No additional management support is needed unless otherwise documented below in the visit note. 

## 2016-09-23 NOTE — Progress Notes (Signed)
BP 128/66   Pulse (!) 107   Temp 97.3 F (36.3 C) (Oral)   Ht 5\' 8"  (1.727 m)   Wt 162 lb (73.5 kg)   SpO2 97%   BMI 24.63 kg/m    CC: AMW f/u visit Subjective:    Patient ID: Judy Walker, female    DOB: [birth year], 83 y.o.   MRN: [id number]  HPI: Judy Walker is a 83 y.o. female presenting on 09/23/2016 for Annual Exam   Saw Katha Cabal yesterday for medicare wellness visit. Note reviewed.    Spends summer at [facility].  Mild tachycardia noted today - resolved on recheck. Pt endorses h/o murmur. Mild exertional dyspnea and dizziness - attributes to mild dehydration when working in yard.   Preventative: Colon cancer screening - Prior had refused colonoscopy. iFOB positive 2015. We referred to Dr Carlean Purl per pt preference 2015, pt did not go. No h/o hemorrhoids. No fmhx colon cancer. Rpt iFOB returned negative (2016). Requests to stop screening.  Well woman - benign hysterectomy 2003, ovarian cysts.  Mammogram - normal 09/2015. Will rpt Q2 yrs. Sister x2 with breast cancer.  DEXA - T score -1.9 (07/2012), T -2.3 femur (09/2015). Has been on fosamax since ~2014,  Flu shot yearly at [place] 2006, prevnar 2016.  Td 2012.  zostavax - done 09/2013.  Advanced directives - has at home. HCPOA is son and daughter. Asked to bring [place] copy. Seatbelt use discussed. Sunscreen use discussed. No sunburns in last year. No suspicious moles. Saw derm this year. Non smoker Alcohol - rarely  Doesn't drive - never has.  Lives alone. Widow 2012. Spends 1/2 year at [employer] Daughter lives nearby.  Occupation: retired Clinical cytogeneticist  Activity: Regular exercise - walking 1-2 miles daily, stays active in yard.  Diet: daily fruits/vegetables, some water.   Relevant past medical, surgical, family and social history reviewed and updated as indicated. Interim medical history since our last visit reviewed. Allergies and medications reviewed and updated. Outpatient Medications Prior  to Visit  Medication Sig Dispense Refill  . alendronate (FOSAMAX) 70 MG tablet TAKE 1 TABLET BY MOUTH 30 MINUTES BEFOREBREAKFAST ONCE A WEEK WITH ATLEAST 8 OZ. OF WATER. 12 tablet 0  . amLODipine (NORVASC) 2.5 MG tablet TAKE 1 TABLET BY MOUTH DAILY 90 tablet 0  . aspirin EC 81 MG tablet Take 81 mg by mouth daily.    Marland Kitchen atorvastatin (LIPITOR) 10 MG tablet TAKE 1 TABLET BY MOUTH DAILY 90 tablet 0  . calcium gluconate 500 MG tablet Take 500 mg by mouth daily.      . diphenhydrAMINE (BENADRYL) 25 mg capsule Take 12.5-25 mg by mouth at bedtime as needed for sleep.    . Flaxseed, Linseed, (FLAX SEED OIL) 1000 MG CAPS Take by mouth.      . folic acid (FOLVITE) 656 MCG tablet Take 400 mcg by mouth daily.      . hydrochlorothiazide (HYDRODIURIL) 25 MG tablet TAKE 1 TABLET BY MOUTH DAILY 90 tablet 0  . levothyroxine (SYNTHROID, LEVOTHROID) 75 MCG tablet TAKE 1 TABLET BY MOUTH DAILY 90 tablet 0  . losartan (COZAAR) 100 MG tablet TAKE 1 TABLET BY MOUTH DAILY 90 tablet 0  . multivitamin (THERAGRAN) per tablet Take 1 tablet by mouth daily.      Marland Kitchen VITAMIN D, CHOLECALCIFEROL, PO Take by mouth.       No facility-administered medications prior to visit.      Per HPI unless specifically indicated in ROS section below  Review of Systems     Objective:    BP 128/66   Pulse (!) 107   Temp 97.3 F (36.3 C) (Oral)   Ht 5\' 8"  (1.727 m)   Wt 162 lb (73.5 kg)   SpO2 97%   BMI 24.63 kg/m   Wt Readings from Last 3 Encounters:  09/23/16 162 lb (73.5 kg)  09/22/16 160 lb 8 oz (72.8 kg)  09/10/15 158 lb 12 oz (72 kg)    Physical Exam  Constitutional: She is oriented to person, place, and time. She appears well-developed and well-nourished. No distress.  HENT:  Head: Normocephalic and atraumatic.  Right Ear: Hearing, tympanic membrane, external ear and ear canal normal.  Left Ear: Hearing, tympanic membrane, external ear and ear canal normal.  Nose: Nose normal.  Mouth/Throat: Uvula is midline, oropharynx  is clear and moist and mucous membranes are normal. No oropharyngeal exudate, posterior oropharyngeal edema or posterior oropharyngeal erythema.  Eyes: Conjunctivae and EOM are normal. Pupils are equal, round, and reactive to light. No scleral icterus.  Neck: Normal range of motion. Neck supple. Carotid bruit is not present. No thyromegaly present.  Cardiovascular: Normal rate, regular rhythm and intact distal pulses.  Frequent extrasystoles are present.  Murmur (3/6 SEM best at LUSB with radiation to carotids) heard. Pulses:      Radial pulses are 2+ on the right side, and 2+ on the left side.  Pulmonary/Chest: Effort normal and breath sounds normal. No respiratory distress. She has no wheezes. She has no rales.  Abdominal: Soft. Bowel sounds are normal. She exhibits no distension and no mass. There is no tenderness. There is no rebound and no guarding.  Musculoskeletal: Normal range of motion. She exhibits no edema.  Lymphadenopathy:    She has no cervical adenopathy.  Neurological: She is alert and oriented to person, place, and time.  CN grossly intact, station and gait intact  Skin: Skin is warm and dry. No rash noted.  Psychiatric: She has a normal mood and affect. Her behavior is normal. Judgment and thought content normal.  Nursing note and vitals reviewed.  Results for orders placed or performed in visit on 09/22/16  Lipid panel  Result Value Ref Range   Cholesterol 188 0 - 200 mg/dL   Triglycerides 184.0 (H) 0.0 - 149.0 mg/dL   HDL 52.10 >39.00 mg/dL   VLDL 36.8 0.0 - 40.0 mg/dL   LDL Cholesterol 99 0 - 99 mg/dL   Total CHOL/HDL Ratio 4    NonHDL 135.83   Comprehensive metabolic panel  Result Value Ref Range   Sodium 139 135 - 145 mEq/L   Potassium 3.0 (L) 3.5 - 5.1 mEq/L   Chloride 100 96 - 112 mEq/L   CO2 32 19 - 32 mEq/L   Glucose, Bld 92 70 - 99 mg/dL   BUN 22 6 - 23 mg/dL   Creatinine, Ser 0.99 0.40 - 1.20 mg/dL   Total Bilirubin 0.7 0.2 - 1.2 mg/dL   Alkaline  Phosphatase 54 39 - 117 U/L   AST 15 0 - 37 U/L   ALT 15 0 - 35 U/L   Total Protein 7.3 6.0 - 8.3 g/dL   Albumin 4.3 3.5 - 5.2 g/dL   Calcium 9.5 8.4 - 10.5 mg/dL   GFR 57.10 (L) >60.00 mL/min  TSH  Result Value Ref Range   TSH 3.22 0.35 - 4.50 uIU/mL  VITAMIN D 25 Hydroxy (Vit-D Deficiency, Fractures)  Result Value Ref Range   VITD 25.71 (L)  30.00 - 100.00 ng/mL      Assessment & Plan:   Problem List Items Addressed This Visit    Essential hypertension - Primary    Chronic, stable. Continue current regimen.  Reviewed importance of good hydration status on current antihypertensive regimen.       HLD (hyperlipidemia)    Chronic, stable. Continue statin. Discussed healthy diet changes to help control triglyceride levels.       Hypokalemia    Anticipate HCTZ related. Treat with Kdur 27mEq daily x 5 days then start OTC potassium pill. Discussed potassium rich food.       Hypothyroidism    Chronic, stable. Continue current regimen.       Irregular heart beat    Anticipate benign PVCs, ?mild dehydration related.  Pt will monitor and return for further evaluation if persistent or worsening.      Osteopenia    DEXA mildly worse - continue calcium, vit D, fosamax, weight bearing exercises.       Systolic murmur    Anticipate mild AS. Will  continue to monitor.       Vitamin D deficiency    Still low - will increase vit D replacement to 2000 IU daily.           Follow up plan: Return in about 1 year (around 09/23/2017) for medicare wellness visit.  Ria Bush, MD  @

## 2016-09-23 NOTE — Assessment & Plan Note (Signed)
Anticipate benign PVCs, ?mild dehydration related.  Pt will monitor and return for further evaluation if persistent or worsening.

## 2016-09-23 NOTE — Progress Notes (Signed)
PCP notes:   Health maintenance:  Flu vaccine - per pt, vaccine administered in Oct 2017  Abnormal screenings:   Hearing - failed  Patient concerns:   None  Nurse concerns:  None  Next PCP appt:   09/23/16 @ 1130

## 2016-09-23 NOTE — Patient Instructions (Addendum)
Increase water to keep kidneys well hydrated. Increase vitamin D to 2000 units daily. Take Kdur 1 pill daily for 5 days then pick up over the counter potassium supplement and take daily. Increase potassium rich foods.  You are doing well today. Let us know if worsening dizziness or shortness of breath for further evaluation.  Return as needed or in 1 year for next wellness visit.   Health Maintenance, Female Adopting a healthy lifestyle and getting preventive care can go a long way to promote health and wellness. Talk with your health care provider about what schedule of regular examinations is right for you. This is a good chance for you to check in with your provider about disease prevention and staying healthy. In between checkups, there are plenty of things you can do on your own. Experts have done a lot of research about which lifestyle changes and preventive measures are most likely to keep you healthy. Ask your health care provider for more information. Weight and diet Eat a healthy diet  Be sure to include plenty of vegetables, fruits, low-fat dairy products, and lean protein.  Do not eat a lot of foods high in solid fats, added sugars, or salt.  Get regular exercise. This is one of the most important things you can do for your health.  Most adults should exercise for at least 150 minutes each week. The exercise should increase your heart rate and make you sweat (moderate-intensity exercise).  Most adults should also do strengthening exercises at least twice a week. This is in addition to the moderate-intensity exercise. Maintain a healthy weight  Body mass index (BMI) is a measurement that can be used to identify possible weight problems. It estimates body fat based on height and weight. Your health care provider can help determine your BMI and help you achieve or maintain a healthy weight.  For females 61 years of age and older:  A BMI below 18.5 is considered underweight.  A  BMI of 18.5 to 24.9 is normal.  A BMI of 25 to 29.9 is considered overweight.  A BMI of 30 and above is considered obese. Watch levels of cholesterol and blood lipids  You should start having your blood tested for lipids and cholesterol at 81 years of age, then have this test every 5 years.  You may need to have your cholesterol levels checked more often if:  Your lipid or cholesterol levels are high.  You are older than 81 years of age.  You are at high risk for heart disease. Cancer screening Lung Cancer  Lung cancer screening is recommended for adults 73-77 years old who are at high risk for lung cancer because of a history of smoking.  A yearly low-dose CT scan of the lungs is recommended for people who:  Currently smoke.  Have quit within the past 15 years.  Have at least a 30-pack-year history of smoking. A pack year is smoking an average of one pack of cigarettes a day for 1 year.  Yearly screening should continue until it has been 15 years since you quit.  Yearly screening should stop if you develop a health problem that would prevent you from having lung cancer treatment. Breast Cancer  Practice breast self-awareness. This means understanding how your breasts normally appear and feel.  It also means doing regular breast self-exams. Let your health care provider know about any changes, no matter how small.  If you are in your 20s or 30s, you should have a  clinical breast exam (CBE) by a health care provider every 1-3 years as part of a regular health exam.  If you are 19 or older, have a CBE every year. Also consider having a breast X-ray (mammogram) every year.  If you have a family history of breast cancer, talk to your health care provider about genetic screening.  If you are at high risk for breast cancer, talk to your health care provider about having an MRI and a mammogram every year.  Breast cancer gene (BRCA) assessment is recommended for women who have  family members with BRCA-related cancers. BRCA-related cancers include:  Breast.  Ovarian.  Tubal.  Peritoneal cancers.  Results of the assessment will determine the need for genetic counseling and BRCA1 and BRCA2 testing. Cervical Cancer  Your health care provider may recommend that you be screened regularly for cancer of the pelvic organs (ovaries, uterus, and vagina). This screening involves a pelvic examination, including checking for microscopic changes to the surface of your cervix (Pap test). You may be encouraged to have this screening done every 3 years, beginning at age 78.  For women ages 84-65, health care providers may recommend pelvic exams and Pap testing every 3 years, or they may recommend the Pap and pelvic exam, combined with testing for human papilloma virus (HPV), every 5 years. Some types of HPV increase your risk of cervical cancer. Testing for HPV may also be done on women of any age with unclear Pap test results.  Other health care providers may not recommend any screening for nonpregnant women who are considered low risk for pelvic cancer and who do not have symptoms. Ask your health care provider if a screening pelvic exam is right for you.  If you have had past treatment for cervical cancer or a condition that could lead to cancer, you need Pap tests and screening for cancer for at least 20 years after your treatment. If Pap tests have been discontinued, your risk factors (such as having a new sexual partner) need to be reassessed to determine if screening should resume. Some women have medical problems that increase the chance of getting cervical cancer. In these cases, your health care provider may recommend more frequent screening and Pap tests. Colorectal Cancer  This type of cancer can be detected and often prevented.  Routine colorectal cancer screening usually begins at 81 years of age and continues through 81 years of age.  Your health care provider may  recommend screening at an earlier age if you have risk factors for colon cancer.  Your health care provider may also recommend using home test kits to check for hidden blood in the stool.  A small camera at the end of a tube can be used to examine your colon directly (sigmoidoscopy or colonoscopy). This is done to check for the earliest forms of colorectal cancer.  Routine screening usually begins at age 38.  Direct examination of the colon should be repeated every 5-10 years through 81 years of age. However, you may need to be screened more often if early forms of precancerous polyps or small growths are found. Skin Cancer  Check your skin from head to toe regularly.  Tell your health care provider about any new moles or changes in moles, especially if there is a change in a mole's shape or color.  Also tell your health care provider if you have a mole that is larger than the size of a pencil eraser.  Always use sunscreen. Apply sunscreen  liberally and repeatedly throughout the day.  Protect yourself by wearing long sleeves, pants, a wide-brimmed hat, and sunglasses whenever you are outside. Heart disease, diabetes, and high blood pressure  High blood pressure causes heart disease and increases the risk of stroke. High blood pressure is more likely to develop in:  People who have blood pressure in the high end of the normal range (130-139/85-89 mm Hg).  People who are overweight or obese.  People who are African American.  If you are 72-19 years of age, have your blood pressure checked every 3-5 years. If you are 71 years of age or older, have your blood pressure checked every year. You should have your blood pressure measured twice-once when you are at a hospital or clinic, and once when you are not at a hospital or clinic. Record the average of the two measurements. To check your blood pressure when you are not at a hospital or clinic, you can use:  An automated blood pressure  machine at a pharmacy.  A home blood pressure monitor.  If you are between 13 years and 55 years old, ask your health care provider if you should take aspirin to prevent strokes.  Have regular diabetes screenings. This involves taking a blood sample to check your fasting blood sugar level.  If you are at a normal weight and have a low risk for diabetes, have this test once every three years after 81 years of age.  If you are overweight and have a high risk for diabetes, consider being tested at a younger age or more often. Preventing infection Hepatitis B  If you have a higher risk for hepatitis B, you should be screened for this virus. You are considered at high risk for hepatitis B if:  You were born in a country where hepatitis B is common. Ask your health care provider which countries are considered high risk.  Your parents were born in a high-risk country, and you have not been immunized against hepatitis B (hepatitis B vaccine).  You have HIV or AIDS.  You use needles to inject street drugs.  You live with someone who has hepatitis B.  You have had sex with someone who has hepatitis B.  You get hemodialysis treatment.  You take certain medicines for conditions, including cancer, organ transplantation, and autoimmune conditions. Hepatitis C  Blood testing is recommended for:  Everyone born from [birth year] through 1963.  Anyone with known risk factors for hepatitis C. Sexually transmitted infections (STIs)  You should be screened for sexually transmitted infections (STIs) including gonorrhea and chlamydia if:  You are sexually active and are younger than 81 years of age.  You are older than 81 years of age and your health care provider tells you that you are at risk for this type of infection.  Your sexual activity has changed since you were last screened and you are at an increased risk for chlamydia or gonorrhea. Ask your health care provider if you are at risk.  If  you do not have HIV, but are at risk, it may be recommended that you take a prescription medicine daily to prevent HIV infection. This is called pre-exposure prophylaxis (PrEP). You are considered at risk if:  You are sexually active and do not regularly use condoms or know the HIV status of your partner(s).  You take drugs by injection.  You are sexually active with a partner who has HIV. Talk with your health care provider about whether you are at  high risk of being infected with HIV. If you choose to begin PrEP, you should first be tested for HIV. You should then be tested every 3 months for as long as you are taking PrEP. Pregnancy  If you are premenopausal and you may become pregnant, ask your health care provider about preconception counseling.  If you may become pregnant, take 400 to 800 micrograms (mcg) of folic acid every day.  If you want to prevent pregnancy, talk to your health care provider about birth control (contraception). Osteoporosis and menopause  Osteoporosis is a disease in which the bones lose minerals and strength with aging. This can result in serious bone fractures. Your risk for osteoporosis can be identified using a bone density scan.  If you are 30 years of age or older, or if you are at risk for osteoporosis and fractures, ask your health care provider if you should be screened.  Ask your health care provider whether you should take a calcium or vitamin D supplement to lower your risk for osteoporosis.  Menopause may have certain physical symptoms and risks.  Hormone replacement therapy may reduce some of these symptoms and risks. Talk to your health care provider about whether hormone replacement therapy is right for you. Follow these instructions at home:  Schedule regular health, dental, and eye exams.  Stay current with your immunizations.  Do not use any tobacco products including cigarettes, chewing tobacco, or electronic cigarettes.  If you are  pregnant, do not drink alcohol.  If you are breastfeeding, limit how much and how often you drink alcohol.  Limit alcohol intake to no more than 1 drink per day for nonpregnant women. One drink equals 12 ounces of beer, 5 ounces of wine, or 1 ounces of hard liquor.  Do not use street drugs.  Do not share needles.  Ask your health care provider for help if you need support or information about quitting drugs.  Tell your health care provider if you often feel depressed.  Tell your health care provider if you have ever been abused or do not feel safe at home. This information is not intended to replace advice given to you by your health care provider. Make sure you discuss any questions you have with your health care provider. Document Released: 12/06/2010 Document Revised: 10/29/2015 Document Reviewed: 02/24/2015 Elsevier Interactive Patient Education  2017 [employer].

## 2016-09-23 NOTE — Assessment & Plan Note (Signed)
Still low - will increase vit D replacement to 2000 IU daily.

## 2016-09-23 NOTE — Assessment & Plan Note (Signed)
Anticipate mild AS. Will  continue to monitor.

## 2016-09-23 NOTE — Assessment & Plan Note (Addendum)
Chronic, stable. Continue current regimen.  Reviewed importance of good hydration status on current antihypertensive regimen.

## 2016-09-23 NOTE — Assessment & Plan Note (Signed)
Chronic, stable. Continue statin. Discussed healthy diet changes to help control triglyceride levels.

## 2016-09-25 NOTE — Progress Notes (Signed)
I reviewed health advisor's note, was available for consultation, and agree with documentation and plan.  

## 2016-11-21 ENCOUNTER — Other Ambulatory Visit: Payer: Self-pay | Admitting: Family Medicine

## 2017-03-15 DIAGNOSIS — Z23 Encounter for immunization: Secondary | ICD-10-CM | POA: Diagnosis not present

## 2017-05-18 ENCOUNTER — Other Ambulatory Visit: Payer: Self-pay | Admitting: Family Medicine

## 2017-07-04 ENCOUNTER — Telehealth: Payer: Self-pay

## 2017-07-04 NOTE — Telephone Encounter (Signed)
Pt has changed ins and wants to get her meds thru mail order. Pt does not know the name of the mail order pharmacy. Pt will find out name of mail order pharmacy and will set up acct and then will call to get rxs transferred. Pt asked if would transfer from [facility]. I advised pt [facility] closed on 06/29/17 and their available refills were sent to [facility]. Pt voiced understanding and will decide if she wants to get meds from [facility] or mail order.

## 2017-07-06 ENCOUNTER — Encounter: Payer: Medicare Other | Admitting: Family Medicine

## 2017-09-26 ENCOUNTER — Ambulatory Visit (INDEPENDENT_AMBULATORY_CARE_PROVIDER_SITE_OTHER): Payer: Medicare Other

## 2017-09-26 ENCOUNTER — Ambulatory Visit: Payer: Medicare Other

## 2017-09-26 VITALS — BP 130/90 | HR 94 | Temp 97.6°F | Ht 66.5 in | Wt 154.2 lb

## 2017-09-26 DIAGNOSIS — E7849 Other hyperlipidemia: Secondary | ICD-10-CM | POA: Diagnosis not present

## 2017-09-26 DIAGNOSIS — E559 Vitamin D deficiency, unspecified: Secondary | ICD-10-CM

## 2017-09-26 DIAGNOSIS — Z0000 Encounter for general adult medical examination without abnormal findings: Secondary | ICD-10-CM

## 2017-09-26 DIAGNOSIS — I10 Essential (primary) hypertension: Secondary | ICD-10-CM

## 2017-09-26 LAB — COMPREHENSIVE METABOLIC PANEL
ALBUMIN: 4.2 g/dL (ref 3.5–5.2)
ALK PHOS: 48 U/L (ref 39–117)
ALT: 11 U/L (ref 0–35)
AST: 15 U/L (ref 0–37)
BILIRUBIN TOTAL: 0.8 mg/dL (ref 0.2–1.2)
BUN: 26 mg/dL — ABNORMAL HIGH (ref 6–23)
CO2: 32 mEq/L (ref 19–32)
CREATININE: 0.97 mg/dL (ref 0.40–1.20)
Calcium: 9.7 mg/dL (ref 8.4–10.5)
Chloride: 103 mEq/L (ref 96–112)
GFR: 58.32 mL/min — ABNORMAL LOW (ref >60.00)
Glucose, Bld: 95 mg/dL (ref 70–99)
POTASSIUM: 4.6 meq/L (ref 3.5–5.1)
Sodium: 140 mEq/L (ref 135–145)
TOTAL PROTEIN: 7.3 g/dL (ref 6.0–8.3)

## 2017-09-26 LAB — LIPID PANEL
CHOLESTEROL: 155 mg/dL (ref 0–200)
HDL: 57 mg/dL (ref >39.00)
LDL Cholesterol: 70 mg/dL (ref 0–99)
NonHDL: 97.85
Total CHOL/HDL Ratio: 3
Triglycerides: 138 mg/dL (ref 0.0–149.0)
VLDL: 27.6 mg/dL (ref 0.0–40.0)

## 2017-09-26 LAB — VITAMIN D 25 HYDROXY (VIT D DEFICIENCY, FRACTURES): VITD: 26.4 ng/mL — ABNORMAL LOW (ref 30.00–100.00)

## 2017-09-26 LAB — TSH: TSH: 2.25 u[IU]/mL (ref 0.35–4.50)

## 2017-09-26 NOTE — Progress Notes (Signed)
I reviewed health advisor's note, was available for consultation, and agree with documentation and plan.  

## 2017-09-26 NOTE — Patient Instructions (Addendum)
Ms. Kopec , Thank you for taking time to come for your Medicare Wellness Visit. I appreciate your ongoing commitment to your health goals. Please review the following plan we discussed and let me know if I can assist you in the future.   These are the goals we discussed: Goals    . Increase physical activity     Starting 09/26/2017, I will continue to walk for 30 minutes daily.        This is a list of the screening recommended for you and due dates:  Health Maintenance  Topic Date Due  . DTaP/Tdap/Td vaccine (1 - Tdap) 07/06/2020*  . Flu Shot  01/04/2018  . Tetanus Vaccine  07/06/2020  . DEXA scan (bone density measurement)  Completed  . Pneumonia vaccines  Completed  *Topic was postponed. The date shown is not the original due date.    Preventive Care for Adults  A healthy lifestyle and preventive care can promote health and wellness. Preventive health guidelines for adults include the following key practices.  . A routine yearly physical is a good way to check with your health care provider about your health and preventive screening. It is a chance to share any concerns and updates on your health and to receive a thorough exam.  . Visit your dentist for a routine exam and preventive care every 6 months. Brush your teeth twice a day and floss once a day. Good oral hygiene prevents tooth decay and gum disease.  . The frequency of eye exams is based on your age, health, family medical history, use  of contact lenses, and other factors. Follow your health care provider's recommendations for frequency of eye exams.  . Eat a healthy diet. Foods like vegetables, fruits, whole grains, low-fat dairy products, and lean protein foods contain the nutrients you need without too many calories. Decrease your intake of foods high in solid fats, added sugars, and salt. Eat the right amount of calories for you. Get information about a proper diet from your health care provider, if  necessary.  . Regular physical exercise is one of the most important things you can do for your health. Most adults should get at least 150 minutes of moderate-intensity exercise (any activity that increases your heart rate and causes you to sweat) each week. In addition, most adults need muscle-strengthening exercises on 2 or more days a week.  Silver Sneakers may be a benefit available to you. To determine eligibility, you may visit the website: www.silversneakers.com or contact program at (276)265-8533 Mon-Fri between 8AM-8PM.   . Maintain a healthy weight. The body mass index (BMI) is a screening tool to identify possible weight problems. It provides an estimate of body fat based on height and weight. Your health care provider can find your BMI and can help you achieve or maintain a healthy weight.   For adults 20 years and older: ? A BMI below 18.5 is considered underweight. ? A BMI of 18.5 to 24.9 is normal. ? A BMI of 25 to 29.9 is considered overweight. ? A BMI of 30 and above is considered obese.   . Maintain normal blood lipids and cholesterol levels by exercising and minimizing your intake of saturated fat. Eat a balanced diet with plenty of fruit and vegetables. Blood tests for lipids and cholesterol should begin at age 63 and be repeated every 5 years. If your lipid or cholesterol levels are high, you are over 50, or you are at high risk for heart disease,  you may need your cholesterol levels checked more frequently. Ongoing high lipid and cholesterol levels should be treated with medicines if diet and exercise are not working.  . If you smoke, find out from your health care provider how to quit. If you do not use tobacco, please do not start.  . If you choose to drink alcohol, please do not consume more than 2 drinks per day. One drink is considered to be 12 ounces (355 mL) of beer, 5 ounces (148 mL) of wine, or 1.5 ounces (44 mL) of liquor.  . If you are 70-24 years old, ask your  health care provider if you should take aspirin to prevent strokes.  . Use sunscreen. Apply sunscreen liberally and repeatedly throughout the day. You should seek shade when your shadow is shorter than you. Protect yourself by wearing long sleeves, pants, a wide-brimmed hat, and sunglasses year round, whenever you are outdoors.  . Once a month, do a whole body skin exam, using a mirror to look at the skin on your back. Tell your health care provider of new moles, moles that have irregular borders, moles that are larger than a pencil eraser, or moles that have changed in shape or color.

## 2017-09-26 NOTE — Progress Notes (Signed)
Subjective:   Judy Walker is a 84 y.o. female who presents for Medicare Annual (Subsequent) preventive examination.  Review of Systems:  N/A Cardiac Risk Factors include: advanced age (>32men, >77 women);dyslipidemia;hypertension     Objective:     Vitals: BP 130/90 (BP Location: Right Arm, Patient Position: Sitting, Cuff Size: Normal)   Pulse 94   Temp 97.6 F (36.4 C) (Oral)   Ht 5' 6.5" (1.689 m) Comment: no shoes  Wt 154 lb 4 oz (70 kg)   SpO2 96%   BMI 24.52 kg/m   Body mass index is 24.52 kg/m.  Advanced Directives 09/26/2017 09/22/2016  Does Patient Have a Medical Advance Directive? Yes Yes  Type of Paramedic of [place];Living will [place];Living will  Copy of [place] in Chart? Yes No - copy requested    Tobacco Social History   Tobacco Use  Smoking Status Never Smoker  Smokeless Tobacco Never Used     Counseling given: No   Clinical Intake:  Pre-visit preparation completed: Yes  Pain : No/denies pain Pain Score: 0-No pain     Nutritional Status: BMI 25 -29 Overweight Nutritional Risks: None Diabetes: No  How often do you need to have someone help you when you read instructions, pamphlets, or other written materials from your doctor or pharmacy?: 1 - Never What is the last grade level you completed in school?: 12th grade  Interpreter Needed?: No  Comments: pt is a widow and lives alone Information entered by :: LPinson, LPN  Past Medical History:  Diagnosis Date  . Benign positional vertigo 2005  . Colonoscopy refused    " I elected not to have one" [place] reviewed 07/06/10; she continues to decline colonscopy  . Heart murmur   . HLD (hyperlipidemia)    total cholesterol 340 in 2003, NHDL 61, TG 75. NMR Lipoprofile 2005: LDL 84 (933/285), LDL glad =<120 ideally <90. Framingham study LDL goal <130  . HTN (hypertension)   . Hypothyroidism   . Osteopenia 08/2012, 09/2015   DEXA -1.9 (2014) -2.3 (2017)   Past Surgical History:  Procedure Laterality Date  . ABDOMINAL HYSTERECTOMY  2003   & BSO for cystic ovaries . No PMH of abnormal PAP  . CARDIAC CATHETERIZATION     normal  . CATARACT EXTRACTION, BILATERAL  09/2011   Dr.[name]   . CYSTOSCOPY     bladder @ age 70 for painful hematuria   Family History  Problem Relation Age of Onset  . Diabetes Father   . Heart attack Father        in 57s  . Kidney cancer Mother   . Breast cancer Sister   . Cirrhosis Sister        non alcoholic  . Stroke Sister 6  . Diabetes Unknown         4 brothers and  3 sisters  . Esophageal cancer Sister        non smoker ; non drinker  . Ovarian cancer Sister   . Heart attack Brother        1 pre 55  . Alzheimer's disease Sister   . Brain cancer Brother    Social History   Socioeconomic History  . Marital status: Married    Spouse name: Not on file  . Number of children: Not on file  . Years of education: Not on file  . Highest education level: Not on file  Occupational History  . Occupation:  retired  Scientific laboratory technician  . Financial resource strain: Not on file  . Food insecurity:    Worry: Not on file    Inability: Not on file  . Transportation needs:    Medical: Not on file    Non-medical: Not on file  Tobacco Use  . Smoking status: Never Smoker  . Smokeless tobacco: Never Used  Substance and Sexual Activity  . Alcohol use: Yes    Comment:  RARELY  . Drug use: No  . Sexual activity: Not on file  Lifestyle  . Physical activity:    Days per week: Not on file    Minutes per session: Not on file  . Stress: Not on file  Relationships  . Social connections:    Talks on phone: Not on file    Gets together: Not on file    Attends religious service: Not on file    Active member of club or organization: Not on file    Attends meetings of clubs or organizations: Not on file    Relationship status: Not on file  Other Topics Concern  . Not on file    Social History Narrative   Lives alone. Widow 2012   Daughter lives nearby.   Occupation: retired Clinical cytogeneticist   Was Programmer, multimedia growing up   Activity: Regular exercise - walking 1-2 miles daily, stays active in yard.   Diet: daily fruits/vegetables, some water    Outpatient Encounter Medications as of 09/26/2017  Medication Sig  . alendronate (FOSAMAX) 70 MG tablet TAKE 1 TABLET BY MOUTH 30 MINUTES BEFOREBREAKFAST ONCE A WEEK WITH ATLEAST AN 8 OZ CUP OF WATER.  Marland Kitchen amLODipine (NORVASC) 2.5 MG tablet TAKE 1 TABLET BY MOUTH DAILY  . aspirin EC 81 MG tablet Take 81 mg by mouth daily.  Marland Kitchen atorvastatin (LIPITOR) 10 MG tablet TAKE 1 TABLET BY MOUTH DAILY  . calcium gluconate 500 MG tablet Take 500 mg by mouth daily.    . Cholecalciferol (VITAMIN D) 2000 units CAPS Take 1 capsule (2,000 Units total) by mouth daily.  . diphenhydrAMINE (BENADRYL) 25 mg capsule Take 12.5-25 mg by mouth at bedtime as needed for sleep.  . Flaxseed, Linseed, (FLAX SEED OIL) 1000 MG CAPS Take by mouth.    . folic acid (FOLVITE) 109 MCG tablet Take 400 mcg by mouth daily.    . hydrochlorothiazide (HYDRODIURIL) 25 MG tablet TAKE 1 TABLET BY MOUTH DAILY  . levothyroxine (SYNTHROID, LEVOTHROID) 75 MCG tablet TAKE 1 TABLET BY MOUTH DAILY  . losartan (COZAAR) 100 MG tablet TAKE 1 TABLET BY MOUTH DAILY  . multivitamin (THERAGRAN) per tablet Take 1 tablet by mouth daily.    . Potassium 99 MG TABS Take 1 tablet (99 mg total) by mouth daily.  . [DISCONTINUED] potassium chloride SA (K-DUR,KLOR-CON) 10 MEQ tablet Take 1 tablet (10 mEq total) by mouth daily.   No facility-administered encounter medications on file as of 09/26/2017.     Activities of Daily Living In your present state of health, do you have any difficulty performing the following activities: 09/26/2017  Hearing? N  Vision? N  Difficulty concentrating or making decisions? N  Walking or climbing stairs? N  Dressing or bathing? N  Doing errands,  shopping? N  Preparing Food and eating ? N  Using the Toilet? N  In the past six months, have you accidently leaked urine? N  Do you have problems with loss of bowel control? N  Managing your Medications? N  Managing your  Finances? N  Housekeeping or managing your Housekeeping? N  Some recent data might be hidden    Patient Care Team: Ria Bush, MD as PCP - General (Family Medicine)    Assessment:   This is a routine wellness examination for Vaughn.   Hearing Screening   125Hz  250Hz  500Hz  1000Hz  2000Hz  3000Hz  4000Hz  6000Hz  8000Hz   Right ear:   0 0 40  40    Left ear:   0 0 40  40      Visual Acuity Screening   Right eye Left eye Both eyes  Without correction: 20/40-1 20/20-1 20/25-1  With correction:        Exercise Activities and Dietary recommendations Current Exercise Habits: Home exercise routine, Type of exercise: walking, Time (Minutes): 30, Frequency (Times/Week): 7, Weekly Exercise (Minutes/Week): 210, Intensity: Moderate, Exercise limited by: None identified  Goals    . Increase physical activity     Starting 09/26/2017, I will continue to walk for 30 minutes daily.        Fall Risk Fall Risk  09/26/2017 09/22/2016 09/10/2015 09/04/2014 08/08/2013  Falls in the past year? No No No No No   Depression Screen PHQ 2/9 Scores 09/26/2017 09/22/2016 09/10/2015 09/04/2014  PHQ - 2 Score 0 0 0 0  PHQ- 9 Score 0 - - -     Cognitive Function MMSE - Mini Mental State Exam 09/26/2017 09/22/2016  Orientation to time 5 5  Orientation to Place 5 5  Registration 3 3  Attention/ Calculation 0 0  Recall 2 3  Recall-comments unable to recall 1 of 3 words -  Language- name 2 objects 0 0  Language- repeat 1 1  Language- follow 3 step command 3 3  Language- read & follow direction 0 0  Write a sentence 0 0  Copy design 0 0  Total score 19 20     PLEASE NOTE: A Mini-Cog screen was completed. Maximum score is 20. A value of 0 denotes this part of [place] MMSE was not  completed or the patient failed this part of the Mini-Cog screening.   Mini-Cog Screening Orientation to Time - Max 5 pts Orientation to Place - Max 5 pts Registration - Max 3 pts Recall - Max 3 pts Language Repeat - Max 1 pts Language Follow 3 Step Command - Max 3 pts     Immunization History  Administered Date(s) Administered  . Influenza Nasal 03/07/2015  . Influenza Whole 04/06/2007, 03/04/2008, 06/25/2009  . Influenza-Unspecified 03/06/2012, 03/06/2013, 03/06/2014, 03/06/2016  . Pneumococcal Conjugate-13 08/08/2013  . Pneumococcal Polysaccharide-23 05/11/2005  . Td 10/28/1999, 07/06/2010  . Zoster 09/04/2013    Screening Tests Health Maintenance  Topic Date Due  . DTaP/Tdap/Td (1 - Tdap) 07/06/2020 (Originally 07/07/2010)  . INFLUENZA VACCINE  01/04/2018  . TETANUS/TDAP  07/06/2020  . DEXA SCAN  Completed  . PNA vac Low Risk Adult  Completed       Plan:     I have personally reviewed, addressed, and noted the following in the patient's chart:  A. Medical and social history B. Use of alcohol, tobacco or illicit drugs  C. Current medications and supplements D. Functional ability and status E.  Nutritional status F.  Physical activity G. Advance directives H. List of other physicians I.  Hospitalizations, surgeries, and ER visits in previous 12 months J.  [place] to include hearing, vision, cognitive, depression L. Referrals and appointments - none  In addition, I have reviewed and discussed with patient certain preventive  protocols, quality metrics, and best practice recommendations. A written personalized care plan for preventive services as well as general preventive health recommendations were provided to patient.  See attached scanned questionnaire for additional information.   Signed,   Lindell Noe, MHA, BS, LPN Health Coach

## 2017-09-26 NOTE — Progress Notes (Signed)
PCP notes:   Health maintenance:  No gaps identified.  Abnormal screenings:   Hearing - failed  Hearing Screening   125Hz  250Hz  500Hz  1000Hz  2000Hz  3000Hz  4000Hz  6000Hz  8000Hz   Right ear:   0 0 40  40    Left ear:   0 0 40  40     Mini-Cog score: 19/20 MMSE - Mini Mental State Exam 09/26/2017 09/22/2016  Orientation to time 5 5  Orientation to Place 5 5  Registration 3 3  Attention/ Calculation 0 0  Recall 2 3  Recall-comments unable to recall 1 of 3 words -  Language- name 2 objects 0 0  Language- repeat 1 1  Language- follow 3 step command 3 3  Language- read & follow direction 0 0  Write a sentence 0 0  Copy design 0 0  Total score 19 20    Patient concerns:   None  Nurse concerns:  None  Next PCP appt:   09/28/17 @ 1030

## 2017-09-28 ENCOUNTER — Ambulatory Visit (INDEPENDENT_AMBULATORY_CARE_PROVIDER_SITE_OTHER): Payer: Medicare Other | Admitting: Family Medicine

## 2017-09-28 ENCOUNTER — Encounter: Payer: Self-pay | Admitting: Family Medicine

## 2017-09-28 VITALS — BP 130/86 | HR 95 | Temp 97.8°F | Ht 68.0 in | Wt 158.0 lb

## 2017-09-28 DIAGNOSIS — I10 Essential (primary) hypertension: Secondary | ICD-10-CM | POA: Diagnosis not present

## 2017-09-28 DIAGNOSIS — Z0001 Encounter for general adult medical examination with abnormal findings: Secondary | ICD-10-CM | POA: Insufficient documentation

## 2017-09-28 DIAGNOSIS — M8580 Other specified disorders of bone density and structure, unspecified site: Secondary | ICD-10-CM | POA: Diagnosis not present

## 2017-09-28 DIAGNOSIS — E039 Hypothyroidism, unspecified: Secondary | ICD-10-CM

## 2017-09-28 DIAGNOSIS — R011 Cardiac murmur, unspecified: Secondary | ICD-10-CM

## 2017-09-28 DIAGNOSIS — E559 Vitamin D deficiency, unspecified: Secondary | ICD-10-CM | POA: Diagnosis not present

## 2017-09-28 DIAGNOSIS — Z7189 Other specified counseling: Secondary | ICD-10-CM

## 2017-09-28 DIAGNOSIS — Z0000 Encounter for general adult medical examination without abnormal findings: Secondary | ICD-10-CM | POA: Insufficient documentation

## 2017-09-28 DIAGNOSIS — Z1231 Encounter for screening mammogram for malignant neoplasm of breast: Secondary | ICD-10-CM

## 2017-09-28 DIAGNOSIS — E785 Hyperlipidemia, unspecified: Secondary | ICD-10-CM

## 2017-09-28 DIAGNOSIS — Z1239 Encounter for other screening for malignant neoplasm of breast: Secondary | ICD-10-CM

## 2017-09-28 MED ORDER — LOSARTAN POTASSIUM 100 MG PO TABS: 100.0000 mg | ORAL_TABLET | Freq: Every day | ORAL | 3 refills | Status: DC

## 2017-09-28 MED ORDER — LEVOTHYROXINE SODIUM 75 MCG PO TABS: 75.0000 ug | ORAL_TABLET | Freq: Every day | ORAL | 3 refills | Status: DC

## 2017-09-28 MED ORDER — AMLODIPINE BESYLATE 2.5 MG PO TABS: 2.5000 mg | ORAL_TABLET | Freq: Every day | ORAL | 3 refills | Status: DC

## 2017-09-28 MED ORDER — ATORVASTATIN CALCIUM 10 MG PO TABS: 10.0000 mg | ORAL_TABLET | Freq: Every day | ORAL | 1 refills | Status: DC

## 2017-09-28 MED ORDER — VITAMIN D3 1.25 MG (50000 UT) PO TABS: 1.0000 | ORAL_TABLET | ORAL | 0 refills | Status: DC

## 2017-09-28 MED ORDER — HYDROCHLOROTHIAZIDE 25 MG PO TABS: 25.0000 mg | ORAL_TABLET | Freq: Every day | ORAL | 3 refills | Status: DC

## 2017-09-28 NOTE — Assessment & Plan Note (Signed)
Chronic, stable. Continue current regimen. 

## 2017-09-28 NOTE — Patient Instructions (Addendum)
Ok to stop fosamax. We will order bone density scan and mammogram at the breast center.  If interested, check with pharmacy about new 2 shot shingles series (shingrix).  Vitamin D remains low - start 50,000 units weekly for 3 months then return to 2000 units daily.  Increase water intake.   Health Maintenance, Female Adopting a healthy lifestyle and getting preventive care can go a long way to promote health and wellness. Talk with your health care provider about what schedule of regular examinations is right for you. This is a good chance for you to check in with your provider about disease prevention and staying healthy. In between checkups, there are plenty of things you can do on your own. Experts have done a lot of research about which lifestyle changes and preventive measures are most likely to keep you healthy. Ask your health care provider for more information. Weight and diet Eat a healthy diet  Be sure to include plenty of vegetables, fruits, low-fat dairy products, and lean protein.  Do not eat a lot of foods high in solid fats, added sugars, or salt.  Get regular exercise. This is one of the most important things you can do for your health. ? Most adults should exercise for at least 150 minutes each week. The exercise should increase your heart rate and make you sweat (moderate-intensity exercise). ? Most adults should also do strengthening exercises at least twice a week. This is in addition to the moderate-intensity exercise.  Maintain a healthy weight  Body mass index (BMI) is a measurement that can be used to identify possible weight problems. It estimates body fat based on height and weight. Your health care provider can help determine your BMI and help you achieve or maintain a healthy weight.  For females 50 years of age and older: ? A BMI below 18.5 is considered underweight. ? A BMI of 18.5 to 24.9 is normal. ? A BMI of 25 to 29.9 is considered overweight. ? A BMI of  30 and above is considered obese.  Watch levels of cholesterol and blood lipids  You should start having your blood tested for lipids and cholesterol at 82 years of age, then have this test every 5 years.  You may need to have your cholesterol levels checked more often if: ? Your lipid or cholesterol levels are high. ? You are older than 82 years of age. ? You are at high risk for heart disease.  Cancer screening Lung Cancer  Lung cancer screening is recommended for adults 35-36 years old who are at high risk for lung cancer because of a history of smoking.  A yearly low-dose CT scan of the lungs is recommended for people who: ? Currently smoke. ? Have quit within the past 15 years. ? Have at least a 30-pack-year history of smoking. A pack year is smoking an average of one pack of cigarettes a day for 1 year.  Yearly screening should continue until it has been 15 years since you quit.  Yearly screening should stop if you develop a health problem that would prevent you from having lung cancer treatment.  Breast Cancer  Practice breast self-awareness. This means understanding how your breasts normally appear and feel.  It also means doing regular breast self-exams. Let your health care provider know about any changes, no matter how small.  If you are in your 20s or 30s, you should have a clinical breast exam (CBE) by a health care provider every 1-3 years  as part of a regular health exam.  If you are 54 or older, have a CBE every year. Also consider having a breast X-ray (mammogram) every year.  If you have a family history of breast cancer, talk to your health care provider about genetic screening.  If you are at high risk for breast cancer, talk to your health care provider about having an MRI and a mammogram every year.  Breast cancer gene (BRCA) assessment is recommended for women who have family members with BRCA-related cancers. BRCA-related cancers  include: ? Breast. ? Ovarian. ? Tubal. ? Peritoneal cancers.  Results of the assessment will determine the need for genetic counseling and BRCA1 and BRCA2 testing.  Cervical Cancer Your health care provider may recommend that you be screened regularly for cancer of the pelvic organs (ovaries, uterus, and vagina). This screening involves a pelvic examination, including checking for microscopic changes to the surface of your cervix (Pap test). You may be encouraged to have this screening done every 3 years, beginning at age 69.  For women ages 40-65, health care providers may recommend pelvic exams and Pap testing every 3 years, or they may recommend the Pap and pelvic exam, combined with testing for human papilloma virus (HPV), every 5 years. Some types of HPV increase your risk of cervical cancer. Testing for HPV may also be done on women of any age with unclear Pap test results.  Other health care providers may not recommend any screening for nonpregnant women who are considered low risk for pelvic cancer and who do not have symptoms. Ask your health care provider if a screening pelvic exam is right for you.  If you have had past treatment for cervical cancer or a condition that could lead to cancer, you need Pap tests and screening for cancer for at least 20 years after your treatment. If Pap tests have been discontinued, your risk factors (such as having a new sexual partner) need to be reassessed to determine if screening should resume. Some women have medical problems that increase the chance of getting cervical cancer. In these cases, your health care provider may recommend more frequent screening and Pap tests.  Colorectal Cancer  This type of cancer can be detected and often prevented.  Routine colorectal cancer screening usually begins at 82 years of age and continues through 82 years of age.  Your health care provider may recommend screening at an earlier age if you have risk factors  for colon cancer.  Your health care provider may also recommend using home test kits to check for hidden blood in the stool.  A small camera at the end of a tube can be used to examine your colon directly (sigmoidoscopy or colonoscopy). This is done to check for the earliest forms of colorectal cancer.  Routine screening usually begins at age 60.  Direct examination of the colon should be repeated every 5-10 years through 82 years of age. However, you may need to be screened more often if early forms of precancerous polyps or small growths are found.  Skin Cancer  Check your skin from head to toe regularly.  Tell your health care provider about any new moles or changes in moles, especially if there is a change in a mole's shape or color.  Also tell your health care provider if you have a mole that is larger than the size of a pencil eraser.  Always use sunscreen. Apply sunscreen liberally and repeatedly throughout the day.  Protect yourself by  wearing long sleeves, pants, a wide-brimmed hat, and sunglasses whenever you are outside.  Heart disease, diabetes, and high blood pressure  High blood pressure causes heart disease and increases the risk of stroke. High blood pressure is more likely to develop in: ? People who have blood pressure in the high end of the normal range (130-139/85-89 mm Hg). ? People who are overweight or obese. ? People who are African American.  If you are 48-51 years of age, have your blood pressure checked every 3-5 years. If you are 17 years of age or older, have your blood pressure checked every year. You should have your blood pressure measured twice-once when you are at a hospital or clinic, and once when you are not at a hospital or clinic. Record the average of the two measurements. To check your blood pressure when you are not at a hospital or clinic, you can use: ? An automated blood pressure machine at a pharmacy. ? A home blood pressure monitor.  If  you are between 67 years and 58 years old, ask your health care provider if you should take aspirin to prevent strokes.  Have regular diabetes screenings. This involves taking a blood sample to check your fasting blood sugar level. ? If you are at a normal weight and have a low risk for diabetes, have this test once every three years after 82 years of age. ? If you are overweight and have a high risk for diabetes, consider being tested at a younger age or more often. Preventing infection Hepatitis B  If you have a higher risk for hepatitis B, you should be screened for this virus. You are considered at high risk for hepatitis B if: ? You were born in a country where hepatitis B is common. Ask your health care provider which countries are considered high risk. ? Your parents were born in a high-risk country, and you have not been immunized against hepatitis B (hepatitis B vaccine). ? You have HIV or AIDS. ? You use needles to inject street drugs. ? You live with someone who has hepatitis B. ? You have had sex with someone who has hepatitis B. ? You get hemodialysis treatment. ? You take certain medicines for conditions, including cancer, organ transplantation, and autoimmune conditions.  Hepatitis C  Blood testing is recommended for: ? Everyone born from [birth year] through 1963. ? Anyone with known risk factors for hepatitis C.  Sexually transmitted infections (STIs)  You should be screened for sexually transmitted infections (STIs) including gonorrhea and chlamydia if: ? You are sexually active and are younger than 82 years of age. ? You are older than 82 years of age and your health care provider tells you that you are at risk for this type of infection. ? Your sexual activity has changed since you were last screened and you are at an increased risk for chlamydia or gonorrhea. Ask your health care provider if you are at risk.  If you do not have HIV, but are at risk, it may be recommended  that you take a prescription medicine daily to prevent HIV infection. This is called pre-exposure prophylaxis (PrEP). You are considered at risk if: ? You are sexually active and do not regularly use condoms or know the HIV status of your partner(s). ? You take drugs by injection. ? You are sexually active with a partner who has HIV.  Talk with your health care provider about whether you are at high risk of being infected with  HIV. If you choose to begin PrEP, you should first be tested for HIV. You should then be tested every 3 months for as long as you are taking PrEP. Pregnancy  If you are premenopausal and you may become pregnant, ask your health care provider about preconception counseling.  If you may become pregnant, take 400 to 800 micrograms (mcg) of folic acid every day.  If you want to prevent pregnancy, talk to your health care provider about birth control (contraception). Osteoporosis and menopause  Osteoporosis is a disease in which the bones lose minerals and strength with aging. This can result in serious bone fractures. Your risk for osteoporosis can be identified using a bone density scan.  If you are 21 years of age or older, or if you are at risk for osteoporosis and fractures, ask your health care provider if you should be screened.  Ask your health care provider whether you should take a calcium or vitamin D supplement to lower your risk for osteoporosis.  Menopause may have certain physical symptoms and risks.  Hormone replacement therapy may reduce some of these symptoms and risks. Talk to your health care provider about whether hormone replacement therapy is right for you. Follow these instructions at home:  Schedule regular health, dental, and eye exams.  Stay current with your immunizations.  Do not use any tobacco products including cigarettes, chewing tobacco, or electronic cigarettes.  If you are pregnant, do not drink alcohol.  If you are  breastfeeding, limit how much and how often you drink alcohol.  Limit alcohol intake to no more than 1 drink per day for nonpregnant women. One drink equals 12 ounces of beer, 5 ounces of wine, or 1 ounces of hard liquor.  Do not use street drugs.  Do not share needles.  Ask your health care provider for help if you need support or information about quitting drugs.  Tell your health care provider if you often feel depressed.  Tell your health care provider if you have ever been abused or do not feel safe at home. This information is not intended to replace advice given to you by your health care provider. Make sure you discuss any questions you have with your health care provider. Document Released: 12/06/2010 Document Revised: 10/29/2015 Document Reviewed: 02/24/2015 Elsevier Interactive Patient Education  [employer].

## 2017-09-28 NOTE — Assessment & Plan Note (Signed)
Chronic, stable. Update DEXA. Will stop fosamax as she has been on this for 5 yrs. If worsening DEXA, consider prolia.

## 2017-09-28 NOTE — Assessment & Plan Note (Addendum)
Chronic, stable. Continue current regimen. Consider addition of beta blocker if persistently tachycardic.

## 2017-09-28 NOTE — Assessment & Plan Note (Signed)
Chronic, stable. Continue current regimen.  The ASCVD Risk score Mikey Bussing DC Jr., et al., 2013) failed to calculate for the following reasons:   The 2013 ASCVD risk score is only valid for ages 34 to 53

## 2017-09-28 NOTE — Assessment & Plan Note (Signed)
Preventative protocols reviewed and updated unless pt declined. Discussed healthy diet and lifestyle.  

## 2017-09-28 NOTE — Progress Notes (Signed)
BP 130/86 (BP Location: Left Arm, Patient Position: Sitting, Cuff Size: Normal)   Pulse 95   Temp 97.8 F (36.6 C) (Oral)   Ht 5\' 8"  (1.727 m)   Wt 158 lb (71.7 kg)   SpO2 96%   BMI 24.02 kg/m    CC: CPE Subjective:    Patient ID: Judy Walker, female    DOB: 11-12-1932, 84 y.o.   MRN: [id number]  HPI: RENADA CRONIN is a 84 y.o. female presenting on 09/28/2017 for Annual Exam (Pt 2. Wants to discuss Fosamax. Not sure if she should continue med. )   Saw Katha Cabal last week for medicare wellness visit. Note reviewed.   Summer at [facility].  Preventative: Colon cancer screening - has declined colonoscopy. iFOB positive 2015. We referred to Dr Carlean Purl per pt preference 2015, pt did not go. No h/o hemorrhoids. No fmhx colon cancer. Rpt iFOB returned negative (2016). Requested to stop screening.  Well woman - benign hysterectomy 2003, ovarian cysts.  Mammogram - normal 09/2015. Will rpt Q2 yrs. Sister x2 with breast cancer.  DEXA - T score -1.9 (07/2012), T -2.3 femur (09/2015). Has been on fosamax since ~2014. Will stop now and check DEXA. Doesn't like dairy products Flu shot yearly at [place] 2006, prevnar 2015.  Td 2012.  zostavax - 09/2013.  shingrix - discussed.  Advanced directives - scanned and in chart 09/2017. HCPOA are son Jenny Reichmann and daughter Rise Paganini. Does not desire life prolonging measures if terminal condition Seatbelt use discussed. Sunscreen use discussed. No suspicious moles. Saw derm this year. Dentist - due Eye doctor - [facility]  Non smoker Alcohol - rarely  Doesn't drive - never has. Lives alone. Widow 2012. Spends 1/2 year at [employer] Daughter lives nearby.  Occupation: retired Radiation protection practitioner  Activity: Regular exercise - walking 1-2 miles daily, stays active in yard.  Diet: daily fruits/vegetables, some water.   Relevant past medical, surgical, family and social history reviewed and updated as indicated. Interim medical history since our last visit  reviewed. Allergies and medications reviewed and updated. Outpatient Medications Prior to Visit  Medication Sig Dispense Refill  . Ascorbic Acid (VITAMIN C PO) Take 1 tablet by mouth daily. 600 mg    . aspirin EC 81 MG tablet Take 81 mg by mouth daily.    . calcium gluconate 500 MG tablet Take 500 mg by mouth daily.      . Cholecalciferol (VITAMIN D) 2000 units CAPS Take 1 capsule (2,000 Units total) by mouth daily. 30 capsule   . diphenhydrAMINE (BENADRYL) 25 mg capsule Take 12.5-25 mg by mouth at bedtime as needed for sleep.    . Flaxseed, Linseed, (FLAX SEED OIL) 1000 MG CAPS Take by mouth.      . folic acid (FOLVITE) 229 MCG tablet Take 400 mcg by mouth daily.      . Potassium 99 MG TABS Take 1 tablet (99 mg total) by mouth daily.    Marland Kitchen alendronate (FOSAMAX) 70 MG tablet TAKE 1 TABLET BY MOUTH 30 MINUTES BEFOREBREAKFAST ONCE A WEEK WITH ATLEAST AN 8 OZ CUP OF WATER. 12 tablet 1  . amLODipine (NORVASC) 2.5 MG tablet TAKE 1 TABLET BY MOUTH DAILY 90 tablet 1  . atorvastatin (LIPITOR) 10 MG tablet TAKE 1 TABLET BY MOUTH DAILY 90 tablet 1  . hydrochlorothiazide (HYDRODIURIL) 25 MG tablet TAKE 1 TABLET BY MOUTH DAILY 90 tablet 1  . levothyroxine (SYNTHROID, LEVOTHROID) 75 MCG tablet TAKE 1 TABLET BY MOUTH DAILY 90 tablet  1  . losartan (COZAAR) 100 MG tablet TAKE 1 TABLET BY MOUTH DAILY 90 tablet 1  . multivitamin (THERAGRAN) per tablet Take 1 tablet by mouth daily.       No facility-administered medications prior to visit.      Per HPI unless specifically indicated in ROS section below Review of Systems  Constitutional: Negative for activity change, appetite change, chills, fatigue, fever and unexpected weight change.  HENT: Negative for hearing loss.        Flu bug 1 month ago  Eyes: Negative for visual disturbance.  Respiratory: Negative for cough, chest tightness, shortness of breath and wheezing.   Cardiovascular: Negative for chest pain, palpitations and leg swelling.    Gastrointestinal: Negative for abdominal distention, abdominal pain, blood in stool, constipation, diarrhea, nausea and vomiting.  Genitourinary: Negative for difficulty urinating and hematuria.  Musculoskeletal: Negative for arthralgias, myalgias and neck pain.  Skin: Negative for rash.  Neurological: Negative for dizziness, seizures, syncope and headaches.  Hematological: Negative for adenopathy. Does not bruise/bleed easily.  Psychiatric/Behavioral: Negative for dysphoric mood. The patient is not nervous/anxious.        Objective:    BP 130/86 (BP Location: Left Arm, Patient Position: Sitting, Cuff Size: Normal)   Pulse 95   Temp 97.8 F (36.6 C) (Oral)   Ht 5\' 8"  (1.727 m)   Wt 158 lb (71.7 kg)   SpO2 96%   BMI 24.02 kg/m   Wt Readings from Last 3 Encounters:  09/28/17 158 lb (71.7 kg)  09/26/17 154 lb 4 oz (70 kg)  09/23/16 162 lb (73.5 kg)    Physical Exam  Constitutional: She is oriented to person, place, and time. She appears well-developed and well-nourished. No distress.  HENT:  Head: Normocephalic and atraumatic.  Right Ear: Hearing, tympanic membrane, external ear and ear canal normal.  Left Ear: Hearing, tympanic membrane, external ear and ear canal normal.  Nose: Nose normal.  Mouth/Throat: Uvula is midline, oropharynx is clear and moist and mucous membranes are normal. No oropharyngeal exudate, posterior oropharyngeal edema or posterior oropharyngeal erythema.  Eyes: Pupils are equal, round, and reactive to light. Conjunctivae and EOM are normal. No scleral icterus.  Neck: Normal range of motion. Neck supple. Carotid bruit is not present. No thyromegaly present.  Cardiovascular: Regular rhythm and intact distal pulses. Tachycardia present.  Murmur (3/6 systolic loudest at RUSB) heard. Pulses:      Radial pulses are 2+ on the right side, and 2+ on the left side.  Pulmonary/Chest: Effort normal and breath sounds normal. No respiratory distress. She has no  wheezes. She has no rales.  Abdominal: Soft. Bowel sounds are normal. She exhibits no distension and no mass. There is no tenderness. There is no rebound and no guarding.  Musculoskeletal: Normal range of motion. She exhibits no edema.  Lymphadenopathy:    She has no cervical adenopathy.  Neurological: She is alert and oriented to person, place, and time.  CN grossly intact, station and gait intact  Skin: Skin is warm and dry. No rash noted.  Psychiatric: She has a normal mood and affect. Her behavior is normal. Judgment and thought content normal.  Nursing note and vitals reviewed.  Results for orders placed or performed in visit on 09/26/17  Vitamin D, 25-hydroxy  Result Value Ref Range   VITD 26.40 (L) 30.00 - 100.00 ng/mL  TSH  Result Value Ref Range   TSH 2.25 0.35 - 4.50 uIU/mL  Comprehensive metabolic panel  Result Value Ref  Range   Sodium 140 135 - 145 mEq/L   Potassium 4.6 3.5 - 5.1 mEq/L   Chloride 103 96 - 112 mEq/L   CO2 32 19 - 32 mEq/L   Glucose, Bld 95 70 - 99 mg/dL   BUN 26 (H) 6 - 23 mg/dL   Creatinine, Ser 0.97 0.40 - 1.20 mg/dL   Total Bilirubin 0.8 0.2 - 1.2 mg/dL   Alkaline Phosphatase 48 39 - 117 U/L   AST 15 0 - 37 U/L   ALT 11 0 - 35 U/L   Total Protein 7.3 6.0 - 8.3 g/dL   Albumin 4.2 3.5 - 5.2 g/dL   Calcium 9.7 8.4 - 10.5 mg/dL   GFR 58.32 (L) >60.00 mL/min  Lipid Panel  Result Value Ref Range   Cholesterol 155 0 - 200 mg/dL   Triglycerides 138.0 0.0 - 149.0 mg/dL   HDL 57.00 >39.00 mg/dL   VLDL 27.6 0.0 - 40.0 mg/dL   LDL Cholesterol 70 0 - 99 mg/dL   Total CHOL/HDL Ratio 3    NonHDL 97.85       Assessment & Plan:   Problem List Items Addressed This Visit    Advanced care planning/counseling discussion    Advanced directives - scanned and in chart 09/2017. HCPOA are son Jenny Reichmann and daughter Rise Paganini. Does not desire life prolonging measures if terminal condition      Essential hypertension    Chronic, stable. Continue current regimen.  Consider addition of beta blocker if persistently tachycardic.       Relevant Medications   amLODipine (NORVASC) 2.5 MG tablet   atorvastatin (LIPITOR) 10 MG tablet   hydrochlorothiazide (HYDRODIURIL) 25 MG tablet   losartan (COZAAR) 100 MG tablet   Health maintenance examination - Primary    Preventative protocols reviewed and updated unless pt declined. Discussed healthy diet and lifestyle.       HLD (hyperlipidemia)    Chronic, stable. Continue current regimen.  The ASCVD Risk score Mikey Bussing DC Jr., et al., 2013) failed to calculate for the following reasons:   The 2013 ASCVD risk score is only valid for ages 47 to 67       Relevant Medications   amLODipine (NORVASC) 2.5 MG tablet   atorvastatin (LIPITOR) 10 MG tablet   hydrochlorothiazide (HYDRODIURIL) 25 MG tablet   losartan (COZAAR) 100 MG tablet   Hypothyroidism    Chronic, stable. Continue current regimen.       Relevant Medications   levothyroxine (SYNTHROID, LEVOTHROID) 75 MCG tablet   Other Relevant Orders   DG Bone Density   Osteopenia    Chronic, stable. Update DEXA. Will stop fosamax as she has been on this for 5 yrs. If worsening DEXA, consider prolia.       Relevant Orders   DG Bone Density   Systolic murmur    Anticipate AS or sclerosis. Continue to monitor.       Vitamin D deficiency    Not controlled. Will start vit D 50,000 IU weekly x 3 months then return to 2000 IU daily.        Other Visit Diagnoses    Breast cancer screening       Relevant Orders   MM Digital Screening       Meds ordered this encounter  Medications  . amLODipine (NORVASC) 2.5 MG tablet    Sig: Take 1 tablet (2.5 mg total) by mouth daily.    Dispense:  90 tablet    Refill:  3  .  atorvastatin (LIPITOR) 10 MG tablet    Sig: Take 1 tablet (10 mg total) by mouth daily.    Dispense:  90 tablet    Refill:  1  . hydrochlorothiazide (HYDRODIURIL) 25 MG tablet    Sig: Take 1 tablet (25 mg total) by mouth daily.    Dispense:   90 tablet    Refill:  3  . levothyroxine (SYNTHROID, LEVOTHROID) 75 MCG tablet    Sig: Take 1 tablet (75 mcg total) by mouth daily.    Dispense:  90 tablet    Refill:  3  . losartan (COZAAR) 100 MG tablet    Sig: Take 1 tablet (100 mg total) by mouth daily.    Dispense:  90 tablet    Refill:  3  . Cholecalciferol (VITAMIN D3) 50000 units TABS    Sig: Take 1 tablet by mouth once a week.    Dispense:  12 tablet    Refill:  0   Orders Placed This Encounter  Procedures  . MM Digital Screening    Standing Status:   Future    Standing Expiration Date:   11/29/2018    Order Specific Question:   Reason for Exam (SYMPTOM  OR DIAGNOSIS REQUIRED)    Answer:   breast cancer screening    Order Specific Question:   Preferred imaging location?    Answer:   [facility]  . DG Bone Density    Standing Status:   Future    Standing Expiration Date:   11/29/2018    Order Specific Question:   Reason for Exam (SYMPTOM  OR DIAGNOSIS REQUIRED)    Answer:   osteopenia f/u    Order Specific Question:   Preferred imaging location?    Answer:   [facility]    Follow up plan: Return in about 1 year (around 09/29/2018) for annual exam, prior fasting for blood work, medicare wellness visit.  Ria Bush, MD

## 2017-09-28 NOTE — Assessment & Plan Note (Signed)
Not controlled. Will start vit D 50,000 IU weekly x 3 months then return to 2000 IU daily.

## 2017-09-28 NOTE — Assessment & Plan Note (Signed)
Anticipate AS or sclerosis. Continue to monitor.

## 2017-09-28 NOTE — Assessment & Plan Note (Signed)
Advanced directives - scanned and in chart 09/2017. HCPOA are son Jenny Reichmann and daughter Rise Paganini. Does not desire life prolonging measures if terminal condition

## 2017-10-03 NOTE — Progress Notes (Signed)
I reviewed health advisor's note, was available for consultation, and agree with documentation and plan.  

## 2017-12-16 ENCOUNTER — Other Ambulatory Visit: Payer: Self-pay | Admitting: Family Medicine

## 2018-05-06 ENCOUNTER — Other Ambulatory Visit: Payer: Self-pay | Admitting: Family Medicine

## 2018-07-27 ENCOUNTER — Other Ambulatory Visit: Payer: Medicare Other

## 2018-07-27 ENCOUNTER — Ambulatory Visit: Payer: Medicare Other

## 2018-08-23 ENCOUNTER — Other Ambulatory Visit: Payer: Medicare Other

## 2018-08-23 ENCOUNTER — Ambulatory Visit: Payer: Medicare Other

## 2018-09-21 ENCOUNTER — Other Ambulatory Visit: Payer: Self-pay | Admitting: Family Medicine

## 2018-09-21 DIAGNOSIS — E039 Hypothyroidism, unspecified: Secondary | ICD-10-CM

## 2018-09-21 DIAGNOSIS — M8580 Other specified disorders of bone density and structure, unspecified site: Secondary | ICD-10-CM

## 2018-09-21 DIAGNOSIS — Z1231 Encounter for screening mammogram for malignant neoplasm of breast: Secondary | ICD-10-CM

## 2018-09-28 ENCOUNTER — Ambulatory Visit: Payer: Medicare Other

## 2018-10-02 ENCOUNTER — Encounter: Payer: Medicare Other | Admitting: Family Medicine

## 2018-10-31 ENCOUNTER — Other Ambulatory Visit: Payer: Self-pay | Admitting: Family Medicine

## 2018-11-02 ENCOUNTER — Other Ambulatory Visit: Payer: Self-pay | Admitting: Family Medicine

## 2019-01-24 ENCOUNTER — Other Ambulatory Visit: Payer: Self-pay | Admitting: Family Medicine

## 2019-02-01 ENCOUNTER — Other Ambulatory Visit: Payer: Self-pay | Admitting: Family Medicine

## 2019-02-04 NOTE — Telephone Encounter (Signed)
Medications were refilled for 6 months worth in June. Keep appointment in October

## 2019-03-22 ENCOUNTER — Ambulatory Visit (INDEPENDENT_AMBULATORY_CARE_PROVIDER_SITE_OTHER): Payer: Medicare Other

## 2019-03-22 DIAGNOSIS — Z0000 Encounter for general adult medical examination without abnormal findings: Secondary | ICD-10-CM | POA: Diagnosis not present

## 2019-03-22 NOTE — Progress Notes (Signed)
Subjective:   Judy Walker is a 85 y.o. female who presents for Medicare Annual (Subsequent) preventive examination.  Review of Systems:    This visit is being conducted through telemedicine via telephone at the nurse health advisor's home address due to the COVID-19 pandemic. This patient has given me verbal consent via doximity to conduct this visit, patient states they are participating from their home address. Patient and myself are on the telephone call. There is no referral for this visit. Some vital signs may be absent or patient reported.    Patient identification: identified by name, DOB, and current address  Cardiac Risk Factors include: advanced age (>85men, >19 women);hypertension;dyslipidemia     Objective:     Vitals: There were no vitals taken for this visit.  There is no height or weight on file to calculate BMI.  Advanced Directives 03/22/2019 09/26/2017 09/22/2016  Does Patient Have a Medical Advance Directive? Yes Yes Yes  Type of Paramedic of [place];Living will [place];Living will [place];Living will  Copy of [place] in Chart? Yes - validated most recent copy scanned in chart (See row information) Yes No - copy requested    Tobacco Social History   Tobacco Use  Smoking Status Never Smoker  Smokeless Tobacco Never Used     Counseling given: Not Answered   Clinical Intake:  Pre-visit preparation completed: Yes  Pain : No/denies pain     Nutritional Risks: None Diabetes: No  How often do you need to have someone help you when you read instructions, pamphlets, or other written materials from your doctor or pharmacy?: 1 - Never What is the last grade level you completed in school?: 12th  Interpreter Needed?: No  Information entered by :: CJohnson, LPN  Past Medical History:  Diagnosis Date  . Benign positional vertigo 2005  . Colonoscopy refused    " I  elected not to have one" [place] reviewed 07/06/10; she continues to decline colonscopy  . Heart murmur   . HLD (hyperlipidemia)    total cholesterol 340 in 2003, NHDL 61, TG 75. NMR Lipoprofile 2005: LDL 84 (933/285), LDL glad =<120 ideally <90. Framingham study LDL goal <130  . HTN (hypertension)   . Hypothyroidism   . Osteopenia 08/2012, 09/2015   DEXA -1.9 (2014) -2.3 (2017)   Past Surgical History:  Procedure Laterality Date  . ABDOMINAL HYSTERECTOMY  2003   & BSO for cystic ovaries . No PMH of abnormal PAP  . CARDIAC CATHETERIZATION     normal  . CATARACT EXTRACTION, BILATERAL  09/2011   Dr.[name]   . CYSTOSCOPY     bladder @ age 89 for painful hematuria   Family History  Problem Relation Age of Onset  . Diabetes Father   . Heart attack Father        in 40s  . Kidney cancer Mother   . Breast cancer Sister   . Cirrhosis Sister        non alcoholic  . Stroke Sister 48  . Diabetes Other         4 brothers and  3 sisters  . Esophageal cancer Sister        non smoker ; non drinker  . Ovarian cancer Sister   . Heart attack Brother        1 pre 55  . Alzheimer's disease Sister   . Brain cancer Brother    Social History   Socioeconomic  History  . Marital status: Married    Spouse name: Not on file  . Number of children: Not on file  . Years of education: Not on file  . Highest education level: Not on file  Occupational History  . Occupation: retired  Scientific laboratory technician  . Financial resource strain: Not hard at all  . Food insecurity    Worry: Never true    Inability: Never true  . Transportation needs    Medical: No    Non-medical: No  Tobacco Use  . Smoking status: Never Smoker  . Smokeless tobacco: Never Used  Substance and Sexual Activity  . Alcohol use: Yes    Comment:  RARELY  . Drug use: No  . Sexual activity: Not on file  Lifestyle  . Physical activity    Days per week: 0 days    Minutes per session: 0 min  . Stress: Not at all  Relationships  .  Social Herbalist on phone: Not on file    Gets together: Not on file    Attends religious service: Not on file    Active member of club or organization: Not on file    Attends meetings of clubs or organizations: Not on file    Relationship status: Not on file  Other Topics Concern  . Not on file  Social History Narrative   Lives alone. Widow 2012   Daughter lives nearby.   Occupation: retired Clinical cytogeneticist   Was Programmer, multimedia growing up   Activity: Regular exercise - walking 1-2 miles daily, stays active in yard.   Diet: daily fruits/vegetables, some water    Outpatient Encounter Medications as of 03/22/2019  Medication Sig  . amLODipine (NORVASC) 2.5 MG tablet TAKE 1 TABLET BY MOUTH EVERY DAY  . Ascorbic Acid (VITAMIN C PO) Take 1 tablet by mouth daily. 600 mg  . aspirin EC 81 MG tablet Take 81 mg by mouth daily.  Marland Kitchen atorvastatin (LIPITOR) 10 MG tablet TAKE 1 TABLET BY MOUTH EVERY DAY  . calcium gluconate 500 MG tablet Take 500 mg by mouth daily.    . Cholecalciferol (VITAMIN D) 2000 units CAPS Take 1 capsule (2,000 Units total) by mouth daily.  . Cholecalciferol (VITAMIN D3) 50000 units CAPS TAKE 1 TABLET BY MOUTH ONE TIME PER WEEK  . diphenhydrAMINE (BENADRYL) 25 mg capsule Take 12.5-25 mg by mouth at bedtime as needed for sleep.  . Flaxseed, Linseed, (FLAX SEED OIL) 1000 MG CAPS Take by mouth.    . folic acid (FOLVITE) [id number] MCG tablet Take 400 mcg by mouth daily.    . hydrochlorothiazide (HYDRODIURIL) 25 MG tablet TAKE 1 TABLET BY MOUTH EVERY DAY  . levothyroxine (SYNTHROID) 75 MCG tablet TAKE 1 TABLET BY MOUTH EVERY DAY  . losartan (COZAAR) 100 MG tablet TAKE 1 TABLET BY MOUTH EVERY DAY  . Potassium 99 MG TABS Take 1 tablet (99 mg total) by mouth daily.   No facility-administered encounter medications on file as of 03/22/2019.     Activities of Daily Living In your present state of health, do you have any difficulty performing the following activities:  03/22/2019  Hearing? N  Vision? N  Difficulty concentrating or making decisions? N  Walking or climbing stairs? N  Dressing or bathing? N  Doing errands, shopping? N  Preparing Food and eating ? N  Using the Toilet? N  In the past six months, have you accidently leaked urine? N  Do you have problems with  loss of bowel control? N  Managing your Medications? N  Managing your Finances? N  Housekeeping or managing your Housekeeping? N  Some recent data might be hidden    Patient Care Team: Ria Bush, MD as PCP - General (Family Medicine)    Assessment:   This is a routine wellness examination for Judy Walker.  Exercise Activities and Dietary recommendations Current Exercise Habits: Home exercise routine, Type of exercise: walking, Time (Minutes): 20, Frequency (Times/Week): 7, Weekly Exercise (Minutes/Week): 140, Intensity: Mild, Exercise limited by: None identified  Goals    . Increase physical activity     Starting 09/26/2017, I will continue to walk for 30 minutes daily.     . Patient Stated     03/22/2019, I will continue to maintain and take my medications.     . physical     Starting 09/22/16, I will begin walking at least 2-5 miles daily.        Fall Risk Fall Risk  03/22/2019 09/26/2017 09/22/2016 09/10/2015 09/04/2014  Falls in the past year? 0 No No No No  Risk for fall due to : Medication side effect - - - -  Follow up Falls evaluation completed;Falls prevention discussed - - - -   Is the patient's home free of loose throw rugs in walkways, pet beds, electrical cords, etc?   yes      Grab bars in the bathroom? yes      Handrails on the stairs?   yes      Adequate lighting?   yes  Timed Get Up and Go performed: N/A  Depression Screen PHQ 2/9 Scores 03/22/2019 09/26/2017 09/22/2016 09/10/2015  PHQ - 2 Score 0 0 0 0  PHQ- 9 Score 0 0 - -     Cognitive Function MMSE - Mini Mental State Exam 03/22/2019 09/26/2017 09/22/2016  Orientation to time 5 5 5   Orientation  to Place 5 5 5   Registration 3 3 3   Attention/ Calculation 5 0 0  Recall 3 2 3   Recall-comments - unable to recall 1 of 3 words -  Language- name 2 objects - 0 0  Language- repeat 1 1 1   Language- follow 3 step command - 3 3  Language- read & follow direction - 0 0  Write a sentence - 0 0  Copy design - 0 0  Total score - 19 20  Mini Cog  Mini-Cog screen was completed. Maximum score is 22. A value of 0 denotes this part of the MMSE was not completed or the patient failed this part of the Mini-Cog screening.      Immunization History  Administered Date(s) Administered  . Influenza Nasal 03/07/2015  . Influenza Whole 04/06/2007, 03/04/2008, 06/25/2009  . Influenza, High Dose Seasonal PF 04/01/2018  . Influenza-Unspecified 03/06/2012, 03/06/2013, 03/06/2014, 03/06/2016  . Pneumococcal Conjugate-13 08/08/2013  . Pneumococcal Polysaccharide-23 05/11/2005  . Td 10/28/1999, 07/06/2010  . Zoster 09/04/2013    Qualifies for Shingles Vaccine? yes  Screening Tests Health Maintenance  Topic Date Due  . INFLUENZA VACCINE  01/05/2019  . DTaP/Tdap/Td (1 - Tdap) 07/06/2020 (Originally 12/02/1951)  . TETANUS/TDAP  07/06/2020  . DEXA SCAN  Completed  . PNA vac Low Risk Adult  Completed    Cancer Screenings: Lung: Low Dose CT Chest recommended if Age 29-80 years, 30 pack-year currently smoking OR have quit w/in 15years. Patient does not qualify. Breast:  Up to date on Mammogram?No, scheduled 03/26/2019  Up to date of Bone Density/Dexa? No, scheduled 03/26/2019 Colorectal:  no longer required  Additional Screenings:  Hepatitis C Screening: n/a     Plan:    Patient will maintain and take medications as prescribed.    I have personally reviewed and noted the following in the patient's chart:   . Medical and social history . Use of alcohol, tobacco or illicit drugs  . Current medications and supplements . Functional ability and status . Nutritional status . Physical activity .  Advanced directives . List of other physicians . Hospitalizations, surgeries, and ER visits in previous 12 months . Vitals . Screenings to include cognitive, depression, and falls . Referrals and appointments  In addition, I have reviewed and discussed with patient certain preventive protocols, quality metrics, and best practice recommendations. A written personalized care plan for preventive services as well as general preventive health recommendations were provided to patient.     Andrez Grime, LPN  [id number]

## 2019-03-22 NOTE — Progress Notes (Signed)
PCP notes:  Health Maintenance: Declined Shingrix. Mammogram and Dexa scheduled for 03/26/2019.    Abnormal Screenings: none    Patient concerns: none    Nurse concerns: none    Next PCP appt.: 03/26/2019 @ 11:30 am

## 2019-03-22 NOTE — Patient Instructions (Signed)
Ms. Supernaw , Thank you for taking time to come for your Medicare Wellness Visit. I appreciate your ongoing commitment to your health goals. Please review the following plan we discussed and let me know if I can assist you in the future.   Screening recommendations/referrals: Colonoscopy: no longer required Mammogram: no longer required Bone Density: scheduled for 03/26/2019 Recommended yearly ophthalmology/optometry visit for glaucoma screening and checkup Recommended yearly dental visit for hygiene and checkup  Vaccinations: Influenza vaccine: will get at office visit next week  Pneumococcal vaccine: Completed series Tdap vaccine: up to date, completed 07/06/2010 Shingles vaccine: declined    Advanced directives: copy in chart  Conditions/risks identified: hypertension, hyperlipidemia  Next appointment: 03/26/2019 @ 11:30 am    Preventive Care 65 Years and Older, Female Preventive care refers to lifestyle choices and visits with your health care provider that can promote health and wellness. What does preventive care include?  A yearly physical exam. This is also called an annual well check.  Dental exams once or twice a year.  Routine eye exams. Ask your health care provider how often you should have your eyes checked.  Personal lifestyle choices, including:  Daily care of your teeth and gums.  Regular physical activity.  Eating a healthy diet.  Avoiding tobacco and drug use.  Limiting alcohol use.  Practicing safe sex.  Taking low-dose aspirin every day.  Taking vitamin and mineral supplements as recommended by your health care provider. What happens during an annual well check? The services and screenings done by your health care provider during your annual well check will depend on your age, overall health, lifestyle risk factors, and family history of disease. Counseling  Your health care provider may ask you questions about your:  Alcohol use.  Tobacco  use.  Drug use.  Emotional well-being.  Home and relationship well-being.  Sexual activity.  Eating habits.  History of falls.  Memory and ability to understand (cognition).  Work and work Statistician.  Reproductive health. Screening  You may have the following tests or measurements:  Height, weight, and BMI.  Blood pressure.  Lipid and cholesterol levels. These may be checked every 5 years, or more frequently if you are over 32 years old.  Skin check.  Lung cancer screening. You may have this screening every year starting at age 58 if you have a 30-pack-year history of smoking and currently smoke or have quit within the past 15 years.  Fecal occult blood test (FOBT) of the stool. You may have this test every year starting at age 27.  Flexible sigmoidoscopy or colonoscopy. You may have a sigmoidoscopy every 5 years or a colonoscopy every 10 years starting at age 67.  Hepatitis C blood test.  Hepatitis B blood test.  Sexually transmitted disease (STD) testing.  Diabetes screening. This is done by checking your blood sugar (glucose) after you have not eaten for a while (fasting). You may have this done every 1-3 years.  Bone density scan. This is done to screen for osteoporosis. You may have this done starting at age 62.  Mammogram. This may be done every 1-2 years. Talk to your health care provider about how often you should have regular mammograms. Talk with your health care provider about your test results, treatment options, and if necessary, the need for more tests. Vaccines  Your health care provider may recommend certain vaccines, such as:  Influenza vaccine. This is recommended every year.  Tetanus, diphtheria, and acellular pertussis (Tdap, Td) vaccine. You  may need a Td booster every 10 years.  Zoster vaccine. You may need this after age 16.  Pneumococcal 13-valent conjugate (PCV13) vaccine. One dose is recommended after age 40.  Pneumococcal  polysaccharide (PPSV23) vaccine. One dose is recommended after age 32. Talk to your health care provider about which screenings and vaccines you need and how often you need them. This information is not intended to replace advice given to you by your health care provider. Make sure you discuss any questions you have with your health care provider. Document Released: 06/19/2015 Document Revised: 02/10/2016 Document Reviewed: 03/24/2015 Elsevier Interactive Patient Education  2017 [place] Prevention in the Home Falls can cause injuries. They can happen to people of all ages. There are many things you can do to make your home safe and to help prevent falls. What can I do on the outside of my home?  Regularly fix the edges of walkways and driveways and fix any cracks.  Remove anything that might make you trip as you walk through a door, such as a raised step or threshold.  Trim any bushes or trees on the path to your home.  Use bright outdoor lighting.  Clear any walking paths of anything that might make someone trip, such as rocks or tools.  Regularly check to see if handrails are loose or broken. Make sure that both sides of any steps have handrails.  Any raised decks and porches should have guardrails on the edges.  Have any leaves, snow, or ice cleared regularly.  Use sand or salt on walking paths during winter.  Clean up any spills in your garage right away. This includes oil or grease spills. What can I do in the bathroom?  Use night lights.  Install grab bars by the toilet and in the tub and shower. Do not use towel bars as grab bars.  Use non-skid mats or decals in the tub or shower.  If you need to sit down in the shower, use a plastic, non-slip stool.  Keep the floor dry. Clean up any water that spills on the floor as soon as it happens.  Remove soap buildup in the tub or shower regularly.  Attach bath mats securely with double-sided non-slip rug tape.   Do not have throw rugs and other things on the floor that can make you trip. What can I do in the bedroom?  Use night lights.  Make sure that you have a light by your bed that is easy to reach.  Do not use any sheets or blankets that are too big for your bed. They should not hang down onto the floor.  Have a firm chair that has side arms. You can use this for support while you get dressed.  Do not have throw rugs and other things on the floor that can make you trip. What can I do in the kitchen?  Clean up any spills right away.  Avoid walking on wet floors.  Keep items that you use a lot in easy-to-reach places.  If you need to reach something above you, use a strong step stool that has a grab bar.  Keep electrical cords out of the way.  Do not use floor polish or wax that makes floors slippery. If you must use wax, use non-skid floor wax.  Do not have throw rugs and other things on the floor that can make you trip. What can I do with my stairs?  Do not leave any items  on the stairs.  Make sure that there are handrails on both sides of the stairs and use them. Fix handrails that are broken or loose. Make sure that handrails are as long as the stairways.  Check any carpeting to make sure that it is firmly attached to the stairs. Fix any carpet that is loose or worn.  Avoid having throw rugs at the top or bottom of the stairs. If you do have throw rugs, attach them to the floor with carpet tape.  Make sure that you have a light switch at the top of the stairs and the bottom of the stairs. If you do not have them, ask someone to add them for you. What else can I do to help prevent falls?  Wear shoes that:  Do not have high heels.  Have rubber bottoms.  Are comfortable and fit you well.  Are closed at the toe. Do not wear sandals.  If you use a stepladder:  Make sure that it is fully opened. Do not climb a closed stepladder.  Make sure that both sides of the  stepladder are locked into place.  Ask someone to hold it for you, if possible.  Clearly mark and make sure that you can see:  Any grab bars or handrails.  First and last steps.  Where the edge of each step is.  Use tools that help you move around (mobility aids) if they are needed. These include:  Canes.  Walkers.  Scooters.  Crutches.  Turn on the lights when you go into a dark area. Replace any light bulbs as soon as they burn out.  Set up your furniture so you have a clear path. Avoid moving your furniture around.  If any of your floors are uneven, fix them.  If there are any pets around you, be aware of where they are.  Review your medicines with your doctor. Some medicines can make you feel dizzy. This can increase your chance of falling. Ask your doctor what other things that you can do to help prevent falls. This information is not intended to replace advice given to you by your health care provider. Make sure you discuss any questions you have with your health care provider. Document Released: 03/19/2009 Document Revised: 10/29/2015 Document Reviewed: 06/27/2014 Elsevier Interactive Patient Education  2017 [employer].

## 2019-03-26 ENCOUNTER — Other Ambulatory Visit: Payer: Self-pay

## 2019-03-26 ENCOUNTER — Ambulatory Visit
Admission: RE | Admit: 2019-03-26 | Discharge: 2019-03-26 | Disposition: A | Discharge status: Home / Self Care | Payer: Medicare Other | Source: Ambulatory Visit | Attending: Family Medicine | Admitting: Family Medicine

## 2019-03-26 ENCOUNTER — Ambulatory Visit: Payer: Medicare Other

## 2019-03-26 ENCOUNTER — Ambulatory Visit (INDEPENDENT_AMBULATORY_CARE_PROVIDER_SITE_OTHER): Payer: Medicare Other | Admitting: Family Medicine

## 2019-03-26 ENCOUNTER — Encounter: Payer: Self-pay | Admitting: Family Medicine

## 2019-03-26 VITALS — BP 126/80 | HR 96 | Temp 97.6°F | Ht 66.75 in | Wt 164.2 lb

## 2019-03-26 DIAGNOSIS — E785 Hyperlipidemia, unspecified: Secondary | ICD-10-CM | POA: Diagnosis not present

## 2019-03-26 DIAGNOSIS — R011 Cardiac murmur, unspecified: Secondary | ICD-10-CM | POA: Diagnosis not present

## 2019-03-26 DIAGNOSIS — E039 Hypothyroidism, unspecified: Secondary | ICD-10-CM | POA: Diagnosis not present

## 2019-03-26 DIAGNOSIS — I4891 Unspecified atrial fibrillation: Secondary | ICD-10-CM

## 2019-03-26 DIAGNOSIS — M8580 Other specified disorders of bone density and structure, unspecified site: Secondary | ICD-10-CM | POA: Diagnosis not present

## 2019-03-26 DIAGNOSIS — E559 Vitamin D deficiency, unspecified: Secondary | ICD-10-CM | POA: Diagnosis not present

## 2019-03-26 DIAGNOSIS — Z0001 Encounter for general adult medical examination with abnormal findings: Secondary | ICD-10-CM | POA: Diagnosis not present

## 2019-03-26 DIAGNOSIS — Z23 Encounter for immunization: Secondary | ICD-10-CM | POA: Diagnosis not present

## 2019-03-26 DIAGNOSIS — Z780 Asymptomatic menopausal state: Secondary | ICD-10-CM | POA: Diagnosis not present

## 2019-03-26 DIAGNOSIS — Z1231 Encounter for screening mammogram for malignant neoplasm of breast: Secondary | ICD-10-CM | POA: Diagnosis not present

## 2019-03-26 DIAGNOSIS — M85852 Other specified disorders of bone density and structure, left thigh: Secondary | ICD-10-CM | POA: Diagnosis not present

## 2019-03-26 DIAGNOSIS — I10 Essential (primary) hypertension: Secondary | ICD-10-CM | POA: Diagnosis not present

## 2019-03-26 DIAGNOSIS — I480 Paroxysmal atrial fibrillation: Secondary | ICD-10-CM | POA: Insufficient documentation

## 2019-03-26 LAB — T4, FREE: Free T4: 1.1 ng/dL (ref 0.60–1.60)

## 2019-03-26 LAB — MICROALBUMIN / CREATININE URINE RATIO
Creatinine,U: 102.5 mg/dL
Microalb Creat Ratio: 0.7 mg/g (ref 0.0–30.0)
Microalb, Ur: 0.7 mg/dL (ref 0.0–1.9)

## 2019-03-26 LAB — CBC WITH DIFFERENTIAL/PLATELET
Basophils Absolute: 0 10*3/uL (ref 0.0–0.1)
Basophils Relative: 1.1 % (ref 0.0–3.0)
Eosinophils Absolute: 0.2 10*3/uL (ref 0.0–0.7)
Eosinophils Relative: 4.3 % (ref 0.0–5.0)
HCT: 39.9 % (ref 36.0–46.0)
Hemoglobin: 13.7 g/dL (ref 12.0–15.0)
Lymphocytes Relative: 22.7 % (ref 12.0–46.0)
Lymphs Abs: 1 10*3/uL (ref 0.7–4.0)
MCHC: 34.4 g/dL (ref 30.0–36.0)
MCV: 101.4 fl — ABNORMAL HIGH (ref 78.0–100.0)
Monocytes Absolute: 0.5 10*3/uL (ref 0.1–1.0)
Monocytes Relative: 10.7 % (ref 3.0–12.0)
Neutro Abs: 2.8 10*3/uL (ref 1.4–7.7)
Neutrophils Relative %: 61.2 % (ref 43.0–77.0)
Platelets: 248 10*3/uL (ref 150.0–400.0)
RBC: 3.93 Mil/uL (ref 3.87–5.11)
RDW: 12.7 % (ref 11.5–15.5)
WBC: 4.5 10*3/uL (ref 4.0–10.5)

## 2019-03-26 LAB — LIPID PANEL
Cholesterol: 186 mg/dL (ref 0–200)
HDL: 56.7 mg/dL (ref >39.00)
LDL Cholesterol: 100 mg/dL — ABNORMAL HIGH (ref 0–99)
NonHDL: 129.37
Total CHOL/HDL Ratio: 3
Triglycerides: 146 mg/dL (ref 0.0–149.0)
VLDL: 29.2 mg/dL (ref 0.0–40.0)

## 2019-03-26 LAB — COMPREHENSIVE METABOLIC PANEL
ALT: 15 U/L (ref 0–35)
AST: 17 U/L (ref 0–37)
Albumin: 4.5 g/dL (ref 3.5–5.2)
Alkaline Phosphatase: 68 U/L (ref 39–117)
BUN: 22 mg/dL (ref 6–23)
CO2: 32 mEq/L (ref 19–32)
Calcium: 9.7 mg/dL (ref 8.4–10.5)
Chloride: 100 mEq/L (ref 96–112)
Creatinine, Ser: 1.03 mg/dL (ref 0.40–1.20)
GFR: 51.01 mL/min — ABNORMAL LOW (ref >60.00)
Glucose, Bld: 102 mg/dL — ABNORMAL HIGH (ref 70–99)
Potassium: 3.9 mEq/L (ref 3.5–5.1)
Sodium: 140 mEq/L (ref 135–145)
Total Bilirubin: 0.9 mg/dL (ref 0.2–1.2)
Total Protein: 7.3 g/dL (ref 6.0–8.3)

## 2019-03-26 LAB — TSH: TSH: 3.82 u[IU]/mL (ref 0.35–4.50)

## 2019-03-26 LAB — VITAMIN D 25 HYDROXY (VIT D DEFICIENCY, FRACTURES): VITD: 56.14 ng/mL (ref 30.00–100.00)

## 2019-03-26 MED ORDER — MULTIVITAMIN ADULT PO TABS: 1.0000 | ORAL_TABLET | Freq: Every day | ORAL | Status: AC

## 2019-03-26 MED ORDER — VITAMIN D3 25 MCG (1000 UT) PO CAPS: 1.0000 | ORAL_CAPSULE | Freq: Every day | ORAL | Status: DC

## 2019-03-26 NOTE — Assessment & Plan Note (Signed)
Preventative protocols reviewed and updated unless pt declined. Discussed healthy diet and lifestyle.  

## 2019-03-26 NOTE — Progress Notes (Signed)
This visit was conducted in person.  BP 126/80 (BP Location: Left Arm, Patient Position: Sitting, Cuff Size: Normal)   Pulse 96   Temp 97.6 F (36.4 C) (Temporal)   Ht 5' 6.75" (1.695 m)   Wt 164 lb 4 oz (74.5 kg)   SpO2 98%   BMI 25.92 kg/m    CC: CPE Subjective:    Patient ID: Judy Walker, female    DOB: [birth year], 85 y.o.   MRN: [id number]  HPI: CALIXTA Walker is a 85 y.o. female presenting on 03/26/2019 for Annual Exam (Prt 2. )   Saw health advisor last week for medicare wellness visit. Note reviewed.    No exam data present    Clinical Support from 03/22/2019 in [place] at [facility] Total Score  0      Fall Risk  03/22/2019 09/26/2017 09/22/2016 09/10/2015 09/04/2014  Falls in the past year? 0 No No No No  Risk for fall due to : Medication side effect - - - -  Follow up Falls evaluation completed;Falls prevention discussed - - - -      Preventative: Colon cancer screening - has declined colonoscopy. iFOB positive2015. We referred to Dr Carlean Purl per pt preference 2015, pt did not go. No h/o hemorrhoids. No fmhx colon cancer. Rpt iFOB returned negative (2016). Requested to stop screening.  Well woman - benign hysterectomy 2003, ovarian cysts.  Mammogram - normal 09/2015. Will rptQ2 yrs.Sister x2 with breast cancer. Rpt scheduled today.  DEXA - T score -1.9 (07/2012),T -2.3 femur(09/2015). Has been on fosamax since ~2014. Stopped 2019 and check DEXA. Doesn't like dairy products. Takes calc supplement and vit D. Rpt scheduled today Flu shot yearly Pneumovax 2006, prevnar 2015.  Td 2012.  zostavax - 09/2013.  shingrix - discussed. Declines.  Advanced directives - scanned and in chart 09/2017. HCPOA are son Jenny Reichmann and daughter Rise Paganini. Does not desire life prolonging measures if terminal condition Seatbelt use discussed. Sunscreen use discussed. No suspicious moles. Sees derm regularly.  Dentist - has not seen  Eye doctor - [place] - due Non  smoker  Alcohol - rarely  Bowel - no constipation Bladder - no incontinence  Doesn't drive - never has. Lives alone. Widow 2012. Spends 1/2 year at [employer] Daughter lives nearby. Son also helps.  Occupation: retired Radiation protection practitioner  Activity: Regular exercise - walking 1-2 miles daily, stays active in yard.  Diet: daily fruits/vegetables, some water.     Relevant past medical, surgical, family and social history reviewed and updated as indicated. Interim medical history since our last visit reviewed. Allergies and medications reviewed and updated. Outpatient Medications Prior to Visit  Medication Sig Dispense Refill  . amLODipine (NORVASC) 2.5 MG tablet TAKE 1 TABLET BY MOUTH EVERY DAY 90 tablet 1  . Ascorbic Acid (VITAMIN C PO) Take 1 tablet by mouth daily. 600 mg    . aspirin EC 81 MG tablet Take 81 mg by mouth daily.    Marland Kitchen atorvastatin (LIPITOR) 10 MG tablet TAKE 1 TABLET BY MOUTH EVERY DAY 90 tablet 0  . calcium gluconate 500 MG tablet Take 500 mg by mouth daily.      . diphenhydrAMINE (BENADRYL) 25 mg capsule Take 12.5-25 mg by mouth at bedtime as needed for sleep.    . hydrochlorothiazide (HYDRODIURIL) 25 MG tablet TAKE 1 TABLET BY MOUTH EVERY DAY 90 tablet 1  . levothyroxine (SYNTHROID) 75 MCG tablet TAKE 1 TABLET BY MOUTH EVERY DAY 90 tablet 1  .  losartan (COZAAR) 100 MG tablet TAKE 1 TABLET BY MOUTH EVERY DAY 90 tablet 1  . Potassium 99 MG TABS Take 1 tablet (99 mg total) by mouth daily.    . Cholecalciferol (VITAMIN D) 2000 units CAPS Take 1 capsule (2,000 Units total) by mouth daily. 30 capsule   . Cholecalciferol (VITAMIN D3) 50000 units CAPS TAKE 1 TABLET BY MOUTH ONE TIME PER WEEK 12 capsule 0  . Flaxseed, Linseed, (FLAX SEED OIL) 1000 MG CAPS Take by mouth.      . folic acid (FOLVITE) A999333 MCG tablet Take 400 mcg by mouth daily.       No facility-administered medications prior to visit.      Per HPI unless specifically indicated in ROS section below Review of Systems   Constitutional: Negative for activity change, appetite change, chills, fatigue, fever and unexpected weight change.  HENT: Negative for hearing loss.   Eyes: Negative for visual disturbance.  Respiratory: Negative for cough, chest tightness, shortness of breath and wheezing.   Cardiovascular: Negative for chest pain, palpitations and leg swelling.  Gastrointestinal: Negative for abdominal distention, abdominal pain, blood in stool, constipation, diarrhea, nausea and vomiting.  Genitourinary: Negative for difficulty urinating and hematuria.  Musculoskeletal: Negative for arthralgias, myalgias and neck pain.  Skin: Negative for rash.  Neurological: Negative for dizziness, seizures, syncope and headaches.  Hematological: Negative for adenopathy. Does not bruise/bleed easily.  Psychiatric/Behavioral: Negative for dysphoric mood. The patient is not nervous/anxious.    Objective:    BP 126/80 (BP Location: Left Arm, Patient Position: Sitting, Cuff Size: Normal)   Pulse 96   Temp 97.6 F (36.4 C) (Temporal)   Ht 5' 6.75" (1.695 m)   Wt 164 lb 4 oz (74.5 kg)   SpO2 98%   BMI 25.92 kg/m   Wt Readings from Last 3 Encounters:  03/26/19 164 lb 4 oz (74.5 kg)  09/28/17 158 lb (71.7 kg)  09/26/17 154 lb 4 oz (70 kg)    Physical Exam Vitals signs and nursing note reviewed.  Constitutional:      General: She is not in acute distress.    Appearance: Normal appearance. She is well-developed. She is not ill-appearing.  HENT:     Head: Normocephalic and atraumatic.     Right Ear: Hearing, tympanic membrane, ear canal and external ear normal.     Left Ear: Hearing, tympanic membrane, ear canal and external ear normal.     Nose: Nose normal.     Mouth/Throat:     Mouth: Mucous membranes are moist.     Pharynx: Oropharynx is clear. Uvula midline. No oropharyngeal exudate or posterior oropharyngeal erythema.  Eyes:     General: No scleral icterus.    Extraocular Movements: Extraocular movements  intact.     Conjunctiva/sclera: Conjunctivae normal.     Pupils: Pupils are equal, round, and reactive to light.  Neck:     Musculoskeletal: Normal range of motion and neck supple.     Vascular: No carotid bruit.  Cardiovascular:     Rate and Rhythm: Normal rate. Rhythm irregular.     Pulses: Normal pulses.          Radial pulses are 2+ on the right side and 2+ on the left side.     Heart sounds: Murmur (3/6 systolic at USB) present.     Comments: Slight irregularity noted Pulmonary:     Effort: Pulmonary effort is normal. No respiratory distress.     Breath sounds: Normal breath sounds. No  wheezing, rhonchi or rales.  Abdominal:     General: Abdomen is flat. Bowel sounds are normal. There is no distension.     Palpations: Abdomen is soft. There is no mass.     Tenderness: There is no abdominal tenderness. There is no guarding or rebound.     Hernia: No hernia is present.  Musculoskeletal: Normal range of motion.     Right lower leg: No edema.     Left lower leg: No edema.  Lymphadenopathy:     Cervical: No cervical adenopathy.  Skin:    General: Skin is warm and dry.     Findings: No rash.  Neurological:     General: No focal deficit present.     Mental Status: She is alert and oriented to person, place, and time.     Comments: CN grossly intact, station and gait intact  Psychiatric:        Mood and Affect: Mood normal.        Behavior: Behavior normal.        Thought Content: Thought content normal.        Judgment: Judgment normal.       Results for orders placed or performed in visit on 09/26/17  Vitamin D, 25-hydroxy  Result Value Ref Range   VITD 26.40 (L) 30.00 - 100.00 ng/mL  TSH  Result Value Ref Range   TSH 2.25 0.35 - 4.50 uIU/mL  Comprehensive metabolic panel  Result Value Ref Range   Sodium 140 135 - 145 mEq/L   Potassium 4.6 3.5 - 5.1 mEq/L   Chloride 103 96 - 112 mEq/L   CO2 32 19 - 32 mEq/L   Glucose, Bld 95 70 - 99 mg/dL   BUN 26 (H) 6 - 23 mg/dL    Creatinine, Ser 0.97 0.40 - 1.20 mg/dL   Total Bilirubin 0.8 0.2 - 1.2 mg/dL   Alkaline Phosphatase 48 39 - 117 U/L   AST 15 0 - 37 U/L   ALT 11 0 - 35 U/L   Total Protein 7.3 6.0 - 8.3 g/dL   Albumin 4.2 3.5 - 5.2 g/dL   Calcium 9.7 8.4 - 10.5 mg/dL   GFR 58.32 (L) >60.00 mL/min  Lipid Panel  Result Value Ref Range   Cholesterol 155 0 - 200 mg/dL   Triglycerides 138.0 0.0 - 149.0 mg/dL   HDL 57.00 >39.00 mg/dL   VLDL 27.6 0.0 - 40.0 mg/dL   LDL Cholesterol 70 0 - 99 mg/dL   Total CHOL/HDL Ratio 3    NonHDL 97.85    EKG - irregular pulse 80s, normal axis, intervals, no acute ST/T changes suspicious for atrial fibrillation Assessment & Plan:   Problem List Items Addressed This Visit    Vitamin D deficiency    Continue 1000 IU daily. Update levels.       Relevant Orders   VITAMIN D 25 Hydroxy (Vit-D Deficiency, Fractures)   Systolic murmur    Chronic, anticipate AS or sclerosis. Pt remains asxs.       Relevant Orders   CBC with Differential/Platelet   Ambulatory referral to Cardiology   Osteopenia    Upcoming DEXA this year. She has been off fosamax x 1 year (10/2017) for bisphosphonate holiday. Consider prolia pending results.       Hypothyroidism    Chronic. Update thyroid function on replacement.       Relevant Orders   TSH   T4, free   HLD (hyperlipidemia)    Chronic, stable.  Continue current regimen. Ok to trial off flax seed oil.  The ASCVD Risk score Mikey Bussing DC Jr., et al., 2013) failed to calculate for the following reasons:   The 2013 ASCVD risk score is only valid for ages 85 to 66       Relevant Orders   Lipid panel   Comprehensive metabolic panel   Essential hypertension    Chronic, stable on current regimen - continue.      Relevant Orders   EKG 12-Lead (Completed)   Microalbumin / creatinine urine ratio   Encounter for routine adult medical exam with abnormal findings - Primary    Preventative protocols reviewed and updated unless pt  declined. Discussed healthy diet and lifestyle.       Atrial fibrillation, new onset ([place])    EKG suggestive of new onset atrial fibrillation, pt asymptomatic. Reviewed with patient pathophysiology of afib as well as recommendation for blood thinner given chadsvasc2 score = 4. She is already on aspirin 81mg  daily. Will refer to cardiology for further eval, recs. Pt agrees with plan.       Relevant Orders   Ambulatory referral to Cardiology    Other Visit Diagnoses    Need for influenza vaccination       Relevant Orders   Flu Vaccine QUAD High Dose(Fluad) (Completed)       Meds ordered this encounter  Medications  . Cholecalciferol (VITAMIN D3) 25 MCG (1000 UT) CAPS    Sig: Take 1 capsule (1,000 Units total) by mouth daily.    Dispense:  30 capsule  . Multiple Vitamins-Minerals (MULTIVITAMIN ADULT) TABS    Sig: Take 1 tablet by mouth daily.   Orders Placed This Encounter  Procedures  . Flu Vaccine QUAD High Dose(Fluad)  . Lipid panel  . Comprehensive metabolic panel  . TSH  . VITAMIN D 25 Hydroxy (Vit-D Deficiency, Fractures)  . CBC with Differential/Platelet  . Microalbumin / creatinine urine ratio  . T4, free  . Ambulatory referral to Cardiology    Referral Priority:   Routine    Referral Type:   Consultation    Referral Reason:   Specialty Services Required    Requested Specialty:   Cardiology    Number of Visits Requested:   1  . EKG 12-Lead    Patient instructions: Flu shot today EKG today Labs today Consider stopping flax seed and folic acid.  You are doing well. Return as needed or in 1 year for next wellness visit/physical.  Follow up plan: Return in about 1 year (around 03/25/2020) for medicare wellness visit, annual exam, prior fasting for blood work.  Ria Bush, MD

## 2019-03-26 NOTE — Assessment & Plan Note (Signed)
Chronic. Update thyroid function on replacement.

## 2019-03-26 NOTE — Assessment & Plan Note (Signed)
EKG suggestive of new onset atrial fibrillation, pt asymptomatic. Reviewed with patient pathophysiology of afib as well as recommendation for blood thinner given chadsvasc2 score = 4. She is already on aspirin 81mg  daily. Will refer to cardiology for further eval, recs. Pt agrees with plan.

## 2019-03-26 NOTE — Assessment & Plan Note (Addendum)
Upcoming DEXA this year. She has been off fosamax x 1 year (10/2017) for bisphosphonate holiday. Consider prolia pending results.

## 2019-03-26 NOTE — Assessment & Plan Note (Signed)
Chronic, stable. Continue current regimen. Ok to trial off flax seed oil.  The ASCVD Risk score Mikey Bussing DC Jr., et al., 2013) failed to calculate for the following reasons:   The 2013 ASCVD risk score is only valid for ages 84 to 83

## 2019-03-26 NOTE — Assessment & Plan Note (Signed)
Chronic, anticipate AS or sclerosis. Pt remains asxs.

## 2019-03-26 NOTE — Assessment & Plan Note (Signed)
Continue 1000 IU daily. Update levels.  

## 2019-03-26 NOTE — Assessment & Plan Note (Signed)
Chronic, stable on current regimen - continue. 

## 2019-03-26 NOTE — Patient Instructions (Addendum)
Flu shot today EKG today Labs today Consider stopping flax seed and folic acid.  You are doing well Return as needed or in 1 year for next wellness visit/physical.  Health Maintenance After Age 83 After age 71, you are at a higher risk for certain long-term diseases and infections as well as injuries from falls. Falls are a major cause of broken bones and head injuries in people who are older than age 84. Getting regular preventive care can help to keep you healthy and well. Preventive care includes getting regular testing and making lifestyle changes as recommended by your health care provider. Talk with your health care provider about:  Which screenings and tests you should have. A screening is a test that checks for a disease when you have no symptoms.  A diet and exercise plan that is right for you. What should I know about screenings and tests to prevent falls? Screening and testing are the best ways to find a health problem early. Early diagnosis and treatment give you the best chance of managing medical conditions that are common after age 36. Certain conditions and lifestyle choices may make you more likely to have a fall. Your health care provider may recommend:  Regular vision checks. Poor vision and conditions such as cataracts can make you more likely to have a fall. If you wear glasses, make sure to get your prescription updated if your vision changes.  Medicine review. Work with your health care provider to regularly review all of the medicines you are taking, including over-the-counter medicines. Ask your health care provider about any side effects that may make you more likely to have a fall. Tell your health care provider if any medicines that you take make you feel dizzy or sleepy.  Osteoporosis screening. Osteoporosis is a condition that causes the bones to get weaker. This can make the bones weak and cause them to break more easily.  Blood pressure screening. Blood pressure  changes and medicines to control blood pressure can make you feel dizzy.  Strength and balance checks. Your health care provider may recommend certain tests to check your strength and balance while standing, walking, or changing positions.  Foot health exam. Foot pain and numbness, as well as not wearing proper footwear, can make you more likely to have a fall.  Depression screening. You may be more likely to have a fall if you have a fear of falling, feel emotionally low, or feel unable to do activities that you used to do.  Alcohol use screening. Using too much alcohol can affect your balance and may make you more likely to have a fall. What actions can I take to lower my risk of falls? General instructions  Talk with your health care provider about your risks for falling. Tell your health care provider if: ? You fall. Be sure to tell your health care provider about all falls, even ones that seem minor. ? You feel dizzy, sleepy, or off-balance.  Take over-the-counter and prescription medicines only as told by your health care provider. These include any supplements.  Eat a healthy diet and maintain a healthy weight. A healthy diet includes low-fat dairy products, low-fat (lean) meats, and fiber from whole grains, beans, and lots of fruits and vegetables. Home safety  Remove any tripping hazards, such as rugs, cords, and clutter.  Install safety equipment such as grab bars in bathrooms and safety rails on stairs.  Keep rooms and walkways well-lit. Activity   Follow a regular exercise  program to stay fit. This will help you maintain your balance. Ask your health care provider what types of exercise are appropriate for you.  If you need a cane or walker, use it as recommended by your health care provider.  Wear supportive shoes that have nonskid soles. Lifestyle  Do not drink alcohol if your health care provider tells you not to drink.  If you drink alcohol, limit how much you  have: ? 0-1 drink a day for women. ? 0-2 drinks a day for men.  Be aware of how much alcohol is in your drink. In the [place] one drink equals one typical bottle of beer (12 oz), one-half glass of wine (5 oz), or one shot of hard liquor (1 oz).  Do not use any products that contain nicotine or tobacco, such as cigarettes and e-cigarettes. If you need help quitting, ask your health care provider. Summary  Having a healthy lifestyle and getting preventive care can help to protect your health and wellness after age 26.  Screening and testing are the best way to find a health problem early and help you avoid having a fall. Early diagnosis and treatment give you the best chance for managing medical conditions that are more common for people who are older than age 68.  Falls are a major cause of broken bones and head injuries in people who are older than age 71. Take precautions to prevent a fall at home.  Work with your health care provider to learn what changes you can make to improve your health and wellness and to prevent falls. This information is not intended to replace advice given to you by your health care provider. Make sure you discuss any questions you have with your health care provider. Document Released: 04/05/2017 Document Revised: 09/13/2018 Document Reviewed: 04/05/2017 Elsevier Patient Education  2020 [employer].

## 2019-03-28 ENCOUNTER — Encounter: Payer: Self-pay | Admitting: Family Medicine

## 2019-03-28 LAB — HM MAMMOGRAPHY

## 2019-03-29 ENCOUNTER — Telehealth: Payer: Self-pay | Admitting: Family Medicine

## 2019-03-29 NOTE — Telephone Encounter (Signed)
Attempted to return pt's call.  See Labs, 03/26/19.

## 2019-03-29 NOTE — Telephone Encounter (Signed)
Pt returned your call.  

## 2019-04-29 ENCOUNTER — Encounter: Payer: Self-pay | Admitting: Cardiology

## 2019-04-29 ENCOUNTER — Other Ambulatory Visit: Payer: Self-pay

## 2019-04-29 ENCOUNTER — Encounter (INDEPENDENT_AMBULATORY_CARE_PROVIDER_SITE_OTHER): Payer: Self-pay

## 2019-04-29 ENCOUNTER — Ambulatory Visit: Payer: Medicare Other | Admitting: Cardiology

## 2019-04-29 VITALS — BP 140/90 | HR 97 | Ht 66.75 in | Wt 164.4 lb

## 2019-04-29 DIAGNOSIS — I4819 Other persistent atrial fibrillation: Secondary | ICD-10-CM

## 2019-04-29 DIAGNOSIS — I10 Essential (primary) hypertension: Secondary | ICD-10-CM | POA: Diagnosis not present

## 2019-04-29 DIAGNOSIS — I4891 Unspecified atrial fibrillation: Secondary | ICD-10-CM | POA: Diagnosis not present

## 2019-04-29 DIAGNOSIS — R011 Cardiac murmur, unspecified: Secondary | ICD-10-CM

## 2019-04-29 MED ORDER — APIXABAN 5 MG PO TABS: 5.0000 mg | ORAL_TABLET | Freq: Two times a day (BID) | ORAL | 11 refills | Status: DC

## 2019-04-29 MED ORDER — DILTIAZEM HCL ER COATED BEADS 120 MG PO CP24: 120.0000 mg | ORAL_CAPSULE | Freq: Every day | ORAL | 3 refills | Status: DC

## 2019-04-29 NOTE — Progress Notes (Signed)
Cardiology Office Note:    Date:  04/29/2019   ID:  Judy GUGLIUZZA, DOB 12-22-1932, MRN [id number]  PCP:  Judy Bush, MD  Cardiologist:  Judy Furbish, MD  Electrophysiologist:  None   Referring MD: Judy Bush, MD     History of Present Illness:    Judy Walker is a 85 y.o. female here for the evaluation of atrial fibrillation and systolic murmur at the request of Dr. Danise Walker.  EKG on 03/26/2019 demonstrates A. fib with heart rate in the 80s.  Does not appear that she has been placed on anticoagulation.  Sits down or dizzy at times. Paces.   No tob  Father was 78's with CAD. All brother with heart issues.   11kids only girl.   Mild SOB with working hards. No CP. No syncope.   Past Medical History:  Diagnosis Date  . Benign positional vertigo 2005  . Colonoscopy refused    " I elected not to have one" Muse reviewed 07/06/10; she continues to decline colonscopy  . Heart murmur   . HLD (hyperlipidemia)    total cholesterol 340 in 2003, NHDL 61, TG 75. NMR Lipoprofile 2005: LDL 84 (933/285), LDL glad =<120 ideally <90. Framingham study LDL goal <130  . HTN (hypertension)   . Hypothyroidism   . Osteopenia 08/2012, 09/2015   DEXA -1.9 (2014) -2.3 (2017)    Past Surgical History:  Procedure Laterality Date  . ABDOMINAL HYSTERECTOMY  2003   & BSO for cystic ovaries . No PMH of abnormal PAP  . CARDIAC CATHETERIZATION     normal  . CATARACT EXTRACTION, BILATERAL  09/2011   Dr.[name]   . CYSTOSCOPY     bladder @ age 71 for painful hematuria    Current Medications: Current Meds  Medication Sig  . atorvastatin (LIPITOR) 10 MG tablet TAKE 1 TABLET BY MOUTH EVERY DAY  . calcium gluconate 500 MG tablet Take 500 mg by mouth daily.    . Cholecalciferol (VITAMIN D3) 25 MCG (1000 UT) CAPS Take 1 capsule (1,000 Units total) by mouth daily.  . diphenhydrAMINE (BENADRYL) 25 mg capsule Take 12.5-25 mg by mouth at bedtime as needed for sleep.  .  hydrochlorothiazide (HYDRODIURIL) 25 MG tablet TAKE 1 TABLET BY MOUTH EVERY DAY  . levothyroxine (SYNTHROID) 75 MCG tablet TAKE 1 TABLET BY MOUTH EVERY DAY  . losartan (COZAAR) 100 MG tablet TAKE 1 TABLET BY MOUTH EVERY DAY  . Multiple Vitamins-Minerals (MULTIVITAMIN ADULT) TABS Take 1 tablet by mouth daily.  . Potassium 99 MG TABS Take 1 tablet (99 mg total) by mouth daily.  . [DISCONTINUED] amLODipine (NORVASC) 2.5 MG tablet TAKE 1 TABLET BY MOUTH EVERY DAY  . [DISCONTINUED] aspirin EC 81 MG tablet Take 81 mg by mouth daily.     Allergies:   Patient has no known allergies.   Social History   Socioeconomic History  . Marital status: Married    Spouse name: Not on file  . Number of children: Not on file  . Years of education: Not on file  . Highest education level: Not on file  Occupational History  . Occupation: retired  Scientific laboratory technician  . Financial resource strain: Not hard at all  . Food insecurity    Worry: Never true    Inability: Never true  . Transportation needs    Medical: No    Non-medical: No  Tobacco Use  . Smoking status: Never Smoker  . Smokeless tobacco: Never Used  Substance and Sexual Activity  .  Alcohol use: Yes    Comment:  RARELY  . Drug use: No  . Sexual activity: Not on file  Lifestyle  . Physical activity    Days per week: 0 days    Minutes per session: 0 min  . Stress: Not at all  Relationships  . Social Herbalist on phone: Not on file    Gets together: Not on file    Attends religious service: Not on file    Active member of club or organization: Not on file    Attends meetings of clubs or organizations: Not on file    Relationship status: Not on file  Other Topics Concern  . Not on file  Social History Narrative   Lives alone. Widow 2012   Daughter lives nearby.   Occupation: retired Clinical cytogeneticist   Was Programmer, multimedia growing up   Activity: Regular exercise - walking 1-2 miles daily, stays active in yard.   Diet: daily  fruits/vegetables, some water     Family History: The patient's family history includes Alzheimer's disease in her sister; Brain cancer in her brother; Breast cancer in her sister; Cirrhosis in her sister; Diabetes in her father and another family member; Esophageal cancer in her sister; Heart attack in her brother and father; Kidney cancer in her mother; Ovarian cancer in her sister; Stroke (age of onset: 40) in her sister.  ROS:   Please see the history of present illness.     All other systems reviewed and are negative.  EKGs/Labs/Other Studies Reviewed:    The following studies were reviewed today: Echocardiogram pending.  Prior EKG reviewed demonstrating A. fib  EKG: 03/26/2019-atrial fibrillation 84 bpm  Recent Labs: 03/26/2019: ALT 15; BUN 22; Creatinine, Ser 1.03; Hemoglobin 13.7; Platelets 248.0; Potassium 3.9; Sodium 140; TSH 3.82  Recent Lipid Panel    Component Value Date/Time   CHOL 186 03/26/2019 1203   TRIG 146.0 03/26/2019 1203   TRIG 63 05/17/2006 1034   HDL 56.70 03/26/2019 1203   CHOLHDL 3 03/26/2019 1203   VLDL 29.2 03/26/2019 1203   LDLCALC 100 (H) 03/26/2019 1203   LDLDIRECT 97.0 09/10/2015 1158    Physical Exam:    VS:  BP 140/90   Pulse 97   Ht 5' 6.75" (1.695 m)   Wt 164 lb 6.4 oz (74.6 kg)   SpO2 99%   BMI 25.94 kg/m     Wt Readings from Last 3 Encounters:  04/29/19 164 lb 6.4 oz (74.6 kg)  03/26/19 164 lb 4 oz (74.5 kg)  09/28/17 158 lb (71.7 kg)     GEN: Looks younger than age, well nourished, well developed in no acute distress HEENT: Normal NECK: No JVD; No carotid bruits LYMPHATICS: No lymphadenopathy CARDIAC: irreg [id number]  systolic murmur, no rubs, gallops RESPIRATORY:  Clear to auscultation without rales, wheezing or rhonchi  ABDOMEN: Soft, non-tender, non-distended MUSCULOSKELETAL:  No edema; No deformity  SKIN: Warm and dry NEUROLOGIC:  Alert and oriented x 3 PSYCHIATRIC:  Normal affect   ASSESSMENT:    1. Atrial  fibrillation, new onset ([place])   2. Essential hypertension   3. Systolic murmur   4. Persistent atrial fibrillation (HCC)    PLAN:    In order of problems listed above:  Persistent atrial fibrillation -We will go ahead and start Eliquis 5 mg twice a day for stroke prevention.  Bleeding risks discussed.  Hemoglobin 13.7 and creatinine 1.0 Continue to monitor blood work every 6 months. -Start diltiazem  CD 120 mg once a day.  Try to improve rate control slightly.  This may help her sensation of having to sit down when overexerting herself.  We will cancel the amlodipine 2.5 mg since she is going to be on diltiazem -Stop aspirin 81 mg since we are starting Eliquis.  Reduce bleeding effects.  Essential hypertension -Stopping amlodipine since we are starting diltiazem low-dose -Continue with HCTZ 25 and Cozaar 100  In 1 month we will have her follow-up with APP to check on heart rate and to make sure she is tolerating her Eliquis well.  6 months with me.  Medication Adjustments/Labs and Tests Ordered: Current medicines are reviewed at length with the patient today.  Concerns regarding medicines are outlined above.  Orders Placed This Encounter  Procedures  . ECHOCARDIOGRAM COMPLETE   Meds ordered this encounter  Medications  . diltiazem (CARDIZEM CD) 120 MG 24 hr capsule    Sig: Take 1 capsule (120 mg total) by mouth daily.    Dispense:  90 capsule    Refill:  3  . apixaban (ELIQUIS) 5 MG TABS tablet    Sig: Take 1 tablet (5 mg total) by mouth 2 (two) times daily.    Dispense:  60 tablet    Refill:  11    Patient Instructions  Medication Instructions:  Please discontinue your Asprin and Amlodipine. Start Diltiazem 120 mg once daily and Eliquis 5 mg one tablet twice daily. Continue all other medications as listed.  *If you need a refill on your cardiac medications before your next appointment, please call your pharmacy*  Testing/Procedures: Your physician has requested that  you have an echocardiogram. Echocardiography is a painless test that uses sound waves to create images of your heart. It provides your doctor with information about the size and shape of your heart and how well your heart's chambers and valves are working. This procedure takes approximately one hour. There are no restrictions for this procedure.  Follow-Up: At [facility], you and your health needs are our priority.  As part of our continuing mission to provide you with exceptional heart care, we have created designated Provider Care Teams.  These Care Teams include your primary Cardiologist (physician) and Advanced Practice Providers (APPs -  Physician Assistants and Nurse Practitioners) who all work together to provide you with the care you need, when you need it.  Your next appointment:   1 month(s)  The format for your next appointment:   In Person  Provider:   (with APP) You may see Judy Furbish, MD or one of the following Advanced Practice Providers on your designated Care Team:    Truitt Merle, NP  Cecilie Kicks, NP  Kathyrn Drown, NP   Thank you for choosing [facility]!!      Echocardiogram An echocardiogram is a procedure that uses painless sound waves (ultrasound) to produce an image of the heart. Images from an echocardiogram can provide important information about:  Signs of coronary artery disease (CAD).  Aneurysm detection. An aneurysm is a weak or damaged part of an artery wall that bulges out from the normal force of blood pumping through the body.  Heart size and shape. Changes in the size or shape of the heart can be associated with certain conditions, including heart failure, aneurysm, and CAD.  Heart muscle function.  Heart valve function.  Signs of a past heart attack.  Fluid buildup around the heart.  Thickening of the heart muscle.  A tumor or  infectious growth around the heart valves. Tell a health care provider about:  Any  allergies you have.  All medicines you are taking, including vitamins, herbs, eye drops, creams, and over-the-counter medicines.  Any blood disorders you have.  Any surgeries you have had.  Any medical conditions you have.  Whether you are pregnant or may be pregnant. What are the risks? Generally, this is a safe procedure. However, problems may occur, including:  Allergic reaction to dye (contrast) that may be used during the procedure. What happens before the procedure? No specific preparation is needed. You may eat and drink normally. What happens during the procedure?   An IV tube may be inserted into one of your veins.  You may receive contrast through this tube. A contrast is an injection that improves the quality of the pictures from your heart.  A gel will be applied to your chest.  A wand-like tool (transducer) will be moved over your chest. The gel will help to transmit the sound waves from the transducer.  The sound waves will harmlessly bounce off of your heart to allow the heart images to be captured in real-time motion. The images will be recorded on a computer. The procedure may vary among health care providers and hospitals. What happens after the procedure?  You may return to your normal, everyday life, including diet, activities, and medicines, unless your health care provider tells you not to do that. Summary  An echocardiogram is a procedure that uses painless sound waves (ultrasound) to produce an image of the heart.  Images from an echocardiogram can provide important information about the size and shape of your heart, heart muscle function, heart valve function, and fluid buildup around your heart.  You do not need to do anything to prepare before this procedure. You may eat and drink normally.  After the echocardiogram is completed, you may return to your normal, everyday life, unless your health care provider tells you not to do that. This  information is not intended to replace advice given to you by your health care provider. Make sure you discuss any questions you have with your health care provider. Document Released: 05/20/2000 Document Revised: 09/13/2018 Document Reviewed: 06/25/2016 Elsevier Patient Education  2020 [employer].   Apixaban oral tablets What is this medicine? APIXABAN (a PIX a ban) is an anticoagulant (blood thinner). It is used to lower the chance of stroke in people with a medical condition called atrial fibrillation. It is also used to treat or prevent blood clots in the lungs or in the veins. This medicine may be used for other purposes; ask your health care provider or pharmacist if you have questions. COMMON BRAND NAME(S): Eliquis What should I tell my health care provider before I take this medicine? They need to know if you have any of these conditions:  antiphospholipid antibody syndrome  bleeding disorders  bleeding in the brain  blood in your stools (black or tarry stools) or if you have blood in your vomit  history of blood clots  history of stomach bleeding  kidney disease  liver disease  mechanical heart valve  an unusual or allergic reaction to apixaban, other medicines, foods, dyes, or preservatives  pregnant or trying to get pregnant  breast-feeding How should I use this medicine? Take this medicine by mouth with a glass of water. Follow the directions on the prescription label. You can take it with or without food. If it upsets your stomach, take it with food. Take  your medicine at regular intervals. Do not take it more often than directed. Do not stop taking except on your doctor's advice. Stopping this medicine may increase your risk of a blood clot. Be sure to refill your prescription before you run out of medicine. Talk to your pediatrician regarding the use of this medicine in children. Special care may be needed. Overdosage: If you think you have taken too much of  this medicine contact a poison control center or emergency room at once. NOTE: This medicine is only for you. Do not share this medicine with others. What if I miss a dose? If you miss a dose, take it as soon as you can. If it is almost time for your next dose, take only that dose. Do not take double or extra doses. What may interact with this medicine? This medicine may interact with the following:  aspirin and aspirin-like medicines  certain medicines for fungal infections like ketoconazole and itraconazole  certain medicines for seizures like carbamazepine and phenytoin  certain medicines that treat or prevent blood clots like warfarin, enoxaparin, and dalteparin  clarithromycin  NSAIDs, medicines for pain and inflammation, like ibuprofen or naproxen  rifampin  ritonavir  St. John's wort This list may not describe all possible interactions. Give your health care provider a list of all the medicines, herbs, non-prescription drugs, or dietary supplements you use. Also tell them if you smoke, drink alcohol, or use illegal drugs. Some items may interact with your medicine. What should I watch for while using this medicine? Visit your healthcare professional for regular checks on your progress. You may need blood work done while you are taking this medicine. Your condition will be monitored carefully while you are receiving this medicine. It is important not to miss any appointments. Avoid sports and activities that might cause injury while you are using this medicine. Severe falls or injuries can cause unseen bleeding. Be careful when using sharp tools or knives. Consider using an Copy. Take special care brushing or flossing your teeth. Report any injuries, bruising, or red spots on the skin to your healthcare professional. If you are going to need surgery or other procedure, tell your healthcare professional that you are taking this medicine. Wear a medical ID bracelet or  chain. Carry a card that describes your disease and details of your medicine and dosage times. What side effects may I notice from receiving this medicine? Side effects that you should report to your doctor or health care professional as soon as possible:  allergic reactions like skin rash, itching or hives, swelling of the face, lips, or tongue  signs and symptoms of bleeding such as bloody or black, tarry stools; red or dark-brown urine; spitting up blood or brown material that looks like coffee grounds; red spots on the skin; unusual bruising or bleeding from the eye, gums, or nose  signs and symptoms of a blood clot such as chest pain; shortness of breath; pain, swelling, or warmth in the leg  signs and symptoms of a stroke such as changes in vision; confusion; trouble speaking or understanding; severe headaches; sudden numbness or weakness of the face, arm or leg; trouble walking; dizziness; loss of coordination This list may not describe all possible side effects. Call your doctor for medical advice about side effects. You may report side effects to FDA at 1-800-FDA-1088. Where should I keep my medicine? Keep out of the reach of children. Store at room temperature between 20 and 25 degrees C (68  and 77 degrees F). Throw away any unused medicine after the expiration date. NOTE: This sheet is a summary. It may not cover all possible information. If you have questions about this medicine, talk to your doctor, pharmacist, or health care provider.  2020 Elsevier/Gold Standard (2018-01-31 17:39:34)     Signed, Judy Furbish, MD  04/29/2019 11:57 AM    [facility]

## 2019-04-29 NOTE — Patient Instructions (Signed)
Medication Instructions:  Please discontinue your Asprin and Amlodipine. Start Diltiazem 120 mg once daily and Eliquis 5 mg one tablet twice daily. Continue all other medications as listed.  *If you need a refill on your cardiac medications before your next appointment, please call your pharmacy*  Testing/Procedures: Your physician has requested that you have an echocardiogram. Echocardiography is a painless test that uses sound waves to create images of your heart. It provides your doctor with information about the size and shape of your heart and how well your hearts chambers and valves are working. This procedure takes approximately one hour. There are no restrictions for this procedure.  Follow-Up: At [facility], you and your health needs are our priority.  As part of our continuing mission to provide you with exceptional heart care, we have created designated Provider Care Teams.  These Care Teams include your primary Cardiologist (physician) and Advanced Practice Providers (APPs -  Physician Assistants and Nurse Practitioners) who all work together to provide you with the care you need, when you need it.  Your next appointment:   1 month(s)  The format for your next appointment:   In Person  Provider:   (with APP) You may see [name], MD or one of the following Advanced Practice Providers on your designated Care Team:    Truitt Merle, NP  Cecilie Kicks, NP  Kathyrn Drown, NP   Thank you for choosing [facility]!!      Echocardiogram An echocardiogram is a procedure that uses painless sound waves (ultrasound) to produce an image of the heart. Images from an echocardiogram can provide important information about:  Signs of coronary artery disease (CAD).  Aneurysm detection. An aneurysm is a weak or damaged part of an artery wall that bulges out from the normal force of blood pumping through the body.  Heart size and shape. Changes in the size or shape  of the heart can be associated with certain conditions, including heart failure, aneurysm, and CAD.  Heart muscle function.  Heart valve function.  Signs of a past heart attack.  Fluid buildup around the heart.  Thickening of the heart muscle.  A tumor or infectious growth around the heart valves. Tell a health care provider about:  Any allergies you have.  All medicines you are taking, including vitamins, herbs, eye drops, creams, and over-the-counter medicines.  Any blood disorders you have.  Any surgeries you have had.  Any medical conditions you have.  Whether you are pregnant or may be pregnant. What are the risks? Generally, this is a safe procedure. However, problems may occur, including:  Allergic reaction to dye (contrast) that may be used during the procedure. What happens before the procedure? No specific preparation is needed. You may eat and drink normally. What happens during the procedure?   An IV tube may be inserted into one of your veins.  You may receive contrast through this tube. A contrast is an injection that improves the quality of the pictures from your heart.  A gel will be applied to your chest.  A wand-like tool (transducer) will be moved over your chest. The gel will help to transmit the sound waves from the transducer.  The sound waves will harmlessly bounce off of your heart to allow the heart images to be captured in real-time motion. The images will be recorded on a computer. The procedure may vary among health care providers and hospitals. What happens after the procedure?  You may return to  your normal, everyday life, including diet, activities, and medicines, unless your health care provider tells you not to do that. Summary  An echocardiogram is a procedure that uses painless sound waves (ultrasound) to produce an image of the heart.  Images from an echocardiogram can provide important information about the size and shape of  your heart, heart muscle function, heart valve function, and fluid buildup around your heart.  You do not need to do anything to prepare before this procedure. You may eat and drink normally.  After the echocardiogram is completed, you may return to your normal, everyday life, unless your health care provider tells you not to do that. This information is not intended to replace advice given to you by your health care provider. Make sure you discuss any questions you have with your health care provider. Document Released: 05/20/2000 Document Revised: 09/13/2018 Document Reviewed: 06/25/2016 Elsevier Patient Education  2020 [place].   Apixaban oral tablets What is this medicine? APIXABAN (a PIX a ban) is an anticoagulant (blood thinner). It is used to lower the chance of stroke in people with a medical condition called atrial fibrillation. It is also used to treat or prevent blood clots in the lungs or in the veins. This medicine may be used for other purposes; ask your health care provider or pharmacist if you have questions. COMMON BRAND NAME(S): Eliquis What should I tell my health care provider before I take this medicine? They need to know if you have any of these conditions:  antiphospholipid antibody syndrome  bleeding disorders  bleeding in the brain  blood in your stools (black or tarry stools) or if you have blood in your vomit  history of blood clots  history of stomach bleeding  kidney disease  liver disease  mechanical heart valve  an unusual or allergic reaction to apixaban, other medicines, foods, dyes, or preservatives  pregnant or trying to get pregnant  breast-feeding How should I use this medicine? Take this medicine by mouth with a glass of water. Follow the directions on the prescription label. You can take it with or without food. If it upsets your stomach, take it with food. Take your medicine at regular intervals. Do not take it more often than  directed. Do not stop taking except on your doctor's advice. Stopping this medicine may increase your risk of a blood clot. Be sure to refill your prescription before you run out of medicine. Talk to your pediatrician regarding the use of this medicine in children. Special care may be needed. Overdosage: If you think you have taken too much of this medicine contact a poison control center or emergency room at once. NOTE: This medicine is only for you. Do not share this medicine with others. What if I miss a dose? If you miss a dose, take it as soon as you can. If it is almost time for your next dose, take only that dose. Do not take double or extra doses. What may interact with this medicine? This medicine may interact with the following:  aspirin and aspirin-like medicines  certain medicines for fungal infections like ketoconazole and itraconazole  certain medicines for seizures like carbamazepine and phenytoin  certain medicines that treat or prevent blood clots like warfarin, enoxaparin, and dalteparin  clarithromycin  NSAIDs, medicines for pain and inflammation, like ibuprofen or naproxen  rifampin  ritonavir  St. John's wort This list may not describe all possible interactions. Give your health care provider a list of all the  medicines, herbs, non-prescription drugs, or dietary supplements you use. Also tell them if you smoke, drink alcohol, or use illegal drugs. Some items may interact with your medicine. What should I watch for while using this medicine? Visit your healthcare professional for regular checks on your progress. You may need blood work done while you are taking this medicine. Your condition will be monitored carefully while you are receiving this medicine. It is important not to miss any appointments. Avoid sports and activities that might cause injury while you are using this medicine. Severe falls or injuries can cause unseen bleeding. Be careful when using sharp  tools or knives. Consider using an Copy. Take special care brushing or flossing your teeth. Report any injuries, bruising, or red spots on the skin to your healthcare professional. If you are going to need surgery or other procedure, tell your healthcare professional that you are taking this medicine. Wear a medical ID bracelet or chain. Carry a card that describes your disease and details of your medicine and dosage times. What side effects may I notice from receiving this medicine? Side effects that you should report to your doctor or health care professional as soon as possible:  allergic reactions like skin rash, itching or hives, swelling of the face, lips, or tongue  signs and symptoms of bleeding such as bloody or black, tarry stools; red or dark-brown urine; spitting up blood or brown material that looks like coffee grounds; red spots on the skin; unusual bruising or bleeding from the eye, gums, or nose  signs and symptoms of a blood clot such as chest pain; shortness of breath; pain, swelling, or warmth in the leg  signs and symptoms of a stroke such as changes in vision; confusion; trouble speaking or understanding; severe headaches; sudden numbness or weakness of the face, arm or leg; trouble walking; dizziness; loss of coordination This list may not describe all possible side effects. Call your doctor for medical advice about side effects. You may report side effects to FDA at 1-800-FDA-1088. Where should I keep my medicine? Keep out of the reach of children. Store at room temperature between 20 and 25 degrees C (68 and 77 degrees F). Throw away any unused medicine after the expiration date. NOTE: This sheet is a summary. It may not cover all possible information. If you have questions about this medicine, talk to your doctor, pharmacist, or health care provider.  2020 Elsevier/Gold Standard (2018-01-31 17:39:34)

## 2019-05-15 ENCOUNTER — Ambulatory Visit (HOSPITAL_COMMUNITY): Payer: Medicare Other | Attending: Internal Medicine

## 2019-05-15 ENCOUNTER — Other Ambulatory Visit: Payer: Self-pay

## 2019-05-15 DIAGNOSIS — I4891 Unspecified atrial fibrillation: Secondary | ICD-10-CM

## 2019-05-15 DIAGNOSIS — R011 Cardiac murmur, unspecified: Secondary | ICD-10-CM | POA: Diagnosis not present

## 2019-05-16 ENCOUNTER — Telehealth: Payer: Self-pay | Admitting: Cardiology

## 2019-05-16 NOTE — Telephone Encounter (Signed)
Shows normal pump function with mild aortic stenosis or narrowing of the aortic valve. We will continue to monitor clinically. This is the reason for the heart murmur.  Judy Furbish, MD   Reviewed results with patient who states understanding.  She expressed concern regarding the cost of Eliquis but states she will discuss this with Dr Marlou Porch at her f/u in January.

## 2019-05-16 NOTE — Telephone Encounter (Signed)
New Message    Pt is calling for Echo Results    Please call

## 2019-05-25 ENCOUNTER — Other Ambulatory Visit: Payer: Self-pay | Admitting: Family Medicine

## 2019-06-18 ENCOUNTER — Encounter: Payer: Self-pay | Admitting: Cardiology

## 2019-06-18 ENCOUNTER — Encounter: Payer: Self-pay | Admitting: *Deleted

## 2019-06-18 ENCOUNTER — Ambulatory Visit: Payer: Medicare Other | Admitting: Cardiology

## 2019-06-18 ENCOUNTER — Other Ambulatory Visit: Payer: Self-pay

## 2019-06-18 VITALS — BP 140/90 | HR 100 | Ht 66.75 in | Wt 166.4 lb

## 2019-06-18 DIAGNOSIS — I4891 Unspecified atrial fibrillation: Secondary | ICD-10-CM | POA: Diagnosis not present

## 2019-06-18 DIAGNOSIS — I10 Essential (primary) hypertension: Secondary | ICD-10-CM | POA: Diagnosis not present

## 2019-06-18 DIAGNOSIS — Z01812 Encounter for preprocedural laboratory examination: Secondary | ICD-10-CM

## 2019-06-18 DIAGNOSIS — Z79899 Other long term (current) drug therapy: Secondary | ICD-10-CM

## 2019-06-18 LAB — BASIC METABOLIC PANEL
BUN/Creatinine Ratio: 26 (ref 12–28)
BUN: 24 mg/dL (ref 8–27)
CO2: 27 mmol/L (ref 20–29)
Calcium: 9.7 mg/dL (ref 8.7–10.3)
Chloride: 98 mmol/L (ref 96–106)
Creatinine, Ser: 0.94 mg/dL (ref 0.57–1.00)
GFR calc Af Amer: 64 mL/min/{1.73_m2} (ref >59)
GFR calc non Af Amer: 56 mL/min/{1.73_m2} — ABNORMAL LOW (ref >59)
Glucose: 98 mg/dL (ref 65–99)
Potassium: 4.7 mmol/L (ref 3.5–5.2)
Sodium: 142 mmol/L (ref 134–144)

## 2019-06-18 LAB — CBC
Hematocrit: 38.6 % (ref 34.0–46.6)
Hemoglobin: 13.5 g/dL (ref 11.1–15.9)
MCH: 34.9 pg — ABNORMAL HIGH (ref 26.6–33.0)
MCHC: 35 g/dL (ref 31.5–35.7)
MCV: 100 fL — ABNORMAL HIGH (ref 79–97)
Platelets: 265 10*3/uL (ref 150–450)
RBC: 3.87 x10E6/uL (ref 3.77–5.28)
RDW: 11.3 % — ABNORMAL LOW (ref 11.7–15.4)
WBC: 6.2 10*3/uL (ref 3.4–10.8)

## 2019-06-18 NOTE — Progress Notes (Signed)
Cardiology Office Note:    Date:  06/18/2019   ID:  Judy Walker, DOB [birth year], MRN [id number]  PCP:  Ria Bush, MD  Cardiologist:  [name], MD  Electrophysiologist:  None   Referring MD: Ria Bush, MD     History of Present Illness:    Judy Walker is a 86 y.o. female here for follow-up of atrial fibrillation. At prior visit, we started diltiazem CD 120 once a day.  Stopped her amlodipine at that time.  Heart rate still is in the low 100s.  Not adequately rate controlled. Diltiazem CD 120 mg started to help improve rate control, when she started it she did have some constipation, she is taking a stool softener.  Could be a side effect of this medication.   Her heart rate still is quite fast and I would like to give her an opportunity at sinus rhythm now.  EKG on 03/26/2019 demonstrates A. fib with heart rate in the 80s.  Sits down or dizzy at times. Paces.   No tob  Father was 80's with CAD. All brother with heart issues.   11kids only girl.   Mild SOB when exerting herself. No CP. No syncope.  Sometimes gets some swelling in her feet when she is at the beach.  Past Medical History:  Diagnosis Date  . Benign positional vertigo 2005  . Colonoscopy refused    " I elected not to have one" [place] reviewed 07/06/10; she continues to decline colonscopy  . Heart murmur   . HLD (hyperlipidemia)    total cholesterol 340 in 2003, NHDL 61, TG 75. NMR Lipoprofile 2005: LDL 84 (933/285), LDL glad =<120 ideally <90. Fr[place]ham study LDL goal <130  . HTN (hypertension)   . Hypothyroidism   . Osteopenia 08/2012, 09/2015   DEXA -1.9 (2014) -2.3 (2017)    Past Surgical History:  Procedure Laterality Date  . ABDOMINAL HYSTERECTOMY  2003   & BSO for cystic ovaries . No PMH of abnormal PAP  . CARDIAC CATHETERIZATION     normal  . CATARACT EXTRACTION, BILATERAL  09/2011   Dr.[name]   . CYSTOSCOPY     bladder @ age 68 for painful hematuria    Current  Medications: Current Meds  Medication Sig  . apixaban (ELIQUIS) 5 MG TABS tablet Take 1 tablet (5 mg total) by mouth 2 (two) times daily.  Marland Kitchen atorvastatin (LIPITOR) 10 MG tablet TAKE 1 TABLET BY MOUTH EVERY DAY  . calcium gluconate 500 MG tablet Take 500 mg by mouth daily.    . Cholecalciferol (VITAMIN D3) 25 MCG (1000 UT) CAPS Take 1 capsule (1,000 Units total) by mouth daily.  Marland Kitchen diltiazem (CARDIZEM CD) 120 MG 24 hr capsule Take 1 capsule (120 mg total) by mouth daily.  . diphenhydrAMINE (BENADRYL) 25 mg capsule Take 12.5-25 mg by mouth at bedtime as needed for sleep.  . hydrochlorothiazide (HYDRODIURIL) 25 MG tablet TAKE 1 TABLET BY MOUTH EVERY DAY  . levothyroxine (SYNTHROID) 75 MCG tablet TAKE 1 TABLET BY MOUTH EVERY DAY  . losartan (COZAAR) 100 MG tablet TAKE 1 TABLET BY MOUTH EVERY DAY  . Multiple Vitamins-Minerals (MULTIVITAMIN ADULT) TABS Take 1 tablet by mouth daily.  . Potassium 99 MG TABS Take 1 tablet (99 mg total) by mouth daily.     Allergies:   Patient has no known allergies.   Social History   Socioeconomic History  . Marital status: Married    Spouse name: Not on file  . Number  of children: Not on file  . Years of education: Not on file  . Highest education level: Not on file  Occupational History  . Occupation: retired  Tobacco Use  . Smoking status: Never Smoker  . Smokeless tobacco: Never Used  Substance and Sexual Activity  . Alcohol use: Yes    Comment:  RARELY  . Drug use: No  . Sexual activity: Not on file  Other Topics Concern  . Not on file  Social History Narrative   Lives alone. Widow 2012   Daughter lives nearby.   Occupation: retired Clinical cytogeneticist   Was Programmer, multimedia growing up   Activity: Regular exercise - walking 1-2 miles daily, stays active in yard.   Diet: daily fruits/vegetables, some water   Social Determinants of Health   Financial Resource Strain: Low Risk   . Difficulty of Paying Living Expenses: Not hard at all  Food  Insecurity: No Food Insecurity  . Worried About Charity fundraiser in the Last Year: Never true  . Ran Out of Food in the Last Year: Never true  Transportation Needs: No Transportation Needs  . Lack of Transportation (Medical): No  . Lack of Transportation (Non-Medical): No  Physical Activity: Inactive  . Days of Exercise per Week: 0 days  . Minutes of Exercise per Session: 0 min  Stress: No Stress Concern Present  . Feeling of Stress : Not at all  Social Connections:   . Frequency of Communication with Friends and Family: Not on file  . Frequency of Social Gatherings with Friends and Family: Not on file  . Attends Religious Services: Not on file  . Active Member of Clubs or Organizations: Not on file  . Attends Archivist Meetings: Not on file  . Marital Status: Not on file     Family History: The patient's family history includes Alzheimer's disease in her sister; Brain cancer in her brother; Breast cancer in her sister; Cirrhosis in her sister; Diabetes in her father and another family member; Esophageal cancer in her sister; Heart attack in her brother and father; Kidney cancer in her mother; Ovarian cancer in her sister; Stroke (age of onset: 6) in her sister.  ROS:   Please see the history of present illness.     All other systems reviewed and are negative.  EKGs/Labs/Other Studies Reviewed:    The following studies were reviewed today: Echocardiogram:05/15/19   1. Left ventricular ejection fraction, by visual estimation, is 60 to 65%. The left ventricle has normal function. There is mildly increased left ventricular hypertrophy.  2. Left ventricular diastolic parameters are indeterminate.  3. Global right ventricle has normal systolic function.The right ventricular size is normal. No increase in right ventricular wall thickness.  4. Left atrial size was normal.  5. Right atrial size was mild-moderately dilated.  6. Mild mitral annular calcification.  7. The  mitral valve is abnormal. Mild mitral valve regurgitation. No evidence of mitral stenosis.  8. The tricuspid valve is normal in structure. Tricuspid valve regurgitation mild-moderate.  9. The aortic valve is abnormal. Aortic valve regurgitation is trivial. Mild aortic valve stenosis. Moderate reduced leaflet excursion. 10. Aortic valve mean gradient measures 10.3 mmHg. 11. The pulmonic valve was normal in structure. Pulmonic valve regurgitation is trivial. 12. Normal pulmonary artery systolic pressure. 13. The inferior vena cava is normal in size with greater than 50% respiratory variability, suggesting right atrial pressure of 3 mmHg. 14. The left ventricle has no regional wall motion abnormalities.  Prior EKG reviewed demonstrating A. fib  EKG: 03/26/2019-atrial fibrillation 84 bpm  Recent Labs: 03/26/2019: ALT 15; BUN 22; Creatinine, Ser 1.03; Hemoglobin 13.7; Platelets 248.0; Potassium 3.9; Sodium 140; TSH 3.82  Recent Lipid Panel    Component Value Date/Time   CHOL 186 03/26/2019 1203   TRIG 146.0 03/26/2019 1203   TRIG 63 05/17/2006 1034   HDL 56.70 03/26/2019 1203   CHOLHDL 3 03/26/2019 1203   VLDL 29.2 03/26/2019 1203   LDLCALC 100 (H) 03/26/2019 1203   LDLDIRECT 97.0 09/10/2015 1158    Physical Exam:    VS:  BP 140/90   Pulse 100   Ht 5' 6.75" (1.695 m)   Wt 166 lb 6.4 oz (75.5 kg)   SpO2 99%   BMI 26.26 kg/m     Wt Readings from Last 3 Encounters:  06/18/19 166 lb 6.4 oz (75.5 kg)  04/29/19 164 lb 6.4 oz (74.6 kg)  03/26/19 164 lb 4 oz (74.5 kg)     GEN: Well nourished, well developed, in no acute distress  HEENT: normal  Neck: no JVD, carotid bruits, or masses Cardiac: Tachycardic IRRR; 2/6 systolic murmur,no  rubs, or gallops,no edema  Respiratory:  clear to auscultation bilaterally, normal work of breathing GI: soft, nontender, nondistended, + BS MS: no deformity or atrophy  Skin: warm and dry, no rash Neuro:  Alert and Oriented x 3, Strength and  sensation are intact Psych: euthymic mood, full affect   ASSESSMENT:    1. Essential hypertension   2. Atrial fibrillation, new onset ([place])   3. Pre-procedural laboratory examination   4. Medication management    PLAN:    In order of problems listed above:  Persistent atrial fibrillation -I would like to give her an opportunity at sinus rhythm given that she is still mildly tachycardic.  Her EF is normal on echo.  She does not describe any significant symptoms.  We will get her set up for cardioversion.  Risks and benefits have been explained including potential pacemaker needs. -Eliquis has been present for over a month, hemoglobin 13.7 and creatinine 1.0 Continue to monitor blood work every 6 months. -Continue diltiazem CD 120 mg once a day since after cardioversion, her heart rate may be decreased.  If cardioversion is unsuccessful or she reverts back to atrial fibrillation with heart rates around 100, we will increase her diltiazem at that time.   Essential hypertension -Doing fairly well, medications reviewed.  Have her follow-up in 3 to 4 weeks after cardioversion with APP.  Medication Adjustments/Labs and Tests Ordered: Current medicines are reviewed at length with the patient today.  Concerns regarding medicines are outlined above.  Orders Placed This Encounter  Procedures  . CBC  . Basic Metabolic Panel (BMET)   No orders of the defined types were placed in this encounter.   Patient Instructions  Medication Instructions:  The current medical regimen is effective;  continue present plan and medications.  *If you need a refill on your cardiac medications before your next appointment, please call your pharmacy*  Lab Work: Please have blood work today. (CBC, BMP) If you have labs (blood work) drawn today and your tests are completely normal, you will receive your results only by: Marland Kitchen MyChart Message (if you have MyChart) OR . A paper copy in the mail If you have any  lab test that is abnormal or we need to change your treatment, we will call you to review the results.  Testing/Procedures: Your physician has requested that  you have a Cardioversion.  Electrical Cardioversion uses a jolt of electricity to your heart either through paddles or wired patches attached to your chest. This is a controlled, usually prescheduled, procedure. This procedure is done at the hospital and you are not awake during the procedure. You usually go home the day of the procedure. Please see the instruction sheet given to you today for more information.  Follow-Up: At [facility], you and your health needs are our priority.  As part of our continuing mission to provide you with exceptional heart care, we have created designated Provider Care Teams.  These Care Teams include your primary Cardiologist (physician) and Advanced Practice Providers (APPs -  Physician Assistants and Nurse Practitioners) who all work together to provide you with the care you need, when you need it.  Your next appointment:   4 week(s)  The format for your next appointment:   In Person  Provider:   [name], MD  Thank you for choosing [facility]!!        Signed, [name], MD  06/18/2019 12:21 PM    [place]

## 2019-06-18 NOTE — Addendum Note (Signed)
Addended by: Jerline Pain on: 06/18/2019 03:44 PM   Modules accepted: Orders, SmartSet

## 2019-06-18 NOTE — Patient Instructions (Signed)
Medication Instructions:  The current medical regimen is effective;  continue present plan and medications.  *If you need a refill on your cardiac medications before your next appointment, please call your pharmacy*  Lab Work: Please have blood work today. (CBC, BMP) If you have labs (blood work) drawn today and your tests are completely normal, you will receive your results only by: Marland Kitchen MyChart Message (if you have MyChart) OR . A paper copy in the mail If you have any lab test that is abnormal or we need to change your treatment, we will call you to review the results.  Testing/Procedures: Your physician has requested that you have a Cardioversion.  Electrical Cardioversion uses a jolt of electricity to your heart either through paddles or wired patches attached to your chest. This is a controlled, usually prescheduled, procedure. This procedure is done at the hospital and you are not awake during the procedure. You usually go home the day of the procedure. Please see the instruction sheet given to you today for more information.  Follow-Up: At [facility], you and your health needs are our priority.  As part of our continuing mission to provide you with exceptional heart care, we have created designated Provider Care Teams.  These Care Teams include your primary Cardiologist (physician) and Advanced Practice Providers (APPs -  Physician Assistants and Nurse Practitioners) who all work together to provide you with the care you need, when you need it.  Your next appointment:   4 week(s)  The format for your next appointment:   In Person  Provider:   [name], MD  Thank you for choosing [facility]!!

## 2019-06-18 NOTE — H&P (View-Only) (Signed)
Cardiology Office Note:    Date:  06/18/2019   ID:  Judy Walker, DOB [birth year], MRN [id number]  PCP:  Ria Bush, MD  Cardiologist:  [name], MD  Electrophysiologist:  None   Referring MD: Ria Bush, MD     History of Present Illness:    Judy Walker is a 86 y.o. female here for follow-up of atrial fibrillation. At prior visit, we started diltiazem CD 120 once a day.  Stopped her amlodipine at that time.  Heart rate still is in the low 100s.  Not adequately rate controlled. Diltiazem CD 120 mg started to help improve rate control, when she started it she did have some constipation, she is taking a stool softener.  Could be a side effect of this medication.   Her heart rate still is quite fast and I would like to give her an opportunity at sinus rhythm now.  EKG on 03/26/2019 demonstrates A. fib with heart rate in the 80s.  Sits down or dizzy at times. Paces.   No tob  Father was 73's with CAD. All brother with heart issues.   11kids only girl.   Mild SOB when exerting herself. No CP. No syncope.  Sometimes gets some swelling in her feet when she is at the beach.  Past Medical History:  Diagnosis Date  . Benign positional vertigo 2005  . Colonoscopy refused    " I elected not to have one" [place] reviewed 07/06/10; she continues to decline colonscopy  . Heart murmur   . HLD (hyperlipidemia)    total cholesterol 340 in 2003, NHDL 61, TG 75. NMR Lipoprofile 2005: LDL 84 (933/285), LDL glad =<120 ideally <90. Framingham study LDL goal <130  . HTN (hypertension)   . Hypothyroidism   . Osteopenia 08/2012, 09/2015   DEXA -1.9 (2014) -2.3 (2017)    Past Surgical History:  Procedure Laterality Date  . ABDOMINAL HYSTERECTOMY  2003   & BSO for cystic ovaries . No PMH of abnormal PAP  . CARDIAC CATHETERIZATION     normal  . CATARACT EXTRACTION, BILATERAL  09/2011   Dr.[name]   . CYSTOSCOPY     bladder @ age 54 for painful hematuria    Current  Medications: Current Meds  Medication Sig  . apixaban (ELIQUIS) 5 MG TABS tablet Take 1 tablet (5 mg total) by mouth 2 (two) times daily.  Marland Kitchen atorvastatin (LIPITOR) 10 MG tablet TAKE 1 TABLET BY MOUTH EVERY DAY  . calcium gluconate 500 MG tablet Take 500 mg by mouth daily.    . Cholecalciferol (VITAMIN D3) 25 MCG (1000 UT) CAPS Take 1 capsule (1,000 Units total) by mouth daily.  Marland Kitchen diltiazem (CARDIZEM CD) 120 MG 24 hr capsule Take 1 capsule (120 mg total) by mouth daily.  . diphenhydrAMINE (BENADRYL) 25 mg capsule Take 12.5-25 mg by mouth at bedtime as needed for sleep.  . hydrochlorothiazide (HYDRODIURIL) 25 MG tablet TAKE 1 TABLET BY MOUTH EVERY DAY  . levothyroxine (SYNTHROID) 75 MCG tablet TAKE 1 TABLET BY MOUTH EVERY DAY  . losartan (COZAAR) 100 MG tablet TAKE 1 TABLET BY MOUTH EVERY DAY  . Multiple Vitamins-Minerals (MULTIVITAMIN ADULT) TABS Take 1 tablet by mouth daily.  . Potassium 99 MG TABS Take 1 tablet (99 mg total) by mouth daily.     Allergies:   Patient has no known allergies.   Social History   Socioeconomic History  . Marital status: Married    Spouse name: Not on file  . Number  of children: Not on file  . Years of education: Not on file  . Highest education level: Not on file  Occupational History  . Occupation: retired  Tobacco Use  . Smoking status: Never Smoker  . Smokeless tobacco: Never Used  Substance and Sexual Activity  . Alcohol use: Yes    Comment:  RARELY  . Drug use: No  . Sexual activity: Not on file  Other Topics Concern  . Not on file  Social History Narrative   Lives alone. Widow 2012   Daughter lives nearby.   Occupation: retired Clinical cytogeneticist   Was Programmer, multimedia growing up   Activity: Regular exercise - walking 1-2 miles daily, stays active in yard.   Diet: daily fruits/vegetables, some water   Social Determinants of Health   Financial Resource Strain: Low Risk   . Difficulty of Paying Living Expenses: Not hard at all  Food  Insecurity: No Food Insecurity  . Worried About Charity fundraiser in the Last Year: Never true  . Ran Out of Food in the Last Year: Never true  Transportation Needs: No Transportation Needs  . Lack of Transportation (Medical): No  . Lack of Transportation (Non-Medical): No  Physical Activity: Inactive  . Days of Exercise per Week: 0 days  . Minutes of Exercise per Session: 0 min  Stress: No Stress Concern Present  . Feeling of Stress : Not at all  Social Connections:   . Frequency of Communication with Friends and Family: Not on file  . Frequency of Social Gatherings with Friends and Family: Not on file  . Attends Religious Services: Not on file  . Active Member of Clubs or Organizations: Not on file  . Attends Archivist Meetings: Not on file  . Marital Status: Not on file     Family History: The patient's family history includes Alzheimer's disease in her sister; Brain cancer in her brother; Breast cancer in her sister; Cirrhosis in her sister; Diabetes in her father and another family member; Esophageal cancer in her sister; Heart attack in her brother and father; Kidney cancer in her mother; Ovarian cancer in her sister; Stroke (age of onset: 2) in her sister.  ROS:   Please see the history of present illness.     All other systems reviewed and are negative.  EKGs/Labs/Other Studies Reviewed:    The following studies were reviewed today: Echocardiogram:05/15/19   1. Left ventricular ejection fraction, by visual estimation, is 60 to 65%. The left ventricle has normal function. There is mildly increased left ventricular hypertrophy.  2. Left ventricular diastolic parameters are indeterminate.  3. Global right ventricle has normal systolic function.The right ventricular size is normal. No increase in right ventricular wall thickness.  4. Left atrial size was normal.  5. Right atrial size was mild-moderately dilated.  6. Mild mitral annular calcification.  7. The  mitral valve is abnormal. Mild mitral valve regurgitation. No evidence of mitral stenosis.  8. The tricuspid valve is normal in structure. Tricuspid valve regurgitation mild-moderate.  9. The aortic valve is abnormal. Aortic valve regurgitation is trivial. Mild aortic valve stenosis. Moderate reduced leaflet excursion. 10. Aortic valve mean gradient measures 10.3 mmHg. 11. The pulmonic valve was normal in structure. Pulmonic valve regurgitation is trivial. 12. Normal pulmonary artery systolic pressure. 13. The inferior vena cava is normal in size with greater than 50% respiratory variability, suggesting right atrial pressure of 3 mmHg. 14. The left ventricle has no regional wall motion abnormalities.  Prior EKG reviewed demonstrating A. fib  EKG: 03/26/2019-atrial fibrillation 84 bpm  Recent Labs: 03/26/2019: ALT 15; BUN 22; Creatinine, Ser 1.03; Hemoglobin 13.7; Platelets 248.0; Potassium 3.9; Sodium 140; TSH 3.82  Recent Lipid Panel    Component Value Date/Time   CHOL 186 03/26/2019 1203   TRIG 146.0 03/26/2019 1203   TRIG 63 05/17/2006 1034   HDL 56.70 03/26/2019 1203   CHOLHDL 3 03/26/2019 1203   VLDL 29.2 03/26/2019 1203   LDLCALC 100 (H) 03/26/2019 1203   LDLDIRECT 97.0 09/10/2015 1158    Physical Exam:    VS:  BP 140/90   Pulse 100   Ht 5' 6.75" (1.695 m)   Wt 166 lb 6.4 oz (75.5 kg)   SpO2 99%   BMI 26.26 kg/m     Wt Readings from Last 3 Encounters:  06/18/19 166 lb 6.4 oz (75.5 kg)  04/29/19 164 lb 6.4 oz (74.6 kg)  03/26/19 164 lb 4 oz (74.5 kg)     GEN: Well nourished, well developed, in no acute distress  HEENT: normal  Neck: no JVD, carotid bruits, or masses Cardiac: Tachycardic IRRR; 2/6 systolic murmur,no  rubs, or gallops,no edema  Respiratory:  clear to auscultation bilaterally, normal work of breathing GI: soft, nontender, nondistended, + BS MS: no deformity or atrophy  Skin: warm and dry, no rash Neuro:  Alert and Oriented x 3, Strength and  sensation are intact Psych: euthymic mood, full affect   ASSESSMENT:    1. Essential hypertension   2. Atrial fibrillation, new onset ([place])   3. Pre-procedural laboratory examination   4. Medication management    PLAN:    In order of problems listed above:  Persistent atrial fibrillation -I would like to give her an opportunity at sinus rhythm given that she is still mildly tachycardic.  Her EF is normal on echo.  She does not describe any significant symptoms.  We will get her set up for cardioversion.  Risks and benefits have been explained including potential pacemaker needs. -Eliquis has been present for over a month, hemoglobin 13.7 and creatinine 1.0 Continue to monitor blood work every 6 months. -Continue diltiazem CD 120 mg once a day since after cardioversion, her heart rate may be decreased.  If cardioversion is unsuccessful or she reverts back to atrial fibrillation with heart rates around 100, we will increase her diltiazem at that time.   Essential hypertension -Doing fairly well, medications reviewed.  Have her follow-up in 3 to 4 weeks after cardioversion with APP.  Medication Adjustments/Labs and Tests Ordered: Current medicines are reviewed at length with the patient today.  Concerns regarding medicines are outlined above.  Orders Placed This Encounter  Procedures  . CBC  . Basic Metabolic Panel (BMET)   No orders of the defined types were placed in this encounter.   Patient Instructions  Medication Instructions:  The current medical regimen is effective;  continue present plan and medications.  *If you need a refill on your cardiac medications before your next appointment, please call your pharmacy*  Lab Work: Please have blood work today. (CBC, BMP) If you have labs (blood work) drawn today and your tests are completely normal, you will receive your results only by: Marland Kitchen MyChart Message (if you have MyChart) OR . A paper copy in the mail If you have any  lab test that is abnormal or we need to change your treatment, we will call you to review the results.  Testing/Procedures: Your physician has requested that  you have a Cardioversion.  Electrical Cardioversion uses a jolt of electricity to your heart either through paddles or wired patches attached to your chest. This is a controlled, usually prescheduled, procedure. This procedure is done at the hospital and you are not awake during the procedure. You usually go home the day of the procedure. Please see the instruction sheet given to you today for more information.  Follow-Up: At [facility], you and your health needs are our priority.  As part of our continuing mission to provide you with exceptional heart care, we have created designated Provider Care Teams.  These Care Teams include your primary Cardiologist (physician) and Advanced Practice Providers (APPs -  Physician Assistants and Nurse Practitioners) who all work together to provide you with the care you need, when you need it.  Your next appointment:   4 week(s)  The format for your next appointment:   In Person  Provider:   [name], MD  Thank you for choosing [facility]!!        Signed, [name], MD  06/18/2019 12:21 PM    [place]

## 2019-06-24 ENCOUNTER — Other Ambulatory Visit (HOSPITAL_COMMUNITY)
Admission: RE | Admit: 2019-06-24 | Discharge: 2019-06-24 | Disposition: A | Discharge status: Home / Self Care | Payer: Medicare Other | Source: Ambulatory Visit | Attending: Cardiology | Admitting: Cardiology

## 2019-06-24 DIAGNOSIS — Z01812 Encounter for preprocedural laboratory examination: Secondary | ICD-10-CM | POA: Diagnosis not present

## 2019-06-24 DIAGNOSIS — Z20822 Contact with and (suspected) exposure to covid-19: Secondary | ICD-10-CM | POA: Diagnosis not present

## 2019-06-25 LAB — NOVEL CORONAVIRUS, NAA (HOSP ORDER, SEND-OUT TO REF LAB; TAT 18-24 HRS): SARS-CoV-2, NAA: NOT DETECTED

## 2019-06-27 ENCOUNTER — Ambulatory Visit (HOSPITAL_COMMUNITY)
Admission: RE | Admit: 2019-06-27 | Discharge: 2019-06-27 | Disposition: A | Discharge status: Home / Self Care | Payer: Medicare Other | Attending: Cardiology | Admitting: Cardiology

## 2019-06-27 ENCOUNTER — Encounter (HOSPITAL_COMMUNITY): Payer: Self-pay | Admitting: Cardiology

## 2019-06-27 ENCOUNTER — Ambulatory Visit (HOSPITAL_COMMUNITY): Payer: Medicare Other | Admitting: Certified Registered Nurse Anesthetist

## 2019-06-27 ENCOUNTER — Encounter (HOSPITAL_COMMUNITY)
Admission: RE | Disposition: A | Discharge status: Home / Self Care | Payer: Medicare Other | Source: Home / Self Care | Attending: Cardiology

## 2019-06-27 ENCOUNTER — Other Ambulatory Visit: Payer: Self-pay

## 2019-06-27 DIAGNOSIS — M8580 Other specified disorders of bone density and structure, unspecified site: Secondary | ICD-10-CM | POA: Diagnosis not present

## 2019-06-27 DIAGNOSIS — Z79899 Other long term (current) drug therapy: Secondary | ICD-10-CM | POA: Diagnosis not present

## 2019-06-27 DIAGNOSIS — I4819 Other persistent atrial fibrillation: Secondary | ICD-10-CM | POA: Insufficient documentation

## 2019-06-27 DIAGNOSIS — I4891 Unspecified atrial fibrillation: Secondary | ICD-10-CM | POA: Diagnosis not present

## 2019-06-27 DIAGNOSIS — Z79890 Hormone replacement therapy (postmenopausal): Secondary | ICD-10-CM | POA: Diagnosis not present

## 2019-06-27 DIAGNOSIS — I480 Paroxysmal atrial fibrillation: Secondary | ICD-10-CM

## 2019-06-27 DIAGNOSIS — E039 Hypothyroidism, unspecified: Secondary | ICD-10-CM | POA: Insufficient documentation

## 2019-06-27 DIAGNOSIS — I10 Essential (primary) hypertension: Secondary | ICD-10-CM | POA: Insufficient documentation

## 2019-06-27 DIAGNOSIS — Z7901 Long term (current) use of anticoagulants: Secondary | ICD-10-CM | POA: Insufficient documentation

## 2019-06-27 DIAGNOSIS — K5900 Constipation, unspecified: Secondary | ICD-10-CM | POA: Insufficient documentation

## 2019-06-27 DIAGNOSIS — E785 Hyperlipidemia, unspecified: Secondary | ICD-10-CM | POA: Diagnosis not present

## 2019-06-27 HISTORY — PX: CARDIOVERSION: SHX1299

## 2019-06-27 LAB — POCT I-STAT, CHEM 8
BUN: 25 mg/dL — ABNORMAL HIGH (ref 8–23)
Calcium, Ion: 1.13 mmol/L — ABNORMAL LOW (ref 1.15–1.40)
Chloride: 101 mmol/L (ref 98–111)
Creatinine, Ser: 1.1 mg/dL — ABNORMAL HIGH (ref 0.44–1.00)
Glucose, Bld: 108 mg/dL — ABNORMAL HIGH (ref 70–99)
HCT: 41 % (ref 36.0–46.0)
Hemoglobin: 13.9 g/dL (ref 12.0–15.0)
Potassium: 3.5 mmol/L (ref 3.5–5.1)
Sodium: 141 mmol/L (ref 135–145)
TCO2: 30 mmol/L (ref 22–32)

## 2019-06-27 SURGERY — CARDIOVERSION
Anesthesia: General

## 2019-06-27 MED ORDER — SODIUM CHLORIDE 0.9 % IV SOLN: INTRAVENOUS | Status: DC | PRN

## 2019-06-27 MED ORDER — PROPOFOL 10 MG/ML IV BOLUS
INTRAVENOUS | Status: DC | PRN
  Administered 2019-06-27: 50 mg via INTRAVENOUS

## 2019-06-27 MED ORDER — LIDOCAINE 2% (20 MG/ML) 5 ML SYRINGE
INTRAMUSCULAR | Status: DC | PRN
  Administered 2019-06-27: 50 mg via INTRAVENOUS

## 2019-06-27 NOTE — Anesthesia Procedure Notes (Signed)
Procedure Name: General with mask airway Date/Time: 06/27/2019 9:59 AM Performed by: Harden Mo, CRNA Pre-anesthesia Checklist: Patient identified, Emergency Drugs available, Suction available and Patient being monitored Patient Re-evaluated:Patient Re-evaluated prior to induction Oxygen Delivery Method: Ambu bag Preoxygenation: Pre-oxygenation with 100% oxygen Induction Type: IV induction Placement Confirmation: positive ETCO2 and breath sounds checked- equal and bilateral Dental Injury: Teeth and Oropharynx as per pre-operative assessment

## 2019-06-27 NOTE — Anesthesia Preprocedure Evaluation (Addendum)
Anesthesia Evaluation  Patient identified by MRN, date of birth, ID band Patient awake    Reviewed: Allergy & Precautions, NPO status , Patient's Chart, lab work & pertinent test results  Airway Mallampati: II  TM Distance: >3 FB Neck ROM: Full    Dental no notable dental hx. (+) Teeth Intact, Dental Advisory Given   Pulmonary neg pulmonary ROS,    Pulmonary exam normal breath sounds clear to auscultation       Cardiovascular hypertension, Pt. on medications + dysrhythmias Atrial Fibrillation  Rhythm:Irregular Rate:Abnormal  05/15/19 Echo  1. Left ventricular ejection fraction, by visual estimation, is 60 to 65%. The left ventricle has normal function. There is mildly increased left ventricular hypertrophy   Neuro/Psych negative neurological ROS  negative psych ROS   GI/Hepatic negative GI ROS, Neg liver ROS,   Endo/Other  Hypothyroidism   Renal/GU negative Renal ROS     Musculoskeletal negative musculoskeletal ROS (+)   Abdominal   Peds  Hematology negative hematology ROS (+)   Anesthesia Other Findings   Reproductive/Obstetrics                           Anesthesia Physical Anesthesia Plan  ASA: III  Anesthesia Plan: General   Post-op Pain Management:    Induction: Intravenous  PONV Risk Score and Plan: Treatment may vary due to age or medical condition  Airway Management Planned: Nasal Cannula and Natural Airway  Additional Equipment: None  Intra-op Plan:   Post-operative Plan:   Informed Consent: I have reviewed the patients History and Physical, chart, labs and discussed the procedure including the risks, benefits and alternatives for the proposed anesthesia with the patient or authorized representative who has indicated his/her understanding and acceptance.     Dental advisory given  Plan Discussed with: CRNA  Anesthesia Plan Comments:         Anesthesia Quick  Evaluation

## 2019-06-27 NOTE — Anesthesia Postprocedure Evaluation (Signed)
Anesthesia Post Note  Patient: Judy Walker  Procedure(s) Performed: CARDIOVERSION (N/A )     Patient location during evaluation: Endoscopy Anesthesia Type: General Level of consciousness: awake and alert Pain management: pain level controlled Vital Signs Assessment: post-procedure vital signs reviewed and stable Respiratory status: spontaneous breathing, nonlabored ventilation, respiratory function stable and patient connected to nasal cannula oxygen Cardiovascular status: blood pressure returned to baseline and stable Postop Assessment: no apparent nausea or vomiting Anesthetic complications: no    Last Vitals:  Vitals:   06/27/19 1015 06/27/19 1025  BP:  122/72  Pulse: 79   Resp: 18   Temp:    SpO2: 98%     Last Pain:  Vitals:   06/27/19 1011  TempSrc:   PainSc: 0-No pain                 [place]et [place]

## 2019-06-27 NOTE — Interval H&P Note (Signed)
History and Physical Interval Note:  06/27/2019 8:48 AM  Judy Walker  has presented today for surgery, with the diagnosis of A-FIB.  The various methods of treatment have been discussed with the patient and family. After consideration of risks, benefits and other options for treatment, the patient has consented to  Procedure(s): CARDIOVERSION (N/A) as a surgical intervention.  The patient's history has been reviewed, patient examined, no change in status, stable for surgery.  I have reviewed the patient's chart and labs.  Questions were answered to the patient's satisfaction.     [employer]

## 2019-06-27 NOTE — Transfer of Care (Signed)
Immediate Anesthesia Transfer of Care Note  Patient: Judy Walker  Procedure(s) Performed: CARDIOVERSION (N/A )  Patient Location: Endoscopy Unit  Anesthesia Type:General  Level of Consciousness: awake, alert  and oriented  Airway & Oxygen Therapy: Patient Spontanous Breathing  Post-op Assessment: Report given to RN, Post -op Vital signs reviewed and stable and Patient moving all extremities X 4  Post vital signs: Reviewed and stable  Last Vitals:  Vitals Value Taken Time  BP    Temp    Pulse    Resp    SpO2      Last Pain:  Vitals:   06/27/19 0836  TempSrc: Oral  PainSc: 0-No pain         Complications: No apparent anesthesia complications

## 2019-06-27 NOTE — Discharge Instructions (Signed)
Monitored Anesthesia Care Anesthesia is a term that refers to techniques, procedures, and medicines that help a person stay safe and comfortable during a medical procedure. Monitored anesthesia care, or sedation, is one type of anesthesia. Your anesthesia specialist may recommend sedation if you will be having a procedure that does not require you to be unconscious, such as:  Cataract surgery.  A dental procedure.  A biopsy.  A colonoscopy. During the procedure, you may receive a medicine to help you relax (sedative). There are three levels of sedation:  Mild sedation. At this level, you may feel awake and relaxed. You will be able to follow directions.  Moderate sedation. At this level, you will be sleepy. You may not remember the procedure.  Deep sedation. At this level, you will be asleep. You will not remember the procedure. The more medicine you are given, the deeper your level of sedation will be. Depending on how you respond to the procedure, the anesthesia specialist may change your level of sedation or the type of anesthesia to fit your needs. An anesthesia specialist will monitor you closely during the procedure. Let your health care provider know about:  Any allergies you have.  All medicines you are taking, including vitamins, herbs, eye drops, creams, and over-the-counter medicines.  Any use of steroids (by mouth or as a cream).  Any problems you or family members have had with sedatives and anesthetic medicines.  Any blood disorders you have.  Any surgeries you have had.  Any medical conditions you have, such as sleep apnea.  Whether you are pregnant or may be pregnant.  Any use of cigarettes, alcohol, or street drugs. What are the risks? Generally, this is a safe procedure. However, problems may occur, including:  Getting too much medicine (oversedation).  Nausea.  Allergic reaction to medicines.  Trouble breathing. If this happens, a breathing tube may be  used to help with breathing. It will be removed when you are awake and breathing on your own.  Heart trouble.  Lung trouble. Before the procedure Staying hydrated Follow instructions from your health care provider about hydration, which may include:  Up to 2 hours before the procedure - you may continue to drink clear liquids, such as water, clear fruit juice, black coffee, and plain tea. Eating and drinking restrictions Follow instructions from your health care provider about eating and drinking, which may include:  8 hours before the procedure - stop eating heavy meals or foods such as meat, fried foods, or fatty foods.  6 hours before the procedure - stop eating light meals or foods, such as toast or cereal.  6 hours before the procedure - stop drinking milk or drinks that contain milk.  2 hours before the procedure - stop drinking clear liquids. Medicines Ask your health care provider about:  Changing or stopping your regular medicines. This is especially important if you are taking diabetes medicines or blood thinners.  Taking medicines such as aspirin and ibuprofen. These medicines can thin your blood. Do not take these medicines before your procedure if your health care provider instructs you not to. Tests and exams  You will have a physical exam.  You may have blood tests done to show: ? How well your kidneys and liver are working. ? How well your blood can clot. General instructions  Plan to have someone take you home from the hospital or clinic.  If you will be going home right after the procedure, plan to have someone with you  for 24 hours.  What happens during the procedure?  Your blood pressure, heart rate, breathing, level of pain and overall condition will be monitored.  An IV tube will be inserted into one of your veins.  Your anesthesia specialist will give you medicines as needed to keep you comfortable during the procedure. This may mean changing the  level of sedation.  The procedure will be performed. After the procedure  Your blood pressure, heart rate, breathing rate, and blood oxygen level will be monitored until the medicines you were given have worn off.  Do not drive for 24 hours if you received a sedative.  You may: ? Feel sleepy, clumsy, or nauseous. ? Feel forgetful about what happened after the procedure. ? Have a sore throat if you had a breathing tube during the procedure. ? Vomit. This information is not intended to replace advice given to you by your health care provider. Make sure you discuss any questions you have with your health care provider. Document Revised: 05/05/2017 Document Reviewed: 09/13/2015 Elsevier Patient Education  2020 Elsevier Inc. Electrical Cardioversion Electrical cardioversion is the delivery of a jolt of electricity to restore a normal rhythm to the heart. A rhythm that is too fast or is not regular keeps the heart from pumping well. In this procedure, sticky patches or metal paddles are placed on the chest to deliver electricity to the heart from a device. This procedure may be done in an emergency if:  There is low or no blood pressure as a result of the heart rhythm.  Normal rhythm must be restored as fast as possible to protect the brain and heart from further damage.  It may save a life. This may also be a scheduled procedure for irregular or fast heart rhythms that are not immediately life-threatening. Tell a health care provider about:  Any allergies you have.  All medicines you are taking, including vitamins, herbs, eye drops, creams, and over-the-counter medicines.  Any problems you or family members have had with anesthetic medicines.  Any blood disorders you have.  Any surgeries you have had.  Any medical conditions you have.  Whether you are pregnant or may be pregnant. What are the risks? Generally, this is a safe procedure. However, problems may occur,  including:  Allergic reactions to medicines.  A blood clot that breaks free and travels to other parts of your body.  The possible return of an abnormal heart rhythm within hours or days after the procedure.  Your heart stopping (cardiac arrest). This is rare. What happens before the procedure? Medicines  Your health care provider may have you start taking: ? Blood-thinning medicines (anticoagulants) so your blood does not clot as easily. ? Medicines to help stabilize your heart rate and rhythm.  Ask your health care provider about: ? Changing or stopping your regular medicines. This is especially important if you are taking diabetes medicines or blood thinners. ? Taking medicines such as aspirin and ibuprofen. These medicines can thin your blood. Do not take these medicines unless your health care provider tells you to take them. ? Taking over-the-counter medicines, vitamins, herbs, and supplements. General instructions  Follow instructions from your health care provider about eating or drinking restrictions.  Plan to have someone take you home from the hospital or clinic.  If you will be going home right after the procedure, plan to have someone with you for 24 hours.  Ask your health care provider what steps will be taken to help prevent infection.   These may include washing your skin with a germ-killing soap. What happens during the procedure?   An IV will be inserted into one of your veins.  Sticky patches (electrodes) or metal paddles may be placed on your chest.  You will be given a medicine to help you relax (sedative).  An electrical shock will be delivered. The procedure may vary among health care providers and hospitals. What can I expect after the procedure?  Your blood pressure, heart rate, breathing rate, and blood oxygen level will be monitored until you leave the hospital or clinic.  Your heart rhythm will be watched to make sure it does not change.  You  may have some redness on the skin where the shocks were given. Follow these instructions at home:  Do not drive for 24 hours if you were given a sedative during your procedure.  Take over-the-counter and prescription medicines only as told by your health care provider.  Ask your health care provider how to check your pulse. Check it often.  Rest for 48 hours after the procedure or as told by your health care provider.  Avoid or limit your caffeine use as told by your health care provider.  Keep all follow-up visits as told by your health care provider. This is important. Contact a health care provider if:  You feel like your heart is beating too quickly or your pulse is not regular.  You have a serious muscle cramp that does not go away. Get help right away if:  You have discomfort in your chest.  You are dizzy or you feel faint.  You have trouble breathing or you are short of breath.  Your speech is slurred.  You have trouble moving an arm or leg on one side of your body.  Your fingers or toes turn cold or blue. Summary  Electrical cardioversion is the delivery of a jolt of electricity to restore a normal rhythm to the heart.  This procedure may be done right away in an emergency or may be a scheduled procedure if the condition is not an emergency.  Generally, this is a safe procedure.  After the procedure, check your pulse often as told by your health care provider. This information is not intended to replace advice given to you by your health care provider. Make sure you discuss any questions you have with your health care provider. Document Revised: 12/24/2018 Document Reviewed: 12/24/2018 Elsevier Patient Education  2020 Elsevier Inc.  

## 2019-06-27 NOTE — CV Procedure (Signed)
    Electrical Cardioversion Procedure Note Judy Walker [id number] [birth year]  Procedure: Electrical Cardioversion Indications:  Atrial Fibrillation  Time Out: Verified patient identification, verified procedure,medications/allergies/relevent history reviewed, required imaging and test results available.  Performed  Procedure Details  The patient was NPO after midnight. Anesthesia was administered at the beside  by Dr. Valma Cava with 50mg  of propofol.  Cardioversion was performed with synchronized biphasic defibrillation via AP pads with 120 joules.  1 attempt(s) were performed.  The patient converted to normal sinus rhythm. The patient tolerated the procedure well   IMPRESSION:  Successful cardioversion of atrial fibrillation    Judy Walker 06/27/2019, 10:08 AM

## 2019-07-02 ENCOUNTER — Ambulatory Visit: Payer: Medicare Other

## 2019-07-11 ENCOUNTER — Ambulatory Visit: Payer: Medicare Other | Attending: Internal Medicine

## 2019-07-11 DIAGNOSIS — Z23 Encounter for immunization: Secondary | ICD-10-CM | POA: Insufficient documentation

## 2019-07-11 NOTE — Progress Notes (Signed)
   Covid-19 Vaccination Clinic  Name:  PIEDAD HANSMAN    MRN: [id number] DOB: 1932-11-18  07/11/2019  Ms. Kiesel was observed post Covid-19 immunization for 15 minutes without incidence. She was provided with Vaccine Information Sheet and instruction to access the V-Safe system.   Ms. Misquez was instructed to call 911 with any severe reactions post vaccine: Marland Kitchen Difficulty breathing  . Swelling of your face and throat  . A fast heartbeat  . A bad rash all over your body  . Dizziness and weakness    Immunizations Administered    Name Date Dose VIS Date Route   Pfizer COVID-19 Vaccine 07/11/2019 10:33 AM 0.3 mL 05/17/2019 Intramuscular   Manufacturer: [place]   Lot: [id number]   [place]: [id number]

## 2019-07-25 ENCOUNTER — Ambulatory Visit: Payer: Medicare Other | Admitting: Cardiology

## 2019-08-05 ENCOUNTER — Ambulatory Visit: Payer: Medicare Other | Attending: Internal Medicine

## 2019-08-05 DIAGNOSIS — Z23 Encounter for immunization: Secondary | ICD-10-CM | POA: Insufficient documentation

## 2019-08-05 NOTE — Progress Notes (Signed)
   Covid-19 Vaccination Clinic  Name:  Judy Walker    MRN: [id number] DOB: 03-13-1933  08/05/2019  Ms. [name] was observed post Covid-19 immunization for 15 minutes without incidence. She was provided with Vaccine Information Sheet and instruction to access the V-Safe system.   Ms. [name] was instructed to call 911 with any severe reactions post vaccine: Marland Kitchen Difficulty breathing  . Swelling of your face and throat  . A fast heartbeat  . A bad rash all over your body  . Dizziness and weakness    Immunizations Administered    Name Date Dose VIS Date Route   Pfizer COVID-19 Vaccine 08/05/2019 11:05 AM 0.3 mL 05/17/2019 Intramuscular   Manufacturer: [place]   Lot: [id number]   [place]: [id number]

## 2019-08-22 ENCOUNTER — Encounter: Payer: Self-pay | Admitting: Cardiology

## 2019-08-22 ENCOUNTER — Ambulatory Visit: Payer: Medicare Other | Admitting: Cardiology

## 2019-08-22 ENCOUNTER — Other Ambulatory Visit: Payer: Self-pay

## 2019-08-22 VITALS — BP 120/80 | HR 78 | Ht 66.75 in | Wt 170.0 lb

## 2019-08-22 DIAGNOSIS — I10 Essential (primary) hypertension: Secondary | ICD-10-CM

## 2019-08-22 DIAGNOSIS — I350 Nonrheumatic aortic (valve) stenosis: Secondary | ICD-10-CM

## 2019-08-22 DIAGNOSIS — I480 Paroxysmal atrial fibrillation: Secondary | ICD-10-CM | POA: Diagnosis not present

## 2019-08-22 NOTE — Progress Notes (Signed)
Cardiology Office Note:    Date:  08/22/2019   ID:  Judy Walker, DOB December 20, 1932, MRN [id number]  PCP:  Ria Bush, MD  Cardiologist:  [name], MD  Electrophysiologist:  None   Referring MD: Ria Bush, MD     History of Present Illness:    Judy Walker is a 86 y.o. female post cardioversion, atrial fibrillation follow-up.  1 attempt with 120 J, tolerated well back on 06/27/2019.  She feels better.  Feels like she has more energy.  No chest pain no shortness of breath.  She was given diltiazem CD 120 mg a day.  When she started it she did have constipation taking stool softener.  Prior EKG on 03/26/2019 showed atrial fibrillation with heart rate in the 80s.  At times can feel subtle dizziness and needed to sit down prior to cardioversion.  Father had coronary artery disease diagnosed in the 74s.  All brothers have heart issues.  She is 1 of 11 children, she is the only girl.  Her husband died several years ago, had depression for the last 73 years of his life she states.  She maintains a very positive attitude.    Past Medical History:  Diagnosis Date  . Benign positional vertigo 2005  . Colonoscopy refused    " I elected not to have one" [place] reviewed 07/06/10; she continues to decline colonscopy  . Heart murmur   . HLD (hyperlipidemia)    total cholesterol 340 in 2003, NHDL 61, TG 75. NMR Lipoprofile 2005: LDL 84 (933/285), LDL glad =<120 ideally <90. Framingham study LDL goal <130  . HTN (hypertension)   . Hypothyroidism   . Osteopenia 08/2012, 09/2015   DEXA -1.9 (2014) -2.3 (2017)    Past Surgical History:  Procedure Laterality Date  . ABDOMINAL HYSTERECTOMY  2003   & BSO for cystic ovaries . No PMH of abnormal PAP  . CARDIAC CATHETERIZATION     normal  . CARDIOVERSION N/A 06/27/2019   Procedure: CARDIOVERSION;  Surgeon: Jerline Pain, MD;  Location: [facility];  Service: Cardiovascular;  Laterality: N/A;  . CATARACT EXTRACTION,  BILATERAL  09/2011   Dr.[name]   . CYSTOSCOPY     bladder @ age 31 for painful hematuria    Current Medications: Current Meds  Medication Sig  . apixaban (ELIQUIS) 5 MG TABS tablet Take 1 tablet (5 mg total) by mouth 2 (two) times daily.  . Ascorbic Acid (VITAMIN C GUMMIE PO) Take 250 mg by mouth 4 (four) times a week.  Marland Kitchen atorvastatin (LIPITOR) 10 MG tablet TAKE 1 TABLET BY MOUTH EVERY DAY  . Calcium Carbonate (CALCIUM 500 PO) Take 500 mg by mouth daily.  . Cholecalciferol (VITAMIN D3) 25 MCG (1000 UT) CAPS Take 1 capsule (1,000 Units total) by mouth daily.  Marland Kitchen diltiazem (CARDIZEM CD) 120 MG 24 hr capsule Take 1 capsule (120 mg total) by mouth daily.  . diphenhydrAMINE (BENADRYL) 25 mg capsule Take 25 mg by mouth at bedtime.   . hydrochlorothiazide (HYDRODIURIL) 25 MG tablet TAKE 1 TABLET BY MOUTH EVERY DAY  . levothyroxine (SYNTHROID) 75 MCG tablet TAKE 1 TABLET BY MOUTH EVERY DAY  . losartan (COZAAR) 100 MG tablet TAKE 1 TABLET BY MOUTH EVERY DAY  . Multiple Vitamins-Minerals (MULTIVITAMIN ADULT) TABS Take 1 tablet by mouth daily.  . potassium gluconate 595 (99 K) MG TABS tablet Take 595 mg by mouth daily.     Allergies:   Patient has no known allergies.   Social  History   Socioeconomic History  . Marital status: Married    Spouse name: Not on file  . Number of children: Not on file  . Years of education: Not on file  . Highest education level: Not on file  Occupational History  . Occupation: retired  Tobacco Use  . Smoking status: Never Smoker  . Smokeless tobacco: Never Used  Substance and Sexual Activity  . Alcohol use: Yes    Comment:  RARELY  . Drug use: No  . Sexual activity: Not on file  Other Topics Concern  . Not on file  Social History Narrative   Lives alone. Widow 2012   Daughter lives nearby.   Occupation: retired Clinical cytogeneticist   Was Programmer, multimedia growing up   Activity: Regular exercise - walking 1-2 miles daily, stays active in yard.    Diet: daily fruits/vegetables, some water   Social Determinants of Health   Financial Resource Strain: Low Risk   . Difficulty of Paying Living Expenses: Not hard at all  Food Insecurity: No Food Insecurity  . Worried About Charity fundraiser in the Last Year: Never true  . Ran Out of Food in the Last Year: Never true  Transportation Needs: No Transportation Needs  . Lack of Transportation (Medical): No  . Lack of Transportation (Non-Medical): No  Physical Activity: Inactive  . Days of Exercise per Week: 0 days  . Minutes of Exercise per Session: 0 min  Stress: No Stress Concern Present  . Feeling of Stress : Not at all  Social Connections:   . Frequency of Communication with Friends and Family:   . Frequency of Social Gatherings with Friends and Family:   . Attends Religious Services:   . Active Member of Clubs or Organizations:   . Attends Archivist Meetings:   Marland Kitchen Marital Status:      Family History: The patient's family history includes Alzheimer's disease in her sister; Brain cancer in her brother; Breast cancer in her sister; Cirrhosis in her sister; Diabetes in her father and another family member; Esophageal cancer in her sister; Heart attack in her brother and father; Kidney cancer in her mother; Ovarian cancer in her sister; Stroke (age of onset: 32) in her sister.  ROS:   Please see the history of present illness.    No fevers chills nausea vomiting syncope all other systems reviewed and are negative.  EKGs/Labs/Other Studies Reviewed:    The following studies were reviewed today: Prior cardioversion note hospital note EKG  EKG:  EKG is  ordered today.  The ekg ordered today demonstrates sinus rhythm 78 no other abnormalities.  Recent Labs: 03/26/2019: ALT 15; TSH 3.82 06/18/2019: Platelets 265 06/27/2019: BUN 25; Creatinine, Ser 1.10; Hemoglobin 13.9; Potassium 3.5; Sodium 141  Recent Lipid Panel    Component Value Date/Time   CHOL 186 03/26/2019  1203   TRIG 146.0 03/26/2019 1203   TRIG 63 05/17/2006 1034   HDL 56.70 03/26/2019 1203   CHOLHDL 3 03/26/2019 1203   VLDL 29.2 03/26/2019 1203   LDLCALC 100 (H) 03/26/2019 1203   LDLDIRECT 97.0 09/10/2015 1158    Physical Exam:    VS:  BP 120/80   Pulse 78   Ht 5' 6.75" (1.695 m)   Wt 170 lb (77.1 kg)   SpO2 98%   BMI 26.83 kg/m     Wt Readings from Last 3 Encounters:  08/22/19 170 lb (77.1 kg)  06/18/19 166 lb 6.4 oz (75.5 kg)  04/29/19 164 lb 6.4 oz (74.6 kg)     GEN:  Well nourished, well developed in no acute distress HEENT: Normal NECK: No JVD; No carotid bruits LYMPHATICS: No lymphadenopathy CARDIAC: RRR, 2/6 systolic murmur, rubs, gallops RESPIRATORY:  Clear to auscultation without rales, wheezing or rhonchi  ABDOMEN: Soft, non-tender, non-distended MUSCULOSKELETAL:  No edema; No deformity  SKIN: Warm and dry NEUROLOGIC:  Alert and oriented x 3 PSYCHIATRIC:  Normal affect   ASSESSMENT:    1. Essential hypertension   2. Paroxysmal atrial fibrillation (HCC)   3. Mild aortic stenosis    PLAN:    In order of problems listed above:  Paroxysmal atrial fibrillation -Successful cardioversion in January 2021.  She is maintaining sinus rhythm.  Continue with anticoagulation, Eliquis.  Continue with diltiazem 120 once a day.  Taking stool softener.  She feels better in sinus rhythm she states, more energy.  She is looking forward to going down to her beach place at [facility] for the summers.  She has coffee with her neighbors on the front porch every morning. -Echo EF 65% mild aortic valve stenosis.  Mild aortic valve stenosis -We will keep an eye on this.  Murmur heard on exam.  Hyperlipidemia -On Lipitor 10 mg once a day.  Doing well.  No myalgias.  Hypertension -Losartan hydrochlorothiazide.  Stable.  No changes made medications.   Medication Adjustments/Labs and Tests Ordered: Current medicines are reviewed at length with the patient today.   Concerns regarding medicines are outlined above.  Orders Placed This Encounter  Procedures  . EKG 12-Lead   No orders of the defined types were placed in this encounter.   Patient Instructions  Medication Instructions:  The current medical regimen is effective;  continue present plan and medications.  *If you need a refill on your cardiac medications before your next appointment, please call your pharmacy*  Follow-Up: At [facility], you and your health needs are our priority.  As part of our continuing mission to provide you with exceptional heart care, we have created designated Provider Care Teams.  These Care Teams include your primary Cardiologist (physician) and Advanced Practice Providers (APPs -  Physician Assistants and Nurse Practitioners) who all work together to provide you with the care you need, when you need it.  We recommend signing up for the patient portal called "MyChart".  Sign up information is provided on this After Visit Summary.  MyChart is used to connect with patients for Virtual Visits (Telemedicine).  Patients are able to view lab/test results, encounter notes, upcoming appointments, etc.  Non-urgent messages can be sent to your provider as well.   To learn more about what you can do with MyChart, go to [email or web address].    Your next appointment:   6 month(s)  The format for your next appointment:   In Person  Provider:   See Cecilie Kicks, NP in 6 months and Dr Marlou Porch in 1 year. You may see [name], MD or one of the following Advanced Practice Providers on your designated Care Team:    Truitt Merle, NP  Cecilie Kicks, NP  Kathyrn Drown, NP  Thank you for choosing [facility]!!        Signed, [name], MD  08/22/2019 11:35 AM    [place]

## 2019-08-22 NOTE — Patient Instructions (Signed)
Medication Instructions:  The current medical regimen is effective;  continue present plan and medications.  *If you need a refill on your cardiac medications before your next appointment, please call your pharmacy*  Follow-Up: At [facility], you and your health needs are our priority.  As part of our continuing mission to provide you with exceptional heart care, we have created designated Provider Care Teams.  These Care Teams include your primary Cardiologist (physician) and Advanced Practice Providers (APPs -  Physician Assistants and Nurse Practitioners) who all work together to provide you with the care you need, when you need it.  We recommend signing up for the patient portal called "MyChart".  Sign up information is provided on this After Visit Summary.  MyChart is used to connect with patients for Virtual Visits (Telemedicine).  Patients are able to view lab/test results, encounter notes, upcoming appointments, etc.  Non-urgent messages can be sent to your provider as well.   To learn more about what you can do with MyChart, go to [email or web address].    Your next appointment:   6 month(s)  The format for your next appointment:   In Person  Provider:   See Cecilie Kicks, NP in 6 months and Dr Marlou Porch in 1 year. You may see [name], MD or one of the following Advanced Practice Providers on your designated Care Team:    Truitt Merle, NP  Cecilie Kicks, NP  Kathyrn Drown, NP  Thank you for choosing [facility]!!

## 2019-11-05 HISTORY — PX: ESOPHAGOGASTRODUODENOSCOPY: SHX1529

## 2019-11-08 DIAGNOSIS — K319 Disease of stomach and duodenum, unspecified: Secondary | ICD-10-CM | POA: Diagnosis not present

## 2019-11-08 DIAGNOSIS — D649 Anemia, unspecified: Secondary | ICD-10-CM | POA: Diagnosis not present

## 2019-11-08 DIAGNOSIS — I10 Essential (primary) hypertension: Secondary | ICD-10-CM | POA: Diagnosis not present

## 2019-11-08 DIAGNOSIS — R42 Dizziness and giddiness: Secondary | ICD-10-CM | POA: Diagnosis not present

## 2019-11-08 DIAGNOSIS — R112 Nausea with vomiting, unspecified: Secondary | ICD-10-CM | POA: Diagnosis not present

## 2019-11-08 DIAGNOSIS — I6781 Acute cerebrovascular insufficiency: Secondary | ICD-10-CM | POA: Diagnosis not present

## 2019-11-08 DIAGNOSIS — I482 Chronic atrial fibrillation, unspecified: Secondary | ICD-10-CM | POA: Diagnosis not present

## 2019-11-08 DIAGNOSIS — R110 Nausea: Secondary | ICD-10-CM | POA: Diagnosis not present

## 2019-11-08 DIAGNOSIS — I4891 Unspecified atrial fibrillation: Secondary | ICD-10-CM | POA: Diagnosis not present

## 2019-11-08 DIAGNOSIS — Z7901 Long term (current) use of anticoagulants: Secondary | ICD-10-CM | POA: Diagnosis not present

## 2019-11-08 DIAGNOSIS — E039 Hypothyroidism, unspecified: Secondary | ICD-10-CM | POA: Diagnosis not present

## 2019-11-08 DIAGNOSIS — E785 Hyperlipidemia, unspecified: Secondary | ICD-10-CM | POA: Diagnosis not present

## 2019-11-08 DIAGNOSIS — R519 Headache, unspecified: Secondary | ICD-10-CM | POA: Diagnosis not present

## 2019-11-08 DIAGNOSIS — R1110 Vomiting, unspecified: Secondary | ICD-10-CM | POA: Diagnosis not present

## 2019-11-08 DIAGNOSIS — I480 Paroxysmal atrial fibrillation: Secondary | ICD-10-CM | POA: Diagnosis not present

## 2019-11-09 ENCOUNTER — Encounter: Payer: Self-pay | Admitting: Family Medicine

## 2019-11-09 DIAGNOSIS — I6523 Occlusion and stenosis of bilateral carotid arteries: Secondary | ICD-10-CM | POA: Diagnosis not present

## 2019-11-09 DIAGNOSIS — G459 Transient cerebral ischemic attack, unspecified: Secondary | ICD-10-CM | POA: Diagnosis not present

## 2019-11-09 DIAGNOSIS — R42 Dizziness and giddiness: Secondary | ICD-10-CM | POA: Diagnosis not present

## 2019-11-09 DIAGNOSIS — D649 Anemia, unspecified: Secondary | ICD-10-CM | POA: Diagnosis not present

## 2019-11-09 DIAGNOSIS — R1110 Vomiting, unspecified: Secondary | ICD-10-CM | POA: Diagnosis not present

## 2019-11-09 DIAGNOSIS — I6781 Acute cerebrovascular insufficiency: Secondary | ICD-10-CM | POA: Diagnosis not present

## 2019-11-09 DIAGNOSIS — K319 Disease of stomach and duodenum, unspecified: Secondary | ICD-10-CM | POA: Diagnosis not present

## 2019-11-10 DIAGNOSIS — K259 Gastric ulcer, unspecified as acute or chronic, without hemorrhage or perforation: Secondary | ICD-10-CM | POA: Diagnosis not present

## 2019-11-10 DIAGNOSIS — Z7901 Long term (current) use of anticoagulants: Secondary | ICD-10-CM | POA: Diagnosis not present

## 2019-11-10 DIAGNOSIS — K3189 Other diseases of stomach and duodenum: Secondary | ICD-10-CM | POA: Diagnosis not present

## 2019-11-10 DIAGNOSIS — K319 Disease of stomach and duodenum, unspecified: Secondary | ICD-10-CM | POA: Diagnosis not present

## 2019-11-10 DIAGNOSIS — K922 Gastrointestinal hemorrhage, unspecified: Secondary | ICD-10-CM | POA: Diagnosis not present

## 2019-11-10 DIAGNOSIS — D649 Anemia, unspecified: Secondary | ICD-10-CM | POA: Diagnosis not present

## 2019-11-11 ENCOUNTER — Encounter: Payer: Self-pay | Admitting: Family Medicine

## 2019-11-11 DIAGNOSIS — R112 Nausea with vomiting, unspecified: Secondary | ICD-10-CM | POA: Diagnosis not present

## 2019-11-11 DIAGNOSIS — R011 Cardiac murmur, unspecified: Secondary | ICD-10-CM | POA: Diagnosis not present

## 2019-11-11 DIAGNOSIS — I10 Essential (primary) hypertension: Secondary | ICD-10-CM | POA: Diagnosis not present

## 2019-11-11 DIAGNOSIS — I350 Nonrheumatic aortic (valve) stenosis: Secondary | ICD-10-CM | POA: Diagnosis not present

## 2019-11-11 DIAGNOSIS — K254 Chronic or unspecified gastric ulcer with hemorrhage: Secondary | ICD-10-CM | POA: Diagnosis not present

## 2019-11-11 DIAGNOSIS — E860 Dehydration: Secondary | ICD-10-CM | POA: Diagnosis not present

## 2019-11-11 DIAGNOSIS — E039 Hypothyroidism, unspecified: Secondary | ICD-10-CM | POA: Diagnosis not present

## 2019-11-11 DIAGNOSIS — M810 Age-related osteoporosis without current pathological fracture: Secondary | ICD-10-CM | POA: Diagnosis not present

## 2019-11-11 DIAGNOSIS — R269 Unspecified abnormalities of gait and mobility: Secondary | ICD-10-CM | POA: Diagnosis not present

## 2019-11-11 DIAGNOSIS — K259 Gastric ulcer, unspecified as acute or chronic, without hemorrhage or perforation: Secondary | ICD-10-CM | POA: Diagnosis not present

## 2019-11-11 DIAGNOSIS — K922 Gastrointestinal hemorrhage, unspecified: Secondary | ICD-10-CM | POA: Diagnosis not present

## 2019-11-11 DIAGNOSIS — E785 Hyperlipidemia, unspecified: Secondary | ICD-10-CM | POA: Diagnosis not present

## 2019-11-11 DIAGNOSIS — D649 Anemia, unspecified: Secondary | ICD-10-CM | POA: Diagnosis not present

## 2019-11-11 DIAGNOSIS — K3189 Other diseases of stomach and duodenum: Secondary | ICD-10-CM | POA: Diagnosis not present

## 2019-11-11 DIAGNOSIS — R1110 Vomiting, unspecified: Secondary | ICD-10-CM | POA: Diagnosis not present

## 2019-11-11 DIAGNOSIS — K319 Disease of stomach and duodenum, unspecified: Secondary | ICD-10-CM | POA: Diagnosis not present

## 2019-11-11 DIAGNOSIS — I6381 Other cerebral infarction due to occlusion or stenosis of small artery: Secondary | ICD-10-CM | POA: Diagnosis not present

## 2019-11-11 DIAGNOSIS — R42 Dizziness and giddiness: Secondary | ICD-10-CM | POA: Diagnosis not present

## 2019-11-11 DIAGNOSIS — I480 Paroxysmal atrial fibrillation: Secondary | ICD-10-CM | POA: Diagnosis not present

## 2019-11-11 DIAGNOSIS — I4891 Unspecified atrial fibrillation: Secondary | ICD-10-CM | POA: Diagnosis not present

## 2019-11-11 DIAGNOSIS — Z7901 Long term (current) use of anticoagulants: Secondary | ICD-10-CM | POA: Diagnosis not present

## 2019-11-13 ENCOUNTER — Telehealth: Payer: Self-pay | Admitting: Cardiology

## 2019-11-13 DIAGNOSIS — K3189 Other diseases of stomach and duodenum: Secondary | ICD-10-CM | POA: Diagnosis not present

## 2019-11-13 NOTE — Telephone Encounter (Signed)
Spoke with pt's son and pt.  Pt's son reports pt was admitted to hospital in [facility] 11/07/2019 and discharged 11/12/2019.  Pt was admitted with hgb of 5.  Pt diagnosed wit bleeding ulcer per pt's son.  Pt's Eliquis was discontinued.  Pt has appointment with PCP 11/18/2019 and a pending GI appointment.  Spoke with Dr Marlou Porch who requests pt should not resume Eliquis until advised by GI and continue close f/u with PCP.  Appointment scheduled with Truitt Merle, NP for 12/03/2019 at 215pm.  Advised if pt develops CP, SOB, dizziness, slurred speech or weakness she should go directly to ED for further evaluation. Pt and son verbalize understanding and agree with current plan.

## 2019-11-13 NOTE — Telephone Encounter (Signed)
Patients son, Catie Chiao, called to advise that patient was hospitalized in [place] with heart rate of 155, Hgb got to 5, and advised that patient was also taken off of her Eliquis. Patient was advised to follow up with all of her doctors when she was released from the hospital.

## 2019-11-18 ENCOUNTER — Ambulatory Visit (INDEPENDENT_AMBULATORY_CARE_PROVIDER_SITE_OTHER): Payer: Medicare Other | Admitting: Family Medicine

## 2019-11-18 ENCOUNTER — Encounter: Payer: Self-pay | Admitting: Family Medicine

## 2019-11-18 ENCOUNTER — Encounter: Payer: Self-pay | Admitting: Physician Assistant

## 2019-11-18 ENCOUNTER — Other Ambulatory Visit: Payer: Self-pay

## 2019-11-18 VITALS — BP 130/78 | HR 115 | Temp 97.0°F | Ht 66.75 in | Wt 161.8 lb

## 2019-11-18 DIAGNOSIS — K317 Polyp of stomach and duodenum: Secondary | ICD-10-CM

## 2019-11-18 DIAGNOSIS — K922 Gastrointestinal hemorrhage, unspecified: Secondary | ICD-10-CM

## 2019-11-18 DIAGNOSIS — E039 Hypothyroidism, unspecified: Secondary | ICD-10-CM

## 2019-11-18 DIAGNOSIS — R000 Tachycardia, unspecified: Secondary | ICD-10-CM

## 2019-11-18 DIAGNOSIS — R011 Cardiac murmur, unspecified: Secondary | ICD-10-CM

## 2019-11-18 DIAGNOSIS — I6523 Occlusion and stenosis of bilateral carotid arteries: Secondary | ICD-10-CM

## 2019-11-18 DIAGNOSIS — I480 Paroxysmal atrial fibrillation: Secondary | ICD-10-CM | POA: Diagnosis not present

## 2019-11-18 DIAGNOSIS — Z8673 Personal history of transient ischemic attack (TIA), and cerebral infarction without residual deficits: Secondary | ICD-10-CM

## 2019-11-18 LAB — TSH: TSH: 15.02 u[IU]/mL — ABNORMAL HIGH (ref 0.35–4.50)

## 2019-11-18 LAB — CBC WITH DIFFERENTIAL/PLATELET
Basophils Absolute: 0.1 10*3/uL (ref 0.0–0.1)
Basophils Relative: 0.9 % (ref 0.0–3.0)
Eosinophils Absolute: 0.2 10*3/uL (ref 0.0–0.7)
Eosinophils Relative: 2.9 % (ref 0.0–5.0)
HCT: 31.4 % — ABNORMAL LOW (ref 36.0–46.0)
Hemoglobin: 10.9 g/dL — ABNORMAL LOW (ref 12.0–15.0)
Lymphocytes Relative: 17.3 % (ref 12.0–46.0)
Lymphs Abs: 1.1 10*3/uL (ref 0.7–4.0)
MCHC: 34.7 g/dL (ref 30.0–36.0)
MCV: 96.2 fl (ref 78.0–100.0)
Monocytes Absolute: 0.8 10*3/uL (ref 0.1–1.0)
Monocytes Relative: 12.2 % — ABNORMAL HIGH (ref 3.0–12.0)
Neutro Abs: 4.1 10*3/uL (ref 1.4–7.7)
Neutrophils Relative %: 66.7 % (ref 43.0–77.0)
Platelets: 329 10*3/uL (ref 150.0–400.0)
RBC: 3.26 Mil/uL — ABNORMAL LOW (ref 3.87–5.11)
RDW: 16.8 % — ABNORMAL HIGH (ref 11.5–15.5)
WBC: 6.2 10*3/uL (ref 4.0–10.5)

## 2019-11-18 LAB — RENAL FUNCTION PANEL
Albumin: 4.2 g/dL (ref 3.5–5.2)
BUN: 16 mg/dL (ref 6–23)
CO2: 28 mEq/L (ref 19–32)
Calcium: 9.5 mg/dL (ref 8.4–10.5)
Chloride: 100 mEq/L (ref 96–112)
Creatinine, Ser: 1.11 mg/dL (ref 0.40–1.20)
GFR: 46.72 mL/min — ABNORMAL LOW (ref >60.00)
Glucose, Bld: 97 mg/dL (ref 70–99)
Phosphorus: 3.6 mg/dL (ref 2.3–4.6)
Potassium: 3.9 mEq/L (ref 3.5–5.1)
Sodium: 137 mEq/L (ref 135–145)

## 2019-11-18 LAB — T4, FREE: Free T4: 0.96 ng/dL (ref 0.60–1.60)

## 2019-11-18 MED ORDER — PANTOPRAZOLE SODIUM 40 MG PO TBEC: 40.0000 mg | DELAYED_RELEASE_TABLET | Freq: Every day | ORAL | 6 refills | Status: DC

## 2019-11-18 NOTE — Patient Instructions (Addendum)
EKG today Labs today We will be in touch with results - may be increasing diltiazem dose. Keep cardiology follow up We will schedule GI follow up as well.  Continue pantoprazole, continue to hold eliquis.

## 2019-11-18 NOTE — Progress Notes (Signed)
This visit was conducted in person.  BP 130/78   Pulse (!) 115   Temp (!) 97 F (36.1 C) (Temporal)   Ht 5' 6.75" (1.695 m)   Wt 161 lb 12.8 oz (73.4 kg)   SpO2 98%   BMI 25.53 kg/m    CC: hosp f/u visit  Subjective:    Patient ID: Judy Walker, female    DOB: 23-Jun-1932, 86 y.o.   MRN: [id number]  HPI: CAMISHA SREY is a 86 y.o. female presenting on 11/18/2019 for Hospitalization Follow-up   Here with son Jenny Reichmann.   Recent hospitalization at [facility] in [place], [place]. Records received and reviewed. She had thrown up dark blood prior to ER visit. Admitted 11/11/2019, discharged 11/12/2019 with GI bleed Hgb dropped to 5.5 in setting of afib on eliquis (diagnosed 03/2019 s/p successful cardioversion 06/2019), n/v and dehydration. She had black tarry stools with drop in Hgb to 6.9 s/p1u pRBC. EGD by gen surgery showed prepyloric region broad-based polypoid mass covered with normal appearing mucosa as well as 2 deep punctate ulcers, thought pedunculated leiomyoma vs other submucosal mass likely etiology of upper GIB. CLO testing returned negative. F/u small bowel series overall reassuring as well.   Head CT showed age indeterminate lacunar infarcts with mild prominent volume loss as well as mild white matter changes, MRI didn't show acute stroke.   Started on pantoprazole 40mg  daily Eliquis was stopped as well.  D/c Hgb 8.9 and steady.   Was also found to have L>R carotid stenosis Echo shoed EF 55-60%. Normal bubble study, moderate aortic stenosis present.   Since home denies chest pain, tightness, dyspnea, dizziness, orthostatic lightheadedness. Leg swelling actually improves when home compared to time spent at the beach.   Upcoming cards appointment 12/03/2019.  Would like to see Carlean Purl (son's GI doc) or Ardis Hughs.      Relevant past medical, surgical, family and social history reviewed and updated as indicated. Interim medical history since our last visit  reviewed. Allergies and medications reviewed and updated. Outpatient Medications Prior to Visit  Medication Sig Dispense Refill  . Ascorbic Acid (VITAMIN C GUMMIE PO) Take 250 mg by mouth 4 (four) times a week.    Marland Kitchen atorvastatin (LIPITOR) 10 MG tablet TAKE 1 TABLET BY MOUTH EVERY DAY 90 tablet 3  . Calcium Carbonate (CALCIUM 500 PO) Take 500 mg by mouth daily.    . Cholecalciferol (VITAMIN D3) 25 MCG (1000 UT) CAPS Take 1 capsule (1,000 Units total) by mouth daily. 30 capsule   . diltiazem (CARDIZEM CD) 120 MG 24 hr capsule Take 1 capsule (120 mg total) by mouth daily. 90 capsule 3  . diphenhydrAMINE (BENADRYL) 25 mg capsule Take 25 mg by mouth at bedtime.     . hydrochlorothiazide (HYDRODIURIL) 25 MG tablet TAKE 1 TABLET BY MOUTH EVERY DAY 90 tablet 3  . levothyroxine (SYNTHROID) 75 MCG tablet TAKE 1 TABLET BY MOUTH EVERY DAY 90 tablet 3  . losartan (COZAAR) 100 MG tablet TAKE 1 TABLET BY MOUTH EVERY DAY 90 tablet 3  . Multiple Vitamins-Minerals (MULTIVITAMIN ADULT) TABS Take 1 tablet by mouth daily.    . potassium gluconate 595 (99 K) MG TABS tablet Take 595 mg by mouth daily.    . pantoprazole (PROTONIX) 40 MG tablet Take 40 mg by mouth daily.    Marland Kitchen apixaban (ELIQUIS) 5 MG TABS tablet Take 1 tablet (5 mg total) by mouth 2 (two) times daily. 60 tablet 11   No facility-administered  medications prior to visit.     Per HPI unless specifically indicated in ROS section below Review of Systems Objective:  BP 130/78   Pulse (!) 115   Temp (!) 97 F (36.1 C) (Temporal)   Ht 5' 6.75" (1.695 m)   Wt 161 lb 12.8 oz (73.4 kg)   SpO2 98%   BMI 25.53 kg/m   Wt Readings from Last 3 Encounters:  11/18/19 161 lb 12.8 oz (73.4 kg)  08/22/19 170 lb (77.1 kg)  06/18/19 166 lb 6.4 oz (75.5 kg)      Physical Exam Vitals and nursing note reviewed.  Constitutional:      Appearance: Normal appearance. She is not ill-appearing.  Neck:     Thyroid: No thyromegaly or thyroid tenderness.      Vascular: Carotid bruit (bilateral) present.  Cardiovascular:     Rate and Rhythm: Tachycardia present. Rhythm irregular.     Pulses: Normal pulses.     Heart sounds: Murmur (systolic) heard.   Pulmonary:     Effort: Pulmonary effort is normal. No respiratory distress.     Breath sounds: Normal breath sounds. No wheezing, rhonchi or rales.  Musculoskeletal:     Right lower leg: Edema (tr) present.     Left lower leg: Edema (tr) present.  Neurological:     Mental Status: She is alert.  Psychiatric:        Mood and Affect: Mood normal.        Behavior: Behavior normal.       Results for orders placed or performed in visit on 11/18/19  Renal function panel  Result Value Ref Range   Sodium 137 135 - 145 mEq/L   Potassium 3.9 3.5 - 5.1 mEq/L   Chloride 100 96 - 112 mEq/L   CO2 28 19 - 32 mEq/L   Albumin 4.2 3.5 - 5.2 g/dL   BUN 16 6 - 23 mg/dL   Creatinine, Ser 1.11 0.40 - 1.20 mg/dL   Glucose, Bld 97 70 - 99 mg/dL   Phosphorus 3.6 2.3 - 4.6 mg/dL   GFR 46.72 (L) >60.00 mL/min   Calcium 9.5 8.4 - 10.5 mg/dL  CBC with Differential/Platelet  Result Value Ref Range   WBC 6.2 4.0 - 10.5 K/uL   RBC 3.26 (L) 3.87 - 5.11 Mil/uL   Hemoglobin 10.9 (L) 12.0 - 15.0 g/dL   HCT 31.4 (L) 36 - 46 %   MCV 96.2 78.0 - 100.0 fl   MCHC 34.7 30.0 - 36.0 g/dL   RDW 16.8 (H) 11.5 - 15.5 %   Platelets 329.0 150 - 400 K/uL   Neutrophils Relative % 66.7 43 - 77 %   Lymphocytes Relative 17.3 12 - 46 %   Monocytes Relative 12.2 (H) 3 - 12 %   Eosinophils Relative 2.9 0 - 5 %   Basophils Relative 0.9 0 - 3 %   Neutro Abs 4.1 1.4 - 7.7 K/uL   Lymphs Abs 1.1 0.7 - 4.0 K/uL   Monocytes Absolute 0.8 0 - 1 K/uL   Eosinophils Absolute 0.2 0 - 0 K/uL   Basophils Absolute 0.1 0 - 0 K/uL  TSH  Result Value Ref Range   TSH 15.02 (H) 0.35 - 4.50 uIU/mL  T4, free  Result Value Ref Range   Free T4 0.96 0.60 - 1.60 ng/dL   Lab Results  Component Value Date   TSH 15.02 (H) 11/18/2019    EKG - NSR  rate 75, RAD, normal intervals, septal attenuation  changes largely unchanged from prior EKG 08/2019 Assessment & Plan:  This visit occurred during the SARS-CoV-2 public health emergency.  Safety protocols were in place, including screening questions prior to the visit, additional usage of staff PPE, and extensive cleaning of exam room while observing appropriate contact time as indicated for disinfecting solutions.   Problem List Items Addressed This Visit    Upper GI bleed [facility] records reviewed.  Recent hospitalization for upper GI bleed thought due to prepyloric polypoid mass thought non-malignant causing bleeding. Now off eliquis, hemoglobin had stabilized. Update CBC and will refer to GI for further eval/management of this mass. CLO testing returned negative.      Relevant Orders   Renal function panel (Completed)   CBC with Differential/Platelet (Completed)   Ambulatory referral to Gastroenterology   Tachycardia    Update EKG today to eval for recurrent afib - NSR at rate of 75.       Relevant Orders   EKG 12-Lead (Completed)   Systolic murmur    Recent echo showing moderate aortic stenosis.       Polyp of stomach    Records reviewed. Now on pantoprazole 40mg  daily, off eliquis. Update CBC. Will refer to GI.       Paroxysmal atrial fibrillation ([place])    New diagnosis 04/2019, s/p successful cardioversion 06/2019 but eliquis was continued. Now off eliquis after recent GI bleed. Will await GI evaluation prior to restarting anticoagulant. HR fasting today however EKG stable. See below.       Hypothyroidism    Update thyroid levels - recent TSH elevated during latest hospitalization. ?sick euthyroid.       Relevant Orders   TSH (Completed)   T4, free (Completed)   History of ischemic stroke    Continue statin, good BP control  Will recommend at least aspirin once GIB has resolved.       Carotid stenosis, bilateral    With old lacunar strokes - consider  VVS eval. Will await cardiology eval.           Meds ordered this encounter  Medications  . pantoprazole (PROTONIX) 40 MG tablet    Sig: Take 1 tablet (40 mg total) by mouth daily.    Dispense:  30 tablet    Refill:  6   Orders Placed This Encounter  Procedures  . Renal function panel  . CBC with Differential/Platelet  . TSH  . T4, free  . Ambulatory referral to Gastroenterology    Referral Priority:   Routine    Referral Type:   Consultation    Referral Reason:   Specialty Services Required    Number of Visits Requested:   1  . EKG 12-Lead    Patient Instructions  EKG today Labs today We will be in touch with results - may be increasing diltiazem dose. Keep cardiology follow up We will schedule GI follow up as well.  Continue pantoprazole, continue to hold eliquis.    Follow up plan: Return if symptoms worsen or fail to improve.  Ria Bush, MD

## 2019-11-19 ENCOUNTER — Telehealth: Payer: Self-pay | Admitting: Family Medicine

## 2019-11-19 ENCOUNTER — Telehealth: Payer: Self-pay

## 2019-11-19 DIAGNOSIS — R000 Tachycardia, unspecified: Secondary | ICD-10-CM | POA: Insufficient documentation

## 2019-11-19 DIAGNOSIS — K922 Gastrointestinal hemorrhage, unspecified: Secondary | ICD-10-CM | POA: Insufficient documentation

## 2019-11-19 DIAGNOSIS — Z8711 Personal history of peptic ulcer disease: Secondary | ICD-10-CM | POA: Insufficient documentation

## 2019-11-19 DIAGNOSIS — Z8673 Personal history of transient ischemic attack (TIA), and cerebral infarction without residual deficits: Secondary | ICD-10-CM | POA: Insufficient documentation

## 2019-11-19 DIAGNOSIS — I6523 Occlusion and stenosis of bilateral carotid arteries: Secondary | ICD-10-CM | POA: Insufficient documentation

## 2019-11-19 DIAGNOSIS — K317 Polyp of stomach and duodenum: Secondary | ICD-10-CM | POA: Insufficient documentation

## 2019-11-19 DIAGNOSIS — K259 Gastric ulcer, unspecified as acute or chronic, without hemorrhage or perforation: Secondary | ICD-10-CM | POA: Insufficient documentation

## 2019-11-19 NOTE — Telephone Encounter (Signed)
Noted. Will forward records to [facility] to send to GI.

## 2019-11-19 NOTE — Telephone Encounter (Signed)
-----   Message from Ria Bush, MD sent at 11/19/2019  9:13 AM EDT ----- Plz notify kidney function remains impaired - continue working on good water intake.  Thyroid function remains low - will need to monitor this - recommend recheck in 3-4 wks and if staying low will need to increase thyroid med dose.  Anemia continues to improve - Hgb up to 10.9.

## 2019-11-19 NOTE — Assessment & Plan Note (Addendum)
Update thyroid levels - recent TSH elevated during latest hospitalization. ?sick euthyroid.

## 2019-11-19 NOTE — Assessment & Plan Note (Addendum)
Update EKG today to eval for recurrent afib - NSR at rate of 75.

## 2019-11-19 NOTE — Assessment & Plan Note (Signed)
Recent echo showing moderate aortic stenosis.

## 2019-11-19 NOTE — Assessment & Plan Note (Signed)
With old lacunar strokes - consider VVS eval. Will await cardiology eval.

## 2019-11-19 NOTE — Assessment & Plan Note (Addendum)
New diagnosis 04/2019, s/p successful cardioversion 06/2019 but eliquis was continued. Now off eliquis after recent GI bleed. Will await GI evaluation prior to restarting anticoagulant. HR fasting today however EKG stable. See below.

## 2019-11-19 NOTE — Assessment & Plan Note (Addendum)
Hospital records reviewed.  Recent hospitalization for upper GI bleed thought due to prepyloric polypoid mass thought non-malignant causing bleeding. Now off eliquis, hemoglobin had stabilized. Update CBC and will refer to GI for further eval/management of this mass. CLO testing returned negative.

## 2019-11-19 NOTE — Assessment & Plan Note (Signed)
Records reviewed. Now on pantoprazole 40mg  daily, off eliquis. Update CBC. Will refer to GI.

## 2019-11-19 NOTE — Assessment & Plan Note (Signed)
Continue statin, good BP control  Will recommend at least aspirin once GIB has resolved.

## 2019-11-19 NOTE — Telephone Encounter (Signed)
Patient's daughter called. Patient was referred by Dr.G to GI.  GI scheduled an appointment on 11/26/19.  GI wanted to make sure that the records from [facility] be scanned before her appointment.

## 2019-11-19 NOTE — Telephone Encounter (Signed)
Informed patient son of lab results and recommendations.  Educated about taking thyroid medication.  Follow up lab visit made.

## 2019-11-20 NOTE — Telephone Encounter (Signed)
Records faxed to [facility].

## 2019-11-26 ENCOUNTER — Ambulatory Visit: Payer: Medicare Other | Admitting: Physician Assistant

## 2019-11-26 ENCOUNTER — Encounter: Payer: Self-pay | Admitting: Physician Assistant

## 2019-11-26 VITALS — BP 140/80 | HR 88 | Ht 66.5 in | Wt 162.4 lb

## 2019-11-26 DIAGNOSIS — K922 Gastrointestinal hemorrhage, unspecified: Secondary | ICD-10-CM | POA: Diagnosis not present

## 2019-11-26 DIAGNOSIS — D62 Acute posthemorrhagic anemia: Secondary | ICD-10-CM | POA: Diagnosis not present

## 2019-11-26 DIAGNOSIS — K3189 Other diseases of stomach and duodenum: Secondary | ICD-10-CM | POA: Diagnosis not present

## 2019-11-26 MED ORDER — PANTOPRAZOLE SODIUM 40 MG PO TBEC: 40.0000 mg | DELAYED_RELEASE_TABLET | Freq: Two times a day (BID) | ORAL | 2 refills | Status: DC

## 2019-11-26 NOTE — Progress Notes (Addendum)
Chief Complaint: Upper GI bleed  HPI:    Judy Walker is an 86 year old female with a past medical history as listed below, who was referred to me by Ria Bush, MD for a complaint of upper GI bleed.      11/18/2019 patient saw PCP and was following up after recent hospitalization in [place].  Apparently she had been admitted 11/11/2019 and discharged 11/12/2019 with a GI bleed and hemoglobin dropped to 5.5 in the setting of A. fib on Eliquis.  She had black tarry stools, they gave her 1 unit PRBCs.  She had an EGD by general surgery which showed in the prepyloric region a large broad-based polypoid mass-this was biopsied, covered with normal-appearing mucosa as well as two deep punctate ulcers, thought pedunculated leiomyoma versus other submucosal mass which were thought to likely be the source of the GI bleed.  H. pylori testing was negative.  Notes from the physician states that it is almost certainly "not a malignancy", there is suspicion that the mass was having some traction issues were is being pulled away through the pylorus.  Follow-up small bowel series also overall reassuring with no evidence of gastric outlet obstruction and underlying emptying associated possible abnormal filling defect along the antrum and pyloric channel.  she was started on pantoprazole 40 mg daily and Eliquis was stopped.  Discharged with a hemoglobin of 8.9.  Patient requested to see Dr. Carlean Purl (who is son's GI physician).    11/18/2019 hemoglobin of 10.9.    Today, the patient presents to clinic accompanied by her son who does assist with history.  She tells me that when she was at the beach it hit her all of a sudden that she felt like she was going to be sick and became nauseous and then had a very black stool, they proceeded to the hospital with work-up as above.  Since returning home patient has slowly been gaining back her strength, currently living with her sister, but tells me that at first she was using  a walker and now she was able to get dressed and showered by herself this morning.  Overall denies any abdominal pain or weight loss, heartburn or reflux.  She is currently using her Pantoprazole 40 mg daily as prescribed by the hospital.    Typically [place] out at the beach all summer long, but had to come back early this year, tells me she has checkup with cardiology next week as she was "out of rhythm" in the hospital and had to be shocked.  Her son is a patient of Dr. Celesta Aver and they requested to be with him.    Denies fever, chills, weight loss or symptoms that awaken her from sleep.  Past Medical History:  Diagnosis Date  . Benign positional vertigo 2005  . Colonoscopy refused    " I elected not to have one" Melissa reviewed 07/06/10; she continues to decline colonscopy  . Heart murmur   . HLD (hyperlipidemia)    total cholesterol 340 in 2003, NHDL 61, TG 75. NMR Lipoprofile 2005: LDL 84 (933/285), LDL glad =<120 ideally <90. Framingham study LDL goal <130  . HTN (hypertension)   . Hypothyroidism   . Osteopenia 08/2012, 09/2015   DEXA -1.9 (2014) -2.3 (2017)    Past Surgical History:  Procedure Laterality Date  . ABDOMINAL HYSTERECTOMY  2003   & BSO for cystic ovaries . No PMH of abnormal PAP  . CARDIAC CATHETERIZATION     normal  . CARDIOVERSION  N/A 06/27/2019   Procedure: CARDIOVERSION;  Surgeon: Jerline Pain, MD;  [facility]COPY;  Service: Cardiovascular;  Laterality: N/A;  . CATARACT EXTRACTION, BILATERAL  09/2011   Dr.[name]   . CYSTOSCOPY     bladder @ age 28 for painful hematuria    Current Outpatient Medications  Medication Sig Dispense Refill  . Ascorbic Acid (VITAMIN C GUMMIE PO) Take 250 mg by mouth 4 (four) times a week.    Marland Kitchen atorvastatin (LIPITOR) 10 MG tablet TAKE 1 TABLET BY MOUTH EVERY DAY 90 tablet 3  . Calcium Carbonate (CALCIUM 500 PO) Take 500 mg by mouth daily.    . Cholecalciferol (VITAMIN D3) 25 MCG (1000 UT) CAPS Take 1 capsule (1,000 Units  total) by mouth daily. 30 capsule   . diltiazem (CARDIZEM CD) 120 MG 24 hr capsule Take 1 capsule (120 mg total) by mouth daily. 90 capsule 3  . diphenhydrAMINE (BENADRYL) 25 mg capsule Take 25 mg by mouth at bedtime.     . hydrochlorothiazide (HYDRODIURIL) 25 MG tablet TAKE 1 TABLET BY MOUTH EVERY DAY 90 tablet 3  . levothyroxine (SYNTHROID) 75 MCG tablet TAKE 1 TABLET BY MOUTH EVERY DAY 90 tablet 3  . losartan (COZAAR) 100 MG tablet TAKE 1 TABLET BY MOUTH EVERY DAY 90 tablet 3  . Multiple Vitamins-Minerals (MULTIVITAMIN ADULT) TABS Take 1 tablet by mouth daily.    . pantoprazole (PROTONIX) 40 MG tablet Take 1 tablet (40 mg total) by mouth daily. 30 tablet 6  . potassium gluconate 595 (99 K) MG TABS tablet Take 595 mg by mouth daily.     No current facility-administered medications for this visit.    Allergies as of 11/26/2019  . (No Known Allergies)    Family History  Problem Relation Age of Onset  . Diabetes Father   . Heart attack Father        in 18s  . Kidney cancer Mother   . Breast cancer Sister   . Cirrhosis Sister        non alcoholic  . Stroke Sister 33  . Diabetes Other         4 brothers and  3 sisters  . Esophageal cancer Sister        non smoker ; non drinker  . Ovarian cancer Sister   . Heart attack Brother        1 pre 55  . Alzheimer's disease Sister   . Brain cancer Brother     Social History   Socioeconomic History  . Marital status: Married    Spouse name: Not on file  . Number of children: Not on file  . Years of education: Not on file  . Highest education level: Not on file  Occupational History  . Occupation: retired  Tobacco Use  . Smoking status: Never Smoker  . Smokeless tobacco: Never Used  Vaping Use  . Vaping Use: Never used  Substance and Sexual Activity  . Alcohol use: Yes    Comment:  RARELY  . Drug use: No  . Sexual activity: Not on file  Other Topics Concern  . Not on file  Social History Narrative   Lives alone. Widow  2012   Daughter lives nearby.   Occupation: retired Clinical cytogeneticist   Was Programmer, multimedia growing up   Activity: Regular exercise - walking 1-2 miles daily, stays active in yard.   Diet: daily fruits/vegetables, some water   Social Determinants of Health   Financial Resource Strain:  Low Risk   . Difficulty of Paying Living Expenses: Not hard at all  Food Insecurity: No Food Insecurity  . Worried About Charity fundraiser in the Last Year: Never true  . Ran Out of Food in the Last Year: Never true  Transportation Needs: No Transportation Needs  . Lack of Transportation (Medical): No  . Lack of Transportation (Non-Medical): No  Physical Activity: Inactive  . Days of Exercise per Week: 0 days  . Minutes of Exercise per Session: 0 min  Stress: No Stress Concern Present  . Feeling of Stress : Not at all  Social Connections:   . Frequency of Communication with Friends and Family:   . Frequency of Social Gatherings with Friends and Family:   . Attends Religious Services:   . Active Member of Clubs or Organizations:   . Attends Archivist Meetings:   Marland Kitchen Marital Status:   Intimate Partner Violence: Not At Risk  . Fear of Current or Ex-Partner: No  . Emotionally Abused: No  . Physically Abused: No  . Sexually Abused: No    Review of Systems:    Constitutional: No weight loss, fever or chills Skin: No rash Cardiovascular: No chest pain  Respiratory: No SOB  Gastrointestinal: See HPI and otherwise negative Genitourinary: No dysuria  Neurological: No headache, dizziness or syncope Musculoskeletal: No new muscle or joint pain Hematologic: No bruising Psychiatric: No history of depression or anxiety   Physical Exam:  Vital signs: BP 140/80 (BP Location: Left Arm, Patient Position: Sitting, Cuff Size: Normal)   Pulse 88   Ht 5' 6.5" (1.689 m) Comment: height measured without shoes  Wt 162 lb 6 oz (73.7 kg)   BMI 25.82 kg/m   Constitutional:   Pleasant Elderly  Caucasian female appears to be in NAD, Well developed, Well nourished, alert and cooperative Head:  Normocephalic and atraumatic. Eyes:   PEERL, EOMI. No icterus. Conjunctiva pink. Ears:  Normal auditory acuity. Neck:  Supple Throat: Oral cavity and pharynx without inflammation, swelling or lesion.  Respiratory: Respirations even and unlabored. Lungs clear to auscultation bilaterally.   No wheezes, crackles, or rhonchi.  Cardiovascular: Normal S1, S2. No MRG. Regular rate and rhythm. No peripheral edema, cyanosis or pallor.  Gastrointestinal:  Soft, nondistended, nontender. No rebound or guarding. Normal bowel sounds. No appreciable masses or hepatomegaly. Rectal:  Not performed.  Msk:  Symmetrical without gross deformities. Without edema, no deformity or joint abnormality.  Neurologic:  Alert and  oriented x4;  grossly normal neurologically.  Skin:   Dry and intact without significant lesions or rashes. Psychiatric: Demonstrates good judgement and reason without abnormal affect or behaviors.  RELEVANT LABS AND IMAGING: CBC    Component Value Date/Time   WBC 6.2 11/18/2019 1150   RBC 3.26 (L) 11/18/2019 1150   HGB 10.9 (L) 11/18/2019 1150   HGB 13.5 06/18/2019 1205   HCT 31.4 (L) 11/18/2019 1150   HCT 38.6 06/18/2019 1205   PLT 329.0 11/18/2019 1150   PLT 265 06/18/2019 1205   MCV 96.2 11/18/2019 1150   MCV 100 (H) 06/18/2019 1205   MCH 34.9 (H) 06/18/2019 1205   MCHC 34.7 11/18/2019 1150   RDW 16.8 (H) 11/18/2019 1150   RDW 11.3 (L) 06/18/2019 1205   LYMPHSABS 1.1 11/18/2019 1150   MONOABS 0.8 11/18/2019 1150   EOSABS 0.2 11/18/2019 1150   BASOSABS 0.1 11/18/2019 1150    CMP     Component Value Date/Time   NA 137 11/18/2019 1150  NA 142 06/18/2019 1205   K 3.9 11/18/2019 1150   CL 100 11/18/2019 1150   CO2 28 11/18/2019 1150   GLUCOSE 97 11/18/2019 1150   GLUCOSE 89 05/17/2006 1034   BUN 16 11/18/2019 1150   BUN 24 06/18/2019 1205   CREATININE 1.11 11/18/2019 1150    CALCIUM 9.5 11/18/2019 1150   PROT 7.3 03/26/2019 1203   ALBUMIN 4.2 11/18/2019 1150   AST 17 03/26/2019 1203   ALT 15 03/26/2019 1203   ALKPHOS 68 03/26/2019 1203   BILITOT 0.9 03/26/2019 1203   GFRNONAA 56 (L) 06/18/2019 1205   GFRAA 64 06/18/2019 1205    Assessment: 1.  Upper GI bleed: Hospitalization 6/8 in [employer] with an upper GI bleed, EGD at that time with 2 gastric ulcers and a mass which was thought to be benign, but we do not have biopsies yet, no NSAID history, H. pylori testing negative 2.  Acute blood loss anemia: Related to above, now improving  Plan: 1.  We are requesting biopsy results from [facility].  Per physician at the time it looked benign. 2.  Scheduled the patient for repeat EGD in 4 weeks with Dr. Carlean Purl in the [facility].  Did discuss risks, benefits, limitations and alternatives and the patient agrees to proceed. 3.  Currently patient's Eliquis is on hold, if this is restarted at all prior to her procedures it will need to be held for 2 days prior to EGD.  Will discuss blood thinner with Dr. Carlean Purl. 4.  Increased Pantoprazole to 40 mg twice daily due to finding of gastric ulcers.  Recommend she continue this for 8 weeks and then decrease back down to once daily dosing, pending results of EGD.  Prescribed #60 with 1 refill. 5.  Patient to follow in clinic per recommendations from Dr. Carlean Purl after time of procedure.  Ellouise Newer, PA-C [facility] 11/26/2019, 1:18 PM  Cc: Ria Bush, MD   Addendum: Right after seeing the patient we received pathology.  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  We will call and let the patient know that this is benign.  Ellouise Newer, PA-C   Case discussed and also reviewed w/ Dr. Ardis Hughs who does EUS. She could need an EUS if true submucosal mass.  We think CT abdomen with IV contrast is a good first step - and will decide what is next from there.  Gatha Mayer, MD, [facility]

## 2019-11-26 NOTE — Progress Notes (Signed)
CARDIOLOGY OFFICE NOTE  Date:  12/03/2019    [name] Date of Birth: 12-Feb-1933 Medical Record #[id number]  PCP:  Ria Bush, MD  Cardiologist:  [facility]  Chief Complaint  Patient presents with  . Follow-up    History of Present Illness: Judy Walker is a 86 y.o. female who presents today for a post hospital visit. Seen for Dr. Marlou Porch.   She has a history of HTN, HLD, mild AS noted on echo and PAF with prior cardioversion in 06/2019. FH + for early CAD.   Was admitted down at [facility] earlier this month - had GI bleed - HGB down to 5.5. She was transfused. 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 Started on PPI - Eliquis was stopped. Discharged with HGB of 8.9. Looks to have had AF as well. Looks to be having repeat EGD in 4 weeks.   The patient does not have symptoms concerning for COVID-19 infection (fever, chills, cough, or new shortness of breath).   Comes in today. Here with her son. She is for CT scan later this week. For EGD on July 23rd with Dr. Carlean Purl. Blood count has come up to about 10. Apparently with some carotid disease - this is to be followed. Her feet have been swelling - better now - was only for a few days - she has been on her feet more - she was eating out over the weekend for her birthday. TSH levels are pretty high - this is to be rechecked. No bleeding. Her stool is normal now. No palpitations. She is aware that she is not protected from stroke. She typically does not have awareness of her AF - but did note this while she was in the hospital while at the beach. She is anxious to return back to her home here in [place].   Past Medical History:  Diagnosis Date  . Anemia   . Atrial fibrillation ([place])   . Benign positional vertigo 2005  . Colonoscopy refused    " I elected not to have one" [place] reviewed 07/06/10; she continues to decline colonscopy  . Heart murmur   . HLD (hyperlipidemia)    total cholesterol 340  in 2003, NHDL 61, TG 75. NMR Lipoprofile 2005: LDL 84 (933/285), LDL glad =<120 ideally <90. Framingham study LDL goal <130  . HTN (hypertension)   . Hypothyroidism   . Osteopenia 08/2012, 09/2015   DEXA -1.9 (2014) -2.3 (2017)    Past Surgical History:  Procedure Laterality Date  . ABDOMINAL HYSTERECTOMY  2003   & BSO for cystic ovaries . No PMH of abnormal PAP  . CARDIAC CATHETERIZATION     normal  . CARDIOVERSION N/A 06/27/2019   Procedure: CARDIOVERSION;  Surgeon: Jerline Pain, MD;  Location: [facility];  Service: Cardiovascular;  Laterality: N/A;  . CATARACT EXTRACTION, BILATERAL  09/2011   Dr.[name]   . CYSTOSCOPY     bladder @ age 62 for painful hematuria     Medications: Current Meds  Medication Sig  . Ascorbic Acid (VITAMIN C GUMMIE PO) Take 250 mg by mouth daily.   Marland Kitchen atorvastatin (LIPITOR) 10 MG tablet TAKE 1 TABLET BY MOUTH EVERY DAY  . Calcium Carbonate (CALCIUM 500 PO) Take 500 mg by mouth daily.  . Cholecalciferol (VITAMIN D3) 25 MCG (1000 UT) CAPS Take 1 capsule (1,000 Units total) by mouth daily.  Marland Kitchen diltiazem (CARDIZEM CD) 120 MG 24 hr capsule Take 1 capsule (120 mg  total) by mouth daily.  . diphenhydrAMINE (BENADRYL) 25 mg capsule Take 25 mg by mouth at bedtime.   . hydrochlorothiazide (HYDRODIURIL) 25 MG tablet TAKE 1 TABLET BY MOUTH EVERY DAY  . levothyroxine (SYNTHROID) 75 MCG tablet TAKE 1 TABLET BY MOUTH EVERY DAY  . losartan (COZAAR) 100 MG tablet TAKE 1 TABLET BY MOUTH EVERY DAY  . Multiple Vitamins-Minerals (MULTIVITAMIN ADULT) TABS Take 1 tablet by mouth daily.  . pantoprazole (PROTONIX) 40 MG tablet Take 1 tablet (40 mg total) by mouth 2 (two) times daily.  . potassium gluconate 595 (99 K) MG TABS tablet Take 595 mg by mouth daily.     Allergies: No Known Allergies  Social History: The patient  reports that she has never smoked. She has never used smokeless tobacco. She reports current alcohol use. She reports that she does not use drugs.    Family History: The patient's family history includes Alzheimer's disease in her sister; Brain cancer in her brother; Breast cancer in her sister and sister; Cancer in her maternal grandmother; Cirrhosis in her sister; Diabetes in her father, paternal grandfather, and another family member; Heart attack in her brother, brother, and father; Heart disease in her brother; Kidney cancer in her mother; Other in her brother and brother; Ovarian cancer in her sister; Stroke in her sister; Ulcerative colitis in her son.   Review of Systems: Please see the history of present illness.   All other systems are reviewed and negative.   Physical Exam: VS:  BP 130/90   Pulse 90   Ht 5' 6.5" (1.689 m)   Wt 160 lb 12.8 oz (72.9 kg)   SpO2 95%   BMI 25.56 kg/m  .  BMI Body mass index is 25.56 kg/m.  Wt Readings from Last 3 Encounters:  12/03/19 160 lb 12.8 oz (72.9 kg)  11/26/19 162 lb 6 oz (73.7 kg)  11/18/19 161 lb 12.8 oz (73.4 kg)    General: Pleasant. Alert and in no acute distress.  Looks younger than her stated age. She is very suntanned.  HEENT: Normal.  Neck: Supple, no JVD, carotid bruits, or masses noted.  Cardiac: Irregular irregular rhythm. Outflow murmur. Her rate is a little fast. No edema.  Respiratory:  Lungs are clear to auscultation bilaterally with normal work of breathing.  GI: Soft and nontender.  MS: No deformity or atrophy. Gait and ROM intact.  Skin: Warm and dry. Color is normal.  Neuro:  Strength and sensation are intact and no gross focal deficits noted.  Psych: Alert, appropriate and with normal affect.   LABORATORY DATA:  EKG:  EKG is ordered today.  Personally reviewed by me. This demonstrates .  Lab Results  Component Value Date   WBC 6.2 11/18/2019   HGB 10.9 (L) 11/18/2019   HCT 31.4 (L) 11/18/2019   PLT 329.0 11/18/2019   GLUCOSE 97 11/18/2019   CHOL 186 03/26/2019   TRIG 146.0 03/26/2019   HDL 56.70 03/26/2019   LDLDIRECT 97.0 09/10/2015   LDLCALC  100 (H) 03/26/2019   ALT 15 03/26/2019   AST 17 03/26/2019   NA 137 11/18/2019   K 3.9 11/18/2019   CL 100 11/18/2019   CREATININE 1.11 11/18/2019   BUN 16 11/18/2019   CO2 28 11/18/2019   TSH 15.02 (H) 11/18/2019   MICROALBUR 0.7 03/26/2019     BNP (last 3 results) No results for input(s): BNP in the last 8760 hours.  ProBNP (last 3 results) No results for input(s): PROBNP in  the last 8760 hours.   Other Studies Reviewed Today:  ECHO IMPRESSIONS 05/2019  1. Left ventricular ejection fraction, by visual estimation, is 60 to  65%. The left ventricle has normal function. There is mildly increased  left ventricular hypertrophy.  2. Left ventricular diastolic parameters are indeterminate.  3. Global right ventricle has normal systolic function.The right  ventricular size is normal. No increase in right ventricular wall  thickness.  4. Left atrial size was normal.  5. Right atrial size was mild-moderately dilated.  6. Mild mitral annular calcification.  7. The mitral valve is abnormal. Mild mitral valve regurgitation. No  evidence of mitral stenosis.  8. The tricuspid valve is normal in structure. Tricuspid valve  regurgitation mild-moderate.  9. The aortic valve is abnormal. Aortic valve regurgitation is trivial.  Mild aortic valve stenosis. Moderate reduced leaflet excursion.  10. Aortic valve mean gradient measures 10.3 mmHg.  11. The pulmonic valve was normal in structure. Pulmonic valve  regurgitation is trivial.  12. Normal pulmonary artery systolic pressure.  13. The inferior vena cava is normal in size with greater than 50%  respiratory variability, suggesting right atrial pressure of 3 mmHg.  14. The left ventricle has no regional wall motion abnormalities.   ASSESSMENT & PLAN:    1. PAF - prior cardioversion back in January of 2021 - has had prior recurrence - probably due to profound anemia. Checking EKG today. She is back in AF. Her rate is just  fair - increasing her Diltiazem to 180 mg a day. She understands that she is at increased risk of stroke given that her Eliquis is hold due to profound GI bleed. Anticoagulation is not an option at this time.   2. Recent GI bleed - HGB down to 5.5 - has required transfusion x 2 - on PPI - Eliquis is on hold - for repeat EGD next month.   3. HTN - BP is fair. Would follow. On Losartan and HCT along with Cardizem.   4. Mild AS - would follow. No cardinal symptoms.   5. HLD - not discussed.   6. Reported carotid disease - would follow.   7. Marked hypothyroidism - for repeat TSH with PCP.   Current medicines are reviewed with the patient today.  The patient does not have concerns regarding medicines other than what has been noted above.  The following changes have been made:  See above.  Labs/ tests ordered today include:   No orders of the defined types were placed in this encounter.    Disposition:   FU with [place] in about 6 to 8 weeks. She has upcoming labs and EGD.    Patient is agreeable to this plan and will call if any problems develop in the interim.   SignedTruitt Merle, NP  12/03/2019 2:50 PM  [facility] [address] [place], [place]  [address] Phone: [phone] Fax: [phone]

## 2019-11-26 NOTE — Patient Instructions (Signed)
If you are age 84 or older, your body mass index should be between 23-30. Your Body mass index is 25.82 kg/m. If this is out of the aforementioned range listed, please consider follow up with your Primary Care Provider.  If you are age 32 or younger, your body mass index should be between 19-25. Your Body mass index is 25.82 kg/m. If this is out of the aformentioned range listed, please consider follow up with your Primary Care Provider.   You have been scheduled for an endoscopy. Please follow written instructions given to you at your visit today. If you use inhalers (even only as needed), please bring them with you on the day of your procedure.  Increase your Pantoprazole to twice daily for 8 weeks, then decrease back down to once daily dosing. New prescription for twice daily dosing has been sent to pharmacy.   Due to recent changes in healthcare laws, you may see the results of your imaging and laboratory studies on MyChart before your provider has had a chance to review them.  We understand that in some cases there may be results that are confusing or concerning to you. Not all laboratory results come back in the same time frame and the provider may be waiting for multiple results in order to interpret others.  Please give [place] 48 hours in order for your provider to thoroughly review all the results before contacting the office for clarification of your results.   Thank you for choosing me and [facility].  Dennison Bulla

## 2019-11-28 ENCOUNTER — Telehealth: Payer: Self-pay

## 2019-11-28 DIAGNOSIS — K3189 Other diseases of stomach and duodenum: Secondary | ICD-10-CM

## 2019-11-28 NOTE — Telephone Encounter (Signed)
-----   Message from Levin Erp, [place] sent at 11/28/2019  8:43 AM EDT ----- Regarding: FW: Please review  ----- Message ----- From: Gatha Mayer, MD Sent: 11/27/2019   2:03 PM EDT To: Levin Erp, PA Subject: RE: Please review                              Sent the addendum and left the records on your desk  Let's do an abdominal (no pelvic) CT with contrast  ----- Message ----- From: Levin Erp, PA Sent: 11/26/2019   1:53 PM EDT To: Gatha Mayer, MD Subject: Please review                                  Can you please review this chart from today, patient had an EGD at at [facility] the beginning of June which showed a gastric mass, apparently looked benign, we are awaiting biopsy results, also two ulcers, I scheduled her for repeat EGD in 4 weeks to ensure healing and increase her Pantoprazole to BID.  Make sure this is what you would recommend.  Also her blood thinners currently on hold, she is on Eliquis typically, she is due to see cardiology next week, I have a feeling they are going to want to restart it in the interim for A. fib.  Likely will need to follow with this to make sure she is not taking it prior to time of Endo.  Thank you, Ellouise Newer, PA-C

## 2019-11-28 NOTE — Telephone Encounter (Signed)
The patient has been notified of this information and all questions answered.   You are scheduled at [facility] on 12/05/19 at 3 pm. You should arrive 15 minutes prior to your appointment time for registration. Nothing to eat or drink 4 hours prior to the appointment.  Pick up contrast at least 2 days prior to the appt.

## 2019-12-03 ENCOUNTER — Other Ambulatory Visit: Payer: Self-pay

## 2019-12-03 ENCOUNTER — Ambulatory Visit: Payer: Medicare Other | Admitting: Nurse Practitioner

## 2019-12-03 ENCOUNTER — Encounter: Payer: Self-pay | Admitting: Nurse Practitioner

## 2019-12-03 VITALS — BP 130/90 | HR 90 | Ht 66.5 in | Wt 160.8 lb

## 2019-12-03 DIAGNOSIS — I480 Paroxysmal atrial fibrillation: Secondary | ICD-10-CM | POA: Diagnosis not present

## 2019-12-03 DIAGNOSIS — K922 Gastrointestinal hemorrhage, unspecified: Secondary | ICD-10-CM

## 2019-12-03 DIAGNOSIS — I350 Nonrheumatic aortic (valve) stenosis: Secondary | ICD-10-CM

## 2019-12-03 DIAGNOSIS — I10 Essential (primary) hypertension: Secondary | ICD-10-CM

## 2019-12-03 DIAGNOSIS — R011 Cardiac murmur, unspecified: Secondary | ICD-10-CM

## 2019-12-03 DIAGNOSIS — I4819 Other persistent atrial fibrillation: Secondary | ICD-10-CM

## 2019-12-03 MED ORDER — DILTIAZEM HCL ER COATED BEADS 180 MG PO CP24
180.0000 mg | ORAL_CAPSULE | Freq: Every day | ORAL | 3 refills | Status: DC
Start: 2019-12-03 — End: 2020-01-13

## 2019-12-03 NOTE — Patient Instructions (Addendum)
After Visit Summary:  We will be checking the following labs today - NONE   Medication Instructions:    Continue with your current medicines. BUT  I am increasing the Diltiazem to 180 mg a day - this is to help slow your heart down a little more.   You are in atrial fibrillation today.    If you need a refill on your cardiac medications before your next appointment, please call your pharmacy.     Testing/Procedures To Be Arranged:  N/A  Follow-Up:   See me or Dr. Marlou Porch in about 6 weeks.     At [facility], you and your health needs are our priority.  As part of our continuing mission to provide you with exceptional heart care, we have created designated Provider Care Teams.  These Care Teams include your primary Cardiologist (physician) and Advanced Practice Providers (APPs -  Physician Assistants and Nurse Practitioners) who all work together to provide you with the care you need, when you need it.  Special Instructions:  . Stay safe, wash your hands for at least 20 seconds and wear a mask when needed.  . It was good to talk with you today.    Call the [place] office at [phone] if you have any questions, problems or concerns.

## 2019-12-05 ENCOUNTER — Other Ambulatory Visit: Payer: Self-pay

## 2019-12-05 ENCOUNTER — Ambulatory Visit (HOSPITAL_COMMUNITY)
Admission: RE | Admit: 2019-12-05 | Discharge: 2019-12-05 | Disposition: A | Discharge status: Home / Self Care | Payer: Medicare Other | Source: Ambulatory Visit | Attending: Internal Medicine | Admitting: Internal Medicine

## 2019-12-05 DIAGNOSIS — K3189 Other diseases of stomach and duodenum: Secondary | ICD-10-CM

## 2019-12-05 DIAGNOSIS — I700 Atherosclerosis of aorta: Secondary | ICD-10-CM | POA: Diagnosis not present

## 2019-12-05 DIAGNOSIS — K449 Diaphragmatic hernia without obstruction or gangrene: Secondary | ICD-10-CM | POA: Diagnosis not present

## 2019-12-05 DIAGNOSIS — K6389 Other specified diseases of intestine: Secondary | ICD-10-CM | POA: Diagnosis not present

## 2019-12-05 HISTORY — PX: ESOPHAGOGASTRODUODENOSCOPY: SHX1529

## 2019-12-05 MED ORDER — IOHEXOL 300 MG/ML  SOLN
100.0000 mL | Freq: Once | INTRAMUSCULAR | Status: AC | PRN
  Administered 2019-12-05: 100 mL via INTRAVENOUS

## 2019-12-15 ENCOUNTER — Other Ambulatory Visit: Payer: Self-pay | Admitting: Family Medicine

## 2019-12-15 DIAGNOSIS — E039 Hypothyroidism, unspecified: Secondary | ICD-10-CM

## 2019-12-15 DIAGNOSIS — I480 Paroxysmal atrial fibrillation: Secondary | ICD-10-CM

## 2019-12-15 DIAGNOSIS — E559 Vitamin D deficiency, unspecified: Secondary | ICD-10-CM

## 2019-12-15 DIAGNOSIS — E785 Hyperlipidemia, unspecified: Secondary | ICD-10-CM

## 2019-12-15 DIAGNOSIS — K922 Gastrointestinal hemorrhage, unspecified: Secondary | ICD-10-CM

## 2019-12-16 ENCOUNTER — Other Ambulatory Visit: Payer: Self-pay

## 2019-12-17 ENCOUNTER — Other Ambulatory Visit (INDEPENDENT_AMBULATORY_CARE_PROVIDER_SITE_OTHER): Payer: Medicare Other

## 2019-12-17 DIAGNOSIS — I480 Paroxysmal atrial fibrillation: Secondary | ICD-10-CM

## 2019-12-17 DIAGNOSIS — E039 Hypothyroidism, unspecified: Secondary | ICD-10-CM

## 2019-12-17 DIAGNOSIS — K922 Gastrointestinal hemorrhage, unspecified: Secondary | ICD-10-CM | POA: Diagnosis not present

## 2019-12-17 LAB — RENAL FUNCTION PANEL
Albumin: 4.3 g/dL (ref 3.5–5.2)
BUN: 19 mg/dL (ref 6–23)
CO2: 30 mEq/L (ref 19–32)
Calcium: 9.5 mg/dL (ref 8.4–10.5)
Chloride: 99 mEq/L (ref 96–112)
Creatinine, Ser: 1.06 mg/dL (ref 0.40–1.20)
GFR: 49.26 mL/min — ABNORMAL LOW (ref >60.00)
Glucose, Bld: 97 mg/dL (ref 70–99)
Phosphorus: 4 mg/dL (ref 2.3–4.6)
Potassium: 4.2 mEq/L (ref 3.5–5.1)
Sodium: 138 mEq/L (ref 135–145)

## 2019-12-17 LAB — CBC WITH DIFFERENTIAL/PLATELET
Basophils Absolute: 0.1 10*3/uL (ref 0.0–0.1)
Basophils Relative: 1.2 % (ref 0.0–3.0)
Eosinophils Absolute: 0.3 10*3/uL (ref 0.0–0.7)
Eosinophils Relative: 6.2 % — ABNORMAL HIGH (ref 0.0–5.0)
HCT: 33.6 % — ABNORMAL LOW (ref 36.0–46.0)
Hemoglobin: 11.4 g/dL — ABNORMAL LOW (ref 12.0–15.0)
Lymphocytes Relative: 21.6 % (ref 12.0–46.0)
Lymphs Abs: 1 10*3/uL (ref 0.7–4.0)
MCHC: 33.9 g/dL (ref 30.0–36.0)
MCV: 98.3 fl (ref 78.0–100.0)
Monocytes Absolute: 0.6 10*3/uL (ref 0.1–1.0)
Monocytes Relative: 12.8 % — ABNORMAL HIGH (ref 3.0–12.0)
Neutro Abs: 2.6 10*3/uL (ref 1.4–7.7)
Neutrophils Relative %: 58.2 % (ref 43.0–77.0)
Platelets: 258 10*3/uL (ref 150.0–400.0)
RBC: 3.42 Mil/uL — ABNORMAL LOW (ref 3.87–5.11)
RDW: 16.4 % — ABNORMAL HIGH (ref 11.5–15.5)
WBC: 4.4 10*3/uL (ref 4.0–10.5)

## 2019-12-17 LAB — T4, FREE: Free T4: 1.32 ng/dL (ref 0.60–1.60)

## 2019-12-17 LAB — TSH: TSH: 5.17 u[IU]/mL — ABNORMAL HIGH (ref 0.35–4.50)

## 2019-12-19 ENCOUNTER — Telehealth: Payer: Self-pay | Admitting: *Deleted

## 2019-12-19 ENCOUNTER — Other Ambulatory Visit: Payer: Self-pay | Admitting: Family Medicine

## 2019-12-19 MED ORDER — LEVOTHYROXINE SODIUM 75 MCG PO TABS: 75.0000 ug | ORAL_TABLET | Freq: Every day | ORAL | 3 refills | Status: DC

## 2019-12-19 NOTE — Telephone Encounter (Signed)
Pt left a message at Triage requesting the status of her lab results. I did advise pt that they just got back and PCP hasn't reviewed them yet but once he comments on labs his assistant will call her back. Pt said it's no rush she just didn't know if she had to call us or we call her. She will wait for CMA's call back regarding lab results.  FYI to PCP and CMA

## 2019-12-24 ENCOUNTER — Ambulatory Visit: Payer: Medicare Other | Admitting: Physician Assistant

## 2019-12-27 ENCOUNTER — Ambulatory Visit (AMBULATORY_SURGERY_CENTER): Payer: Medicare Other | Admitting: Internal Medicine

## 2019-12-27 ENCOUNTER — Other Ambulatory Visit: Payer: Self-pay

## 2019-12-27 ENCOUNTER — Encounter: Payer: Self-pay | Admitting: Internal Medicine

## 2019-12-27 VITALS — BP 124/67 | HR 79 | Temp 97.8°F | Resp 12 | Ht 66.0 in | Wt 162.0 lb

## 2019-12-27 DIAGNOSIS — K922 Gastrointestinal hemorrhage, unspecified: Secondary | ICD-10-CM

## 2019-12-27 DIAGNOSIS — K254 Chronic or unspecified gastric ulcer with hemorrhage: Secondary | ICD-10-CM | POA: Diagnosis not present

## 2019-12-27 DIAGNOSIS — K449 Diaphragmatic hernia without obstruction or gangrene: Secondary | ICD-10-CM | POA: Diagnosis not present

## 2019-12-27 DIAGNOSIS — R933 Abnormal findings on diagnostic imaging of other parts of digestive tract: Secondary | ICD-10-CM | POA: Diagnosis not present

## 2019-12-27 DIAGNOSIS — K3189 Other diseases of stomach and duodenum: Secondary | ICD-10-CM

## 2019-12-27 DIAGNOSIS — Z8719 Personal history of other diseases of the digestive system: Secondary | ICD-10-CM | POA: Diagnosis not present

## 2019-12-27 MED ORDER — SODIUM CHLORIDE 0.9 % IV SOLN: 500.0000 mL | Freq: Once | INTRAVENOUS | Status: DC

## 2019-12-27 NOTE — Op Note (Signed)
[facility] Patient Name: Judy Walker Procedure Date: 12/27/2019 10:06 AM MRN: [id number] Endoscopist: Gatha Mayer , MD Age: 86 Referring MD:  Date of Birth: Mar 02, 1933 Gender: Female Account #: [id number] Procedure:                Upper GI endoscopy Indications:              Abnormal CT of the GI tract thickened in antrum,                            s/p ulcers and bleeding in that region Medicines:                Propofol per Anesthesia, Monitored Anesthesia Care Procedure:                Pre-Anesthesia Assessment:                           - Prior to the procedure, a History and Physical                            was performed, and patient medications and                            allergies were reviewed. The patient's tolerance of                            previous anesthesia was also reviewed. The risks                            and benefits of the procedure and the sedation                            options and risks were discussed with the patient.                            All questions were answered, and informed consent                            was obtained. Prior Anticoagulants: The patient has                            taken no previous anticoagulant or antiplatelet                            agents. ASA Grade Assessment: III - A patient with                            severe systemic disease. After reviewing the risks                            and benefits, the patient was deemed in                            satisfactory condition to undergo the procedure.  After obtaining informed consent, the endoscope was                            passed under direct vision. Throughout the                            procedure, the patient's blood pressure, pulse, and                            oxygen saturations were monitored continuously. The                            Endoscope was introduced through the mouth, and                             advanced to the second part of duodenum. The upper                            GI endoscopy was accomplished without difficulty.                            The patient tolerated the procedure well. Scope In: Scope Out: Findings:                 Localized mucosal changes characterized by                            thickened folds were found in the prepyloric region                            of the stomach. Biopsies were taken with a cold                            forceps for histology. Verification of patient                            identification for the specimen was done. Estimated                            blood loss was minimal.                           A 4 cm hiatal hernia was present.                           The exam was otherwise without abnormality. Complications:            No immediate complications. Estimated Blood Loss:     Estimated blood loss was minimal. Impression:               I think this is scarring and not malignancy. Ulcers                            are healed.                           -  Thickened folds mucosa in the prepyloric region                            of the stomach. Biopsied.                           - 4 cm hiatal hernia.                           - The examination was otherwise normal. Recommendation:           - Patient has a contact number available for                            emergencies. The signs and symptoms of potential                            delayed complications were discussed with the                            patient. Return to normal activities tomorrow.                            Written discharge instructions were provided to the                            patient.                           - Resume previous diet.                           - Continue present medications.                           - Await pathology results.                           - OK to resume Eliquis Gatha Mayer, MD 12/27/2019 10:37:45  AM This report has been signed electronically.

## 2019-12-27 NOTE — Progress Notes (Signed)
Called to room to assist during endoscopic procedure.  Patient ID and intended procedure confirmed with present staff. Received instructions for my participation in the procedure from the performing physician.  

## 2019-12-27 NOTE — Progress Notes (Signed)
Pt's states no medical or surgical changes since previsit or office visit.  CW - vitals 

## 2019-12-27 NOTE — Patient Instructions (Addendum)
No ulcers seen. The stomach lining is a bit thick where they were - I do not suspect cancer but took biopsies to check it.  I will let you know.  I believe it is ok to restart Eliquis.  I appreciate the opportunity to care for you. Gatha Mayer, MD, FACG   YOU HAD AN ENDOSCOPIC PROCEDURE TODAY AT [facility]:   Refer to the procedure report that was given to you for any specific questions about what was found during the examination.  If the procedure report does not answer your questions, please call your gastroenterologist to clarify.  If you requested that your care partner not be given the details of your procedure findings, then the procedure report has been included in a sealed envelope for you to review at your convenience later.  YOU SHOULD EXPECT: Some feelings of bloating in the abdomen. Passage of more gas than usual.  Walking can help get rid of the air that was put into your GI tract during the procedure and reduce the bloating. If you had a lower endoscopy (such as a colonoscopy or flexible sigmoidoscopy) you may notice spotting of blood in your stool or on the toilet paper. If you underwent a bowel prep for your procedure, you may not have a normal bowel movement for a few days.  Please Note:  You might notice some irritation and congestion in your nose or some drainage.  This is from the oxygen used during your procedure.  There is no need for concern and it should clear up in a day or so.  SYMPTOMS TO REPORT IMMEDIATELY:   Following upper endoscopy (EGD)  Vomiting of blood or coffee ground material  New chest pain or pain under the shoulder blades  Painful or persistently difficult swallowing  New shortness of breath  Fever of 100F or higher  Black, tarry-looking stools  For urgent or emergent issues, a gastroenterologist can be reached at any hour by calling [phone]. Do not use MyChart messaging for urgent concerns.    DIET:  We do  recommend a small meal at first, but then you may proceed to your regular diet.  Drink plenty of fluids but you should avoid alcoholic beverages for 24 hours.  MEDICATIONS: Continue present medications. It's OK to resume Eliquis.  Please see handouts given to you by your recovery nurse.  MEDICATIONS  ACTIVITY:  You should plan to take it easy for the rest of today and you should NOT DRIVE or use heavy machinery until tomorrow (because of the sedation medicines used during the test).    FOLLOW UP: Our staff will call the number listed on your records 48-72 hours following your procedure to check on you and address any questions or concerns that you may have regarding the information given to you following your procedure. If we do not reach you, we will leave a message.  We will attempt to reach you two times.  During this call, we will ask if you have developed any symptoms of COVID 19. If you develop any symptoms (ie: fever, flu-like symptoms, shortness of breath, cough etc.) before then, please call [phone].  If you test positive for Covid 19 in the 2 weeks post procedure, please call and report this information to [place].    If any biopsies were taken you will be contacted by phone or by letter within the next 1-3 weeks.  Please call us at [phone] if you have not heard about  the biopsies in 3 weeks.   Thank you for allowing [place] to provide for your healthcare needs today.   SIGNATURES/CONFIDENTIALITY: You and/or your care partner have signed paperwork which will be entered into your electronic medical record.  These signatures attest to the fact that that the information above on your After Visit Summary has been reviewed and is understood.  Full responsibility of the confidentiality of this discharge information lies with you and/or your care-partner.

## 2019-12-27 NOTE — Progress Notes (Signed)
PT taken to PACU. Monitors in place. VSS. Report given to RN. 

## 2019-12-31 ENCOUNTER — Telehealth: Payer: Self-pay

## 2019-12-31 NOTE — Progress Notes (Signed)
CARDIOLOGY OFFICE NOTE  Date:  01/13/2020    Judy Walker Date of Birth: 04-09-1933 Medical Record #[id number]  PCP:  Ria Bush, MD  Cardiologist:  Marisa Cyphers   Chief Complaint  Patient presents with  . Follow-up    Seen for Dr. Marlou Porch    History of Present Illness: Judy Walker is a 86 y.o. female who presents today for a 1 month check. Seen for Dr. Marlou Porch.   She has a history of HTN, HLD, mild AS noted on echo and PAF with prior cardioversion in 06/2019. FH + for early CAD.   Was admitted down at [facility] earlier in June - had GI bleed - HGB down to 5.5. She was transfused. 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 Started on PPI - Eliquis was stopped. Discharged with HGB of 8.9. Looks to have had AF as well. Was to have repeat EGD in 4 weeks.   I then saw her for follow up - was having EGD on July 23rd with Dr. Carlean Purl. Remained off anticoagulation. Blood count was up to about 10. TSH was high and this was to be rechecked.  She was aware that she was not currently protected from stroke. She typically does not have awareness of her AF - but did note this while she was in the hospital while at the beach.   Comes in today. Here alone today. She feels great. Back "shagging" last week at the beach last week - had a great time. Had her EGD as planned (I am not able to access this in Epic today) - ulcers reportedly healed. Biopsies were all ok. Was released from GI. Was told she could restart her Eliquis but she wanted to wait til she came here. She feels much better. PPI was cut back to one a day. She has no complaints of palpitations. She feels "wonderful".   Past Medical History:  Diagnosis Date  . Anemia   . Atrial fibrillation ([place])   . Benign positional vertigo 2005  . Colonoscopy refused    " I elected not to have one" [place] reviewed 07/06/10; she continues to decline colonscopy  . Heart murmur   . HLD (hyperlipidemia)    total  cholesterol 340 in 2003, NHDL 61, TG 75. NMR Lipoprofile 2005: LDL 84 (933/285), LDL glad =<120 ideally <90. Framingham study LDL goal <130  . HTN (hypertension)   . Hypothyroidism   . Osteopenia 08/2012, 09/2015   DEXA -1.9 (2014) -2.3 (2017)    Past Surgical History:  Procedure Laterality Date  . ABDOMINAL HYSTERECTOMY  2003   & BSO for cystic ovaries . No PMH of abnormal PAP  . CARDIAC CATHETERIZATION     normal  . CARDIOVERSION N/A 06/27/2019   Procedure: CARDIOVERSION;  Surgeon: Jerline Pain, MD;  Location: [place];  Service: Cardiovascular;  Laterality: N/A;  . CATARACT EXTRACTION, BILATERAL  09/2011   Dr.[name]   . CYSTOSCOPY     bladder @ age 54 for painful hematuria  . ESOPHAGOGASTRODUODENOSCOPY  12/2019   prepyloric scarring, 4cm HH, biopsies with healing ulcer, neg H pylori Carlean Purl)     Medications: Current Meds  Medication Sig  . Ascorbic Acid (VITAMIN C GUMMIE PO) Take 250 mg by mouth daily.   Marland Kitchen atorvastatin (LIPITOR) 10 MG tablet TAKE 1 TABLET BY MOUTH EVERY DAY  . Calcium Carbonate (CALCIUM 500 PO) Take 500 mg by mouth daily.  . Cholecalciferol (VITAMIN D3) 25 MCG (  1000 UT) CAPS Take 1 capsule (1,000 Units total) by mouth daily.  . diphenhydrAMINE (BENADRYL) 25 mg capsule Take 25 mg by mouth at bedtime.   . hydrochlorothiazide (HYDRODIURIL) 25 MG tablet TAKE 1 TABLET BY MOUTH EVERY DAY  . levothyroxine (SYNTHROID) 75 MCG tablet Take 1 tablet (75 mcg total) by mouth daily. One day a week take 1.5 tablets (117.15mcg)  . losartan (COZAAR) 100 MG tablet TAKE 1 TABLET BY MOUTH EVERY DAY  . Multiple Vitamins-Minerals (MULTIVITAMIN ADULT) TABS Take 1 tablet by mouth daily.  . pantoprazole (PROTONIX) 40 MG tablet Take 40 mg by mouth daily.  . potassium gluconate 595 (99 K) MG TABS tablet Take 595 mg by mouth daily.  . [DISCONTINUED] diltiazem (CARDIZEM CD) 180 MG 24 hr capsule Take 1 capsule (180 mg total) by mouth daily.     Allergies: No Known  Allergies  Social History: The patient  reports that she has never smoked. She has never used smokeless tobacco. She reports current alcohol use. She reports that she does not use drugs.   Family History: The patient's family history includes Alzheimer's disease in her sister; Brain cancer in her brother; Breast cancer in her sister and sister; Cancer in her maternal grandmother; Cirrhosis in her sister; Diabetes in her father, paternal grandfather, and another family member; Heart attack in her brother, brother, and father; Heart disease in her brother; Kidney cancer in her mother; Other in her brother and brother; Ovarian cancer in her sister; Stroke in her sister; Ulcerative colitis in her son.   Review of Systems: Please see the history of present illness.   All other systems are reviewed and negative.   Physical Exam: VS:  BP (!) 142/24   Pulse (!) 101   Ht 5\' 7"  (1.702 m)   Wt 157 lb (71.2 kg)   SpO2 98%   BMI 24.59 kg/m  .  BMI Body mass index is 24.59 kg/m.  Wt Readings from Last 3 Encounters:  01/13/20 157 lb (71.2 kg)  12/27/19 162 lb (73.5 kg)  12/03/19 160 lb 12.8 oz (72.9 kg)    General: Pleasant. Alert and in no acute distress. She looks much younger than her stated age. Very suntanned.  Cardiac: Irregular irregular rhythm. Her rate is faster on my exam - around 110 - No murmur. No edema.  Respiratory:  Lungs are clear to auscultation bilaterally with normal work of breathing.  GI: Soft and nontender.  MS: No deformity or atrophy. Gait and ROM intact.  Skin: Warm and dry. Color is normal.  Neuro:  Strength and sensation are intact and no gross focal deficits noted.  Psych: Alert, appropriate and with normal affect.   LABORATORY DATA:  EKG:  EKG is ordered today.  This is personally reviewed by me. This demonstrates AF with VR of 93.   Lab Results  Component Value Date   WBC 4.4 12/17/2019   HGB 11.4 (L) 12/17/2019   HCT 33.6 (L) 12/17/2019   PLT 258.0  12/17/2019   GLUCOSE 97 12/17/2019   CHOL 186 03/26/2019   TRIG 146.0 03/26/2019   HDL 56.70 03/26/2019   LDLDIRECT 97.0 09/10/2015   LDLCALC 100 (H) 03/26/2019   ALT 15 03/26/2019   AST 17 03/26/2019   NA 138 12/17/2019   K 4.2 12/17/2019   CL 99 12/17/2019   CREATININE 1.06 12/17/2019   BUN 19 12/17/2019   CO2 30 12/17/2019   TSH 5.17 (H) 12/17/2019   MICROALBUR 0.7 03/26/2019  BNP (last 3 results) No results for input(s): BNP in the last 8760 hours.  ProBNP (last 3 results) No results for input(s): PROBNP in the last 8760 hours.   Other Studies Reviewed Today:   ECHO IMPRESSIONS 05/2019  1. Left ventricular ejection fraction, by visual estimation, is 60 to  65%. The left ventricle has normal function. There is mildly increased  left ventricular hypertrophy.  2. Left ventricular diastolic parameters are indeterminate.  3. Global right ventricle has normal systolic function.The right  ventricular size is normal. No increase in right ventricular wall  thickness.  4. Left atrial size was normal.  5. Right atrial size was mild-moderately dilated.  6. Mild mitral annular calcification.  7. The mitral valve is abnormal. Mild mitral valve regurgitation. No  evidence of mitral stenosis.  8. The tricuspid valve is normal in structure. Tricuspid valve  regurgitation mild-moderate.  9. The aortic valve is abnormal. Aortic valve regurgitation is trivial.  Mild aortic valve stenosis. Moderate reduced leaflet excursion.  10. Aortic valve mean gradient measures 10.3 mmHg.  11. The pulmonic valve was normal in structure. Pulmonic valve  regurgitation is trivial.  12. Normal pulmonary artery systolic pressure.  13. The inferior vena cava is normal in size with greater than 50%  respiratory variability, suggesting right atrial pressure of 3 mmHg.  14. The left ventricle has no regional wall motion abnormalities.   ASSESSMENT & PLAN:   1. Persistent AF - still  with not ideal rate control - will increase Diltiazem further to 240 mg a day. Restarting Eliquis today as well. She is not symptomatic at all - we discussed options of repeat cardioversion/possible AAD therapy versus just rate control - she is leaning towards just rate control - would need 4 weeks of anticoagulation prior to cardioversion.   2. Prior GI bleed - was rather profound and very acute onset - HGB was down to 5.5 - required transfusion. She has had repeat EGD with good report. Remains on PPI. Rechecking CBC today.   3. HTN - BP just fair - CCB is increased today.   4. Mild AS - no cardinal symptoms - would follow.   5. Prior chronic anticoagulation - rechecking her lab today. Eliquis will be restarted at 5 mg BID.   6. HLD - not discussed today.   7. Hypothyroidism - has had dose adjusted by PCP.   Current medicines are reviewed with the patient today.  The patient does not have concerns regarding medicines other than what has been noted above.  The following changes have been made:  See above.  Labs/ tests ordered today include:    Orders Placed This Encounter  Procedures  . Basic metabolic panel  . CBC  . EKG 12-Lead     Disposition:   FU with [place] in a month with repeat EKG - will need labs followed closely. To discuss further disposition at that time.    Patient is agreeable to this plan and will call if any problems develop in the interim.   SignedTruitt Merle, NP  01/13/2020 11:17 AM  [place] [address] [place], [place]  [address] Phone: [phone] Fax: [phone]

## 2019-12-31 NOTE — Telephone Encounter (Signed)
  Follow up Call-  Call back number 12/27/2019  Post procedure Call Back phone  # [phone]  Permission to leave phone message Yes  Some recent data might be hidden     Patient questions:  Do you have a fever, pain , or abdominal swelling? No. Pain Score  0 *  Have you tolerated food without any problems? Yes.    Have you been able to return to your normal activities? Yes.    Do you have any questions about your discharge instructions: Diet   No. Medications  No. Follow up visit  No.  Do you have questions or concerns about your Care? No.  Actions: * If pain score is 4 or above: No action needed, pain <4.  1. Have you developed a fever since your procedure? no  2.   Have you had an respiratory symptoms (SOB or cough) since your procedure? no  3.   Have you tested positive for COVID 19 since your procedure no  4.   Have you had any family members/close contacts diagnosed with the COVID 19 since your procedure?  no   If yes to any of these questions please route to Joylene John, RN and Erenest Rasher, RN

## 2019-12-31 NOTE — Telephone Encounter (Signed)
  Follow up Call-  Call back number 12/27/2019  Post procedure Call Back phone  # [phone]  Permission to leave phone message Yes  Some recent data might be hidden     Patient questions:  Do you have a fever, pain , or abdominal swelling? No. Pain Score  0 *  Have you tolerated food without any problems? Yes.    Have you been able to return to your normal activities? Yes.    Do you have any questions about your discharge instructions: Diet   No. Medications  No. Follow up visit  No.  Do you have questions or concerns about your Care? No.  Actions: * If pain score is 4 or above: No action needed, pain <4.  1. Have you developed a fever since your procedure? no  2.   Have you had an respiratory symptoms (SOB or cough) since your procedure? no  3.   Have you tested positive for COVID 19 since your procedure no  4.   Have you had any family members/close contacts diagnosed with the COVID 19 since your procedure?  no   If yes to any of these questions please route to Joylene John, RN and Erenest Rasher, RN

## 2020-01-02 ENCOUNTER — Encounter: Payer: Self-pay | Admitting: Internal Medicine

## 2020-01-05 HISTORY — PX: ESOPHAGOGASTRODUODENOSCOPY: SHX1529

## 2020-01-08 ENCOUNTER — Encounter: Payer: Self-pay | Admitting: Family Medicine

## 2020-01-13 ENCOUNTER — Encounter: Payer: Self-pay | Admitting: Nurse Practitioner

## 2020-01-13 ENCOUNTER — Ambulatory Visit: Payer: Medicare Other | Admitting: Nurse Practitioner

## 2020-01-13 ENCOUNTER — Other Ambulatory Visit: Payer: Self-pay

## 2020-01-13 VITALS — BP 142/24 | HR 101 | Ht 67.0 in | Wt 157.0 lb

## 2020-01-13 DIAGNOSIS — Z79899 Other long term (current) drug therapy: Secondary | ICD-10-CM | POA: Diagnosis not present

## 2020-01-13 DIAGNOSIS — I480 Paroxysmal atrial fibrillation: Secondary | ICD-10-CM

## 2020-01-13 DIAGNOSIS — K922 Gastrointestinal hemorrhage, unspecified: Secondary | ICD-10-CM | POA: Diagnosis not present

## 2020-01-13 DIAGNOSIS — I350 Nonrheumatic aortic (valve) stenosis: Secondary | ICD-10-CM

## 2020-01-13 DIAGNOSIS — I10 Essential (primary) hypertension: Secondary | ICD-10-CM | POA: Diagnosis not present

## 2020-01-13 DIAGNOSIS — I4819 Other persistent atrial fibrillation: Secondary | ICD-10-CM

## 2020-01-13 LAB — BASIC METABOLIC PANEL
BUN/Creatinine Ratio: 20 (ref 12–28)
BUN: 26 mg/dL (ref 8–27)
CO2: 27 mmol/L (ref 20–29)
Calcium: 9.7 mg/dL (ref 8.7–10.3)
Chloride: 98 mmol/L (ref 96–106)
Creatinine, Ser: 1.29 mg/dL — ABNORMAL HIGH (ref 0.57–1.00)
GFR calc Af Amer: 44 mL/min/{1.73_m2} — ABNORMAL LOW (ref >59)
GFR calc non Af Amer: 38 mL/min/{1.73_m2} — ABNORMAL LOW (ref >59)
Glucose: 86 mg/dL (ref 65–99)
Potassium: 4.5 mmol/L (ref 3.5–5.2)
Sodium: 138 mmol/L (ref 134–144)

## 2020-01-13 LAB — CBC
Hematocrit: 35 % (ref 34.0–46.6)
Hemoglobin: 12 g/dL (ref 11.1–15.9)
MCH: 33.1 pg — ABNORMAL HIGH (ref 26.6–33.0)
MCHC: 34.3 g/dL (ref 31.5–35.7)
MCV: 97 fL (ref 79–97)
Platelets: 276 10*3/uL (ref 150–450)
RBC: 3.62 x10E6/uL — ABNORMAL LOW (ref 3.77–5.28)
RDW: 12.7 % (ref 11.7–15.4)
WBC: 5.2 10*3/uL (ref 3.4–10.8)

## 2020-01-13 MED ORDER — APIXABAN 5 MG PO TABS: 5.0000 mg | ORAL_TABLET | Freq: Two times a day (BID) | ORAL | Status: DC

## 2020-01-13 MED ORDER — DILTIAZEM HCL ER COATED BEADS 240 MG PO CP24
240.0000 mg | ORAL_CAPSULE | Freq: Every day | ORAL | 3 refills | Status: DC
Start: 2020-01-13 — End: 2021-01-07

## 2020-01-13 NOTE — Patient Instructions (Addendum)
After Visit Summary:  We will be checking the following labs today - BMET and CBC   Medication Instructions:    Continue with your current medicines.   We will restart the Eliquis 5 mg twice a day - you should have this at home  I am increasing the Cardizem to 240 mg a day - you can take 2 of the 120 mg capsules to equal this dose and use those up - I have sent the new RX for the 240 mg to your pharmacy.     If you need a refill on your cardiac medications before your next appointment, please call your pharmacy.     Testing/Procedures To Be Arranged:  N/A  Follow-Up:   See Dr. Marlou Porch in about a month with EKG and to decide about repeat cardioversion versus just staying in atrial fibrillation.     At [facility], you and your health needs are our priority.  As part of our continuing mission to provide you with exceptional heart care, we have created designated Provider Care Teams.  These Care Teams include your primary Cardiologist (physician) and Advanced Practice Providers (APPs -  Physician Assistants and Nurse Practitioners) who all work together to provide you with the care you need, when you need it.  Special Instructions:  . Stay safe, wash your hands for at least 20 seconds and wear a mask when needed.  . It was good to talk with you today.    Call the [place] office at [phone] if you have any questions, problems or concerns.

## 2020-02-13 ENCOUNTER — Ambulatory Visit: Payer: Medicare Other | Admitting: Cardiology

## 2020-02-13 ENCOUNTER — Other Ambulatory Visit: Payer: Self-pay

## 2020-02-13 ENCOUNTER — Encounter: Payer: Self-pay | Admitting: Cardiology

## 2020-02-13 VITALS — BP 130/80 | HR 86 | Ht 67.0 in | Wt 156.0 lb

## 2020-02-13 DIAGNOSIS — I10 Essential (primary) hypertension: Secondary | ICD-10-CM

## 2020-02-13 DIAGNOSIS — Z79899 Other long term (current) drug therapy: Secondary | ICD-10-CM | POA: Diagnosis not present

## 2020-02-13 DIAGNOSIS — K922 Gastrointestinal hemorrhage, unspecified: Secondary | ICD-10-CM | POA: Diagnosis not present

## 2020-02-13 DIAGNOSIS — I480 Paroxysmal atrial fibrillation: Secondary | ICD-10-CM

## 2020-02-13 NOTE — Progress Notes (Signed)
Cardiology Office Note:    Date:  02/13/2020   ID:  Judy Walker, DOB 28-Apr-1933, MRN [id number]  PCP:  Judy Bush, MD  [facility] Cardiologist:  [name], MD  [facility] Electrophysiologist:  None   Referring MD: Judy Bush, MD    History of Present Illness:    Judy Walker is a 86 y.o. female here for atrial fibrillation follow-up.  She had cardioversion 1/21.  She is back in atrial fibrillation today.  Has had GI bleed.  EGD has been performed.  Past Medical History:  Diagnosis Date   Anemia    Atrial fibrillation ([place])    Benign positional vertigo 2005   Colonoscopy refused    " I elected not to have one" [place] reviewed 07/06/10; she continues to decline colonscopy   Heart murmur    HLD (hyperlipidemia)    total cholesterol 340 in 2003, NHDL 61, TG 75. NMR Lipoprofile 2005: LDL 84 (933/285), LDL glad =<120 ideally <90. Framingham study LDL goal <130   HTN (hypertension)    Hypothyroidism    Osteopenia 08/2012, 09/2015   DEXA -1.9 (2014) -2.3 (2017)    Past Surgical History:  Procedure Laterality Date   ABDOMINAL HYSTERECTOMY  2003   & BSO for cystic ovaries . No PMH of abnormal PAP   CARDIAC CATHETERIZATION     normal   CARDIOVERSION N/A 06/27/2019   Procedure: CARDIOVERSION;  Surgeon: Judy Pain, MD;  Location: [place];  Service: Cardiovascular;  Laterality: N/A;   CATARACT EXTRACTION, BILATERAL  09/2011   Dr.[name]    CYSTOSCOPY     bladder @ age 38 for painful hematuria   ESOPHAGOGASTRODUODENOSCOPY  12/2019   prepyloric scarring, 4cm HH, biopsies with healing ulcer, neg H pylori Judy Walker)    Current Medications: Current Meds  Medication Sig   apixaban (ELIQUIS) 5 MG TABS tablet Take 1 tablet (5 mg total) by mouth 2 (two) times daily.   Ascorbic Acid (VITAMIN C GUMMIE PO) Take 250 mg by mouth daily.    atorvastatin (LIPITOR) 10 MG tablet TAKE 1 TABLET BY MOUTH EVERY DAY   Calcium Carbonate  (CALCIUM 500 PO) Take 500 mg by mouth daily.   Cholecalciferol (VITAMIN D3) 25 MCG (1000 UT) CAPS Take 1 capsule (1,000 Units total) by mouth daily.   diltiazem (CARDIZEM CD) 240 MG 24 hr capsule Take 1 capsule (240 mg total) by mouth daily.   diphenhydrAMINE (BENADRYL) 25 mg capsule Take 25 mg by mouth at bedtime.    hydrochlorothiazide (HYDRODIURIL) 25 MG tablet TAKE 1 TABLET BY MOUTH EVERY DAY   levothyroxine (SYNTHROID) 75 MCG tablet Take 1 tablet (75 mcg total) by mouth daily. One day a week take 1.5 tablets (117.18mcg)   losartan (COZAAR) 100 MG tablet TAKE 1 TABLET BY MOUTH EVERY DAY   Multiple Vitamins-Minerals (MULTIVITAMIN ADULT) TABS Take 1 tablet by mouth daily.   pantoprazole (PROTONIX) 40 MG tablet Take 40 mg by mouth daily.   potassium gluconate 595 (99 K) MG TABS tablet Take 595 mg by mouth daily.     Allergies:   Patient has no known allergies.   Social History   Socioeconomic History   Marital status: Widowed    Spouse name: Not on file   Number of children: 2   Years of education: Not on file   Highest education level: Not on file  Occupational History   Occupation: retired  Tobacco Use   Smoking status: Never Smoker   Smokeless tobacco: Never Used  Vaping Use   Vaping Use: Never used  Substance and Sexual Activity   Alcohol use: Yes    Comment:  RARELY   Drug use: No   Sexual activity: Not on file  Other Topics Concern   Not on file  Social History Narrative   Lives alone. Widow 2012   Daughter lives nearby.   Occupation: retired Clinical cytogeneticist   Was Programmer, multimedia growing up   Activity: Regular exercise - walking 1-2 miles daily, stays active in yard.   Diet: daily fruits/vegetables, some water   Social Determinants of Health   Financial Resource Strain: Low Risk    Difficulty of Paying Living Expenses: Not hard at all  Food Insecurity: No Food Insecurity   Worried About Charity fundraiser in the Last Year: Never true     Ran Out of Food in the Last Year: Never true  Transportation Needs: No Transportation Needs   Lack of Transportation (Medical): No   Lack of Transportation (Non-Medical): No  Physical Activity: Inactive   Days of Exercise per Week: 0 days   Minutes of Exercise per Session: 0 min  Stress: No Stress Concern Present   Feeling of Stress : Not at all  Social Connections:    Frequency of Communication with Friends and Family: Not on file   Frequency of Social Gatherings with Friends and Family: Not on file   Attends Religious Services: Not on file   Active Member of Clubs or Organizations: Not on file   Attends Archivist Meetings: Not on file   Marital Status: Not on file     Family History: The patient's family history includes Alzheimer's disease in her sister; Brain cancer in her brother; Breast cancer in her sister and sister; Cancer in her maternal grandmother; Cirrhosis in her sister; Diabetes in her father, paternal grandfather, and another family member; Heart attack in her brother, brother, and father; Heart disease in her brother; Kidney cancer in her mother; Other in her brother and brother; Ovarian cancer in her sister; Stroke in her sister; Ulcerative colitis in her son.  ROS:   Please see the history of present illness.     All other systems reviewed and are negative.  EKGs/Labs/Other Studies Reviewed:    EKG:  EKG is  ordered today.  The ekg ordered today demonstrates atrial fibrillation heart rate 86 bpm better rate control when compared to prior.  Recent Labs: 03/26/2019: ALT 15 12/17/2019: TSH 5.17 01/13/2020: BUN 26; Creatinine, Ser 1.29; Hemoglobin 12.0; Platelets 276; Potassium 4.5; Sodium 138  Recent Lipid Panel    Component Value Date/Time   CHOL 186 03/26/2019 1203   TRIG 146.0 03/26/2019 1203   TRIG 63 05/17/2006 1034   HDL 56.70 03/26/2019 1203   CHOLHDL 3 03/26/2019 1203   VLDL 29.2 03/26/2019 1203   LDLCALC 100 (H) 03/26/2019 1203    LDLDIRECT 97.0 09/10/2015 1158    Physical Exam:    VS:  BP 130/80    Pulse 86    Ht 5\' 7"  (1.702 m)    Wt 156 lb (70.8 kg)    SpO2 99%    BMI 24.43 kg/m     Wt Readings from Last 3 Encounters:  02/13/20 156 lb (70.8 kg)  01/13/20 157 lb (71.2 kg)  12/27/19 162 lb (73.5 kg)     GEN:  Well nourished, well developed in no acute distress HEENT: Normal NECK: No JVD; No carotid bruits LYMPHATICS: No lymphadenopathy CARDIAC: irreg, 2/6 SM,  no rubs, gallops RESPIRATORY:  Clear to auscultation without rales, wheezing or rhonchi  ABDOMEN: Soft, non-tender, non-distended MUSCULOSKELETAL:  No edema; No deformity  SKIN: Warm and dry NEUROLOGIC:  Alert and oriented x 3 PSYCHIATRIC:  Normal affect   ASSESSMENT:    1. Paroxysmal atrial fibrillation (HCC)   2. Medication management   3. Essential hypertension   4. Gastrointestinal hemorrhage, unspecified gastrointestinal hemorrhage type    PLAN:    In order of problems listed above:  Persistent atrial fibrillation -Persistent atrial fibrillation -Under good rate control currently.  Diltiazem was increased at a prior visit with Truitt Merle, NP.  Excellent. -No changes.  We will not pursue rhythm control strategy.  She is asymptomatic.  Chronic anticoagulation -Restarted Eliquis.  On 01/13/2020.  We will recheck a CBC today.  Prior GI bleed -Hemoglobin 5.5, transfusion.  EGD report reviewed, ulcers have healed.  She does have a hiatal hernia.  Her Protonix has been cut back.  Continue per GI.  She did not take NSAIDs or heavy alcohol use.  Mild aortic stenosis -Murmur heard.  No other symptoms.  Checking a basic metabolic profile today.   Medication Adjustments/Labs and Tests Ordered: Current medicines are reviewed at length with the patient today.  Concerns regarding medicines are outlined above.  Orders Placed This Encounter  Procedures   CBC   Basic metabolic panel   EKG 78-HYIF   No orders of the defined types  were placed in this encounter.   Patient Instructions  Medication Instructions:  The current medical regimen is effective;  continue present plan and medications.  *If you need a refill on your cardiac medications before your next appointment, please call your pharmacy*  Lab Work: Please have blood work today (CBC,BMP) If you have labs (blood work) drawn today and your tests are completely normal, you will receive your results only by:  MyChart Message (if you have MyChart) OR  A paper copy in the mail If you have any lab test that is abnormal or we need to change your treatment, we will call you to review the results.  Follow-Up: At [facility] you and your health needs are our priority.  As part of our continuing mission to provide you with exceptional heart care, we have created designated Provider Care Teams.  These Care Teams include your primary Cardiologist (physician) and Advanced Practice Providers (APPs -  Physician Assistants and Nurse Practitioners) who all work together to provide you with the care you need, when you need it.  We recommend signing up for the patient portal called "MyChart".  Sign up information is provided on this After Visit Summary.  MyChart is used to connect with patients for Virtual Visits (Telemedicine).  Patients are able to view lab/test results, encounter notes, upcoming appointments, etc.  Non-urgent messages can be sent to your provider as well.   To learn more about what you can do with MyChart, go to [email or web address].    Your next appointment:   4 month(s)  The format for your next appointment:   In Person  Provider:   Truitt Merle, NP   Thank you for choosing [facility]!!        Signed, [name], MD  02/13/2020 3:21 PM    [place]

## 2020-02-13 NOTE — Patient Instructions (Signed)
Medication Instructions:  The current medical regimen is effective;  continue present plan and medications.  *If you need a refill on your cardiac medications before your next appointment, please call your pharmacy*  Lab Work: Please have blood work today (CBC,BMP) If you have labs (blood work) drawn today and your tests are completely normal, you will receive your results only by: Marland Kitchen MyChart Message (if you have MyChart) OR . A paper copy in the mail If you have any lab test that is abnormal or we need to change your treatment, we will call you to review the results.  Follow-Up: At [facility], you and your health needs are our priority.  As part of our continuing mission to provide you with exceptional heart care, we have created designated Provider Care Teams.  These Care Teams include your primary Cardiologist (physician) and Advanced Practice Providers (APPs -  Physician Assistants and Nurse Practitioners) who all work together to provide you with the care you need, when you need it.  We recommend signing up for the patient portal called "MyChart".  Sign up information is provided on this After Visit Summary.  MyChart is used to connect with patients for Virtual Visits (Telemedicine).  Patients are able to view lab/test results, encounter notes, upcoming appointments, etc.  Non-urgent messages can be sent to your provider as well.   To learn more about what you can do with MyChart, go to [email or web address].    Your next appointment:   4 month(s)  The format for your next appointment:   In Person  Provider:   Truitt Merle, NP   Thank you for choosing [facility]!!

## 2020-02-14 LAB — CBC
Hematocrit: 37.8 % (ref 34.0–46.6)
Hemoglobin: 12.9 g/dL (ref 11.1–15.9)
MCH: 33.2 pg — ABNORMAL HIGH (ref 26.6–33.0)
MCHC: 34.1 g/dL (ref 31.5–35.7)
MCV: 97 fL (ref 79–97)
Platelets: 266 10*3/uL (ref 150–450)
RBC: 3.88 x10E6/uL (ref 3.77–5.28)
RDW: 12.6 % (ref 11.7–15.4)
WBC: 6 10*3/uL (ref 3.4–10.8)

## 2020-02-14 LAB — BASIC METABOLIC PANEL
BUN/Creatinine Ratio: 25 (ref 12–28)
BUN: 27 mg/dL (ref 8–27)
CO2: 25 mmol/L (ref 20–29)
Calcium: 9.6 mg/dL (ref 8.7–10.3)
Chloride: 102 mmol/L (ref 96–106)
Creatinine, Ser: 1.08 mg/dL — ABNORMAL HIGH (ref 0.57–1.00)
GFR calc Af Amer: 54 mL/min/{1.73_m2} — ABNORMAL LOW (ref >59)
GFR calc non Af Amer: 47 mL/min/{1.73_m2} — ABNORMAL LOW (ref >59)
Glucose: 91 mg/dL (ref 65–99)
Potassium: 3.8 mmol/L (ref 3.5–5.2)
Sodium: 143 mmol/L (ref 134–144)

## 2020-02-17 ENCOUNTER — Other Ambulatory Visit: Payer: Self-pay | Admitting: Physician Assistant

## 2020-02-25 ENCOUNTER — Ambulatory Visit: Payer: Medicare Other | Admitting: Cardiology

## 2020-03-27 ENCOUNTER — Encounter: Payer: Medicare Other | Admitting: Family Medicine

## 2020-05-12 ENCOUNTER — Other Ambulatory Visit: Payer: Self-pay

## 2020-05-12 ENCOUNTER — Other Ambulatory Visit: Payer: Self-pay | Admitting: Family Medicine

## 2020-05-12 ENCOUNTER — Ambulatory Visit (INDEPENDENT_AMBULATORY_CARE_PROVIDER_SITE_OTHER): Payer: Medicare Other

## 2020-05-12 DIAGNOSIS — Z0000 Encounter for general adult medical examination without abnormal findings: Secondary | ICD-10-CM

## 2020-05-12 NOTE — Progress Notes (Signed)
PCP notes:  Health Maintenance: No gaps noted    Abnormal Screenings: none   Patient concerns: Bilateral edema in feet   Needs a handicap placard form    Nurse concerns: none   Next PCP appt.: 05/15/2020 @ 11:30 am

## 2020-05-12 NOTE — Progress Notes (Signed)
Subjective:   Judy Walker is a 86 y.o. female who presents for Medicare Annual (Subsequent) preventive examination.  Review of Systems: N/A      I connected with the patient today by telephone and verified that I am speaking with the correct person using two identifiers. Location patient: home Location nurse: work Persons participating in the telephone visit: patient, nurse.   I discussed the limitations, risks, security and privacy concerns of performing an evaluation and management service by telephone and the availability of in person appointments. I also discussed with the patient that there may be a patient responsible charge related to this service. The patient expressed understanding and verbally consented to this telephonic visit.        Cardiac Risk Factors include: advanced age (>48men, >41 women);hypertension;Other (see comment), Risk factor comments: hyperlipidemia     Objective:    Today's Vitals   There is no height or weight on file to calculate BMI.  Advanced Directives 05/12/2020 06/27/2019 03/22/2019 09/26/2017 09/22/2016  Does Patient Have a Medical Advance Directive? Yes Yes Yes Yes Yes  Type of Paramedic of [place];Living will Living will;Healthcare Power of [place];Living will [place];Living will [place];Living will  Copy of [place] in Chart? Yes - validated most recent copy scanned in chart (See row information) No - copy requested Yes - validated most recent copy scanned in chart (See row information) Yes No - copy requested    Current Medications (verified) Outpatient Encounter Medications as of 05/12/2020  Medication Sig  . apixaban (ELIQUIS) 5 MG TABS tablet Take 1 tablet (5 mg total) by mouth 2 (two) times daily.  . Ascorbic Acid (VITAMIN C GUMMIE PO) Take 250 mg by mouth daily.   Marland Kitchen atorvastatin (LIPITOR) 10 MG tablet TAKE 1 TABLET  BY MOUTH EVERY DAY  . Calcium Carbonate (CALCIUM 500 PO) Take 500 mg by mouth daily.  . Cholecalciferol (VITAMIN D3) 25 MCG (1000 UT) CAPS Take 1 capsule (1,000 Units total) by mouth daily.  . diphenhydrAMINE (BENADRYL) 25 mg capsule Take 25 mg by mouth at bedtime.   . hydrochlorothiazide (HYDRODIURIL) 25 MG tablet TAKE 1 TABLET BY MOUTH EVERY DAY  . levothyroxine (SYNTHROID) 75 MCG tablet Take 1 tablet (75 mcg total) by mouth daily. One day a week take 1.5 tablets (117.53mcg)  . losartan (COZAAR) 100 MG tablet TAKE 1 TABLET BY MOUTH EVERY DAY  . Multiple Vitamins-Minerals (MULTIVITAMIN ADULT) TABS Take 1 tablet by mouth daily.  . pantoprazole (PROTONIX) 40 MG tablet Take 1 tablet (40 mg total) by mouth daily.  . potassium gluconate 595 (99 K) MG TABS tablet Take 595 mg by mouth daily.  Marland Kitchen diltiazem (CARDIZEM CD) 240 MG 24 hr capsule Take 1 capsule (240 mg total) by mouth daily.   No facility-administered encounter medications on file as of 05/12/2020.    Allergies (verified) Patient has no known allergies.   History: Past Medical History:  Diagnosis Date  . Anemia   . Atrial fibrillation ([place])   . Benign positional vertigo 2005  . Colonoscopy refused    " I elected not to have one" [place] reviewed 07/06/10; she continues to decline colonscopy  . Heart murmur   . HLD (hyperlipidemia)    total cholesterol 340 in 2003, NHDL 61, TG 75. NMR Lipoprofile 2005: LDL 84 (933/285), LDL glad =<120 ideally <90. Framingham study LDL goal <130  . HTN (hypertension)   . Hypothyroidism   .  Osteopenia 08/2012, 09/2015   DEXA -1.9 (2014) -2.3 (2017)   Past Surgical History:  Procedure Laterality Date  . ABDOMINAL HYSTERECTOMY  2003   & BSO for cystic ovaries . No PMH of abnormal PAP  . CARDIAC CATHETERIZATION     normal  . CARDIOVERSION N/A 06/27/2019   Procedure: CARDIOVERSION;  Surgeon: Jerline Pain, MD;  Location: [facility];  Service: Cardiovascular;  Laterality: N/A;  . CATARACT EXTRACTION,  BILATERAL  09/2011   Dr.[name]   . CYSTOSCOPY     bladder @ age 85 for painful hematuria  . ESOPHAGOGASTRODUODENOSCOPY  12/2019   prepyloric scarring, 4cm HH, biopsies with healing ulcer, neg H pylori Carlean Purl)   Family History  Problem Relation Age of Onset  . Diabetes Father   . Heart attack Father        in 56s  . Kidney cancer Mother   . Breast cancer Sister   . Stroke Sister   . Cirrhosis Sister        veins came appart and had to be glued back together  . Breast cancer Sister        mets  . Diabetes Other         4 brothers and  3 sisters  . Ovarian cancer Sister   . Heart attack Brother        1 pre 55  . Alzheimer's disease Sister   . Brain cancer Brother   . Cancer Maternal Grandmother        unknown type  . Diabetes Paternal Grandfather   . Heart attack Brother   . Other Brother        died at birth  . Other Brother        died at birth  . Heart disease Brother   . Ulcerative colitis Son    Social History   Socioeconomic History  . Marital status: Widowed    Spouse name: Not on file  . Number of children: 2  . Years of education: Not on file  . Highest education level: Not on file  Occupational History  . Occupation: retired  Tobacco Use  . Smoking status: Never Smoker  . Smokeless tobacco: Never Used  Vaping Use  . Vaping Use: Never used  Substance and Sexual Activity  . Alcohol use: Yes    Comment:  RARELY  . Drug use: No  . Sexual activity: Not on file  Other Topics Concern  . Not on file  Social History Narrative   Lives alone. Widow 2012   Daughter lives nearby.   Occupation: retired Clinical cytogeneticist   Was Programmer, multimedia growing up   Activity: Regular exercise - walking 1-2 miles daily, stays active in yard.   Diet: daily fruits/vegetables, some water   Social Determinants of Health   Financial Resource Strain: Low Risk   . Difficulty of Paying Living Expenses: Not hard at all  Food Insecurity: No Food Insecurity  .  Worried About Charity fundraiser in the Last Year: Never true  . Ran Out of Food in the Last Year: Never true  Transportation Needs: No Transportation Needs  . Lack of Transportation (Medical): No  . Lack of Transportation (Non-Medical): No  Physical Activity: Insufficiently Active  . Days of Exercise per Week: 7 days  . Minutes of Exercise per Session: 10 min  Stress: No Stress Concern Present  . Feeling of Stress : Not at all  Social Connections:   . Frequency of Communication  with Friends and Family: Not on file  . Frequency of Social Gatherings with Friends and Family: Not on file  . Attends Religious Services: Not on file  . Active Member of Clubs or Organizations: Not on file  . Attends Archivist Meetings: Not on file  . Marital Status: Not on file    Tobacco Counseling Counseling given: Not Answered   Clinical Intake:  Pre-visit preparation completed: Yes  Pain : No/denies pain     Nutritional Risks: None Diabetes: No  How often do you need to have someone help you when you read instructions, pamphlets, or other written materials from your doctor or pharmacy?: 1 - Never What is the last grade level you completed in school?: 12th  Diabetic: No Nutrition Risk Assessment:  Has the patient had any N/V/D within the last 2 months?  No  Does the patient have any non-healing wounds?  No  Has the patient had any unintentional weight loss or weight gain?  No   Diabetes:  Is the patient diabetic?  No  If diabetic, was a CBG obtained today?  N/A Did the patient bring in their glucometer from home?  N/A How often do you monitor your CBG's? N/A.   Financial Strains and Diabetes Management:  Are you having any financial strains with the device, your supplies or your medication? N/A.  Does the patient want to be seen by Chronic Care Management for management of their diabetes?  N/A Would the patient like to be referred to a Nutritionist or for Diabetic  Management?  N/A   Interpreter Needed?: No  Information entered by :: CJohnson, LPN   Activities of Daily Living In your present state of health, do you have any difficulty performing the following activities: 05/12/2020  Hearing? N  Vision? N  Difficulty concentrating or making decisions? N  Walking or climbing stairs? N  Dressing or bathing? N  Doing errands, shopping? N  Preparing Food and eating ? N  Using the Toilet? N  In the past six months, have you accidently leaked urine? N  Do you have problems with loss of bowel control? N  Managing your Medications? N  Managing your Finances? N  Housekeeping or managing your Housekeeping? N  Some recent data might be hidden    Patient Care Team: Ria Bush, MD as PCP - General (Family Medicine) Jerline Pain, MD as PCP - Cardiology (Cardiology)  Indicate any recent Medical Services you may have received from other than [facility] providers in the past year (date may be approximate).     Assessment:   This is a routine wellness examination for Elvin.  Hearing/Vision screen  Hearing Screening   125Hz  250Hz  500Hz  1000Hz  2000Hz  3000Hz  4000Hz  6000Hz  8000Hz   Right ear:           Left ear:           Vision Screening Comments: Advised patient to get annual eye exams   Dietary issues and exercise activities discussed: Current Exercise Habits: Home exercise routine, Type of exercise: treadmill, Time (Minutes): 10, Frequency (Times/Week): 7, Weekly Exercise (Minutes/Week): 70, Intensity: Moderate, Exercise limited by: None identified  Goals    . Increase physical activity     Starting 09/26/2017, I will continue to walk for 30 minutes daily.     . Patient Stated     03/22/2019, I will continue to maintain and take my medications.     . Patient Stated     05/12/2020, I will walk  on my treadmill everyday for 10 minutes.     . physical     Starting 09/22/16, I will begin walking at least 2-5 miles daily.       Depression  Screen PHQ 2/9 Scores 05/12/2020 03/22/2019 09/26/2017 09/22/2016 09/10/2015 09/04/2014 08/08/2013  PHQ - 2 Score 0 0 0 0 0 0 0  PHQ- 9 Score 0 0 0 - - - -    Fall Risk Fall Risk  05/12/2020 11/18/2019 03/22/2019 09/26/2017 09/22/2016  Falls in the past year? 0 1 0 No No  Number falls in past yr: 0 0 - - -  Injury with Fall? 0 1 - - -  Risk for fall due to : Medication side effect History of fall(s) Medication side effect - -  Follow up Falls evaluation completed;Falls prevention discussed - Falls evaluation completed;Falls prevention discussed - -    FALL RISK PREVENTION PERTAINING TO THE HOME:  Any stairs in or around the home? Yes  If so, are there any without handrails? No  Home free of loose throw rugs in walkways, pet beds, electrical cords, etc? Yes  Adequate lighting in your home to reduce risk of falls? Yes   ASSISTIVE DEVICES UTILIZED TO PREVENT FALLS:  Life alert? No  Use of a cane, walker or w/c? No  Grab bars in the bathroom? No  Shower chair or bench in shower? No  Elevated toilet seat or a handicapped toilet? No   TIMED UP AND GO:  Was the test performed? N/A, telephone visit.    Cognitive Function: MMSE - Mini Mental State Exam 05/12/2020 03/22/2019 09/26/2017 09/22/2016  Orientation to time 5 5 5 5   Orientation to Place 5 5 5 5   Registration 3 3 3 3   Attention/ Calculation 5 5 0 0  Recall 3 3 2 3   Recall-comments - - unable to recall 1 of 3 words -  Language- name 2 objects - - 0 0  Language- repeat 1 1 1 1   Language- follow 3 step command - - 3 3  Language- read & follow direction - - 0 0  Write a sentence - - 0 0  Copy design - - 0 0  Total score - - 19 20  Mini Cog  Mini-Cog screen was completed. Maximum score is 22. A value of 0 denotes this part of the MMSE was not completed or the patient failed this part of the Mini-Cog screening.       Immunizations Immunization History  Administered Date(s) Administered  . Fluad Quad(high Dose 65+) 03/26/2019,  04/12/2020  . Influenza Nasal 03/07/2015  . Influenza Whole 04/06/2007, 03/04/2008, 06/25/2009  . Influenza, High Dose Seasonal PF 04/01/2018  . Influenza-Unspecified 03/06/2012, 03/06/2013, 03/06/2014, 03/06/2016  . PFIZER SARS-COV-2 Vaccination 07/11/2019, 08/05/2019, 03/15/2020  . Pneumococcal Conjugate-13 08/08/2013  . Pneumococcal Polysaccharide-23 05/11/2005  . Td 10/28/1999, 07/06/2010  . Zoster 09/04/2013    TDAP status: Up to date  Flu Vaccine status: Up to date  Pneumococcal vaccine status: Up to date  Covid-19 vaccine status: Completed vaccines  Qualifies for Shingles Vaccine? Yes   Zostavax completed Yes   Shingrix Completed?: No.    Education has been provided regarding the importance of this vaccine. Patient has been advised to call insurance company to determine out of pocket expense if they have not yet received this vaccine. Advised may also receive vaccine at local pharmacy or Health Dept. Verbalized acceptance and understanding.  Screening Tests Health Maintenance  Topic Date Due  . TETANUS/TDAP  07/06/2020  . MAMMOGRAM  03/27/2021  . INFLUENZA VACCINE  Completed  . DEXA SCAN  Completed  . COVID-19 Vaccine  Completed  . PNA vac Low Risk Adult  Completed    Health Maintenance  There are no preventive care reminders to display for this patient.  Colorectal cancer screening: No longer required.   Mammogram status: Completed 03/28/2019. Repeat every two years per patient   Bone Density status: Completed 03/26/2019. Results reflect: Bone density results: OSTEOPENIA. Repeat every 2 years.  Lung Cancer Screening: (Low Dose CT Chest recommended if Age 82-80 years, 30 pack-year currently smoking OR have quit w/in 15 years.) does not qualify.    Additional Screening:  Hepatitis C Screening: does not qualify; Completed N/A  Vision Screening: Recommended annual ophthalmology exams for early detection of glaucoma and other disorders of the eye. Is the  patient up to date with their annual eye exam?  No , can see good right now and does not feel the need to go. Who is the provider or what is the name of the office in which the patient attends annual eye exams? See above, does not have one right now  If pt is not established with a provider, would they like to be referred to a provider to establish care? No .   Dental Screening: Recommended annual dental exams for proper oral hygiene  Community Resource Referral / Chronic Care Management: CRR required this visit?  No   CCM required this visit?  No      Plan:     I have personally reviewed and noted the following in the patient's chart:   . Medical and social history . Use of alcohol, tobacco or illicit drugs  . Current medications and supplements . Functional ability and status . Nutritional status . Physical activity . Advanced directives . List of other physicians . Hospitalizations, surgeries, and ER visits in previous 12 months . Vitals . Screenings to include cognitive, depression, and falls . Referrals and appointments  In addition, I have reviewed and discussed with patient certain preventive protocols, quality metrics, and best practice recommendations. A written personalized care plan for preventive services as well as general preventive health recommendations were provided to patient.   Due to this being a telephonic visit, the after visit summary with patients personalized plan was offered to patient via office or my-chart. Patient preferred to pick up at office at next visit or via mychart.   Andrez Grime, LPN   02/05/3299

## 2020-05-12 NOTE — Patient Instructions (Signed)
Judy Walker , Thank you for taking time to come for your Medicare Wellness Visit. I appreciate your ongoing commitment to your health goals. Please review the following plan we discussed and let me know if I can assist you in the future.   Screening recommendations/referrals: Colonoscopy: no longer required Mammogram: Up to date, completed 03/28/2019. Due 03/2021, you do this every 2 years instead of yearly Bone Density: Up to date, completed 03/26/2019, due 03/2021. Recommended yearly ophthalmology/optometry visit for glaucoma screening and checkup Recommended yearly dental visit for hygiene and checkup  Vaccinations: Influenza vaccine: Up to date, completed 04/12/2020, due 01/2021 Pneumococcal vaccine: Completed series Tdap vaccine: Up to date, completed 07/06/2010, due 06/2020 Shingles vaccine: due, check with your insurance regarding coverage/cost if interested    Covid-19:Completed series  Advanced directives: copy in chart  Conditions/risks identified: hypertension, hyperlipidemia  Next appointment: Follow up in one year for your annual wellness visit    Preventive Care 3 Years and Older, Female Preventive care refers to lifestyle choices and visits with your health care provider that can promote health and wellness. What does preventive care include?  A yearly physical exam. This is also called an annual well check.  Dental exams once or twice a year.  Routine eye exams. Ask your health care provider how often you should have your eyes checked.  Personal lifestyle choices, including:  Daily care of your teeth and gums.  Regular physical activity.  Eating a healthy diet.  Avoiding tobacco and drug use.  Limiting alcohol use.  Practicing safe sex.  Taking low-dose aspirin every day.  Taking vitamin and mineral supplements as recommended by your health care provider. What happens during an annual well check? The services and screenings done by your health care  provider during your annual well check will depend on your age, overall health, lifestyle risk factors, and family history of disease. Counseling  Your health care provider may ask you questions about your:  Alcohol use.  Tobacco use.  Drug use.  Emotional well-being.  Home and relationship well-being.  Sexual activity.  Eating habits.  History of falls.  Memory and ability to understand (cognition).  Work and work Statistician.  Reproductive health. Screening  You may have the following tests or measurements:  Height, weight, and BMI.  Blood pressure.  Lipid and cholesterol levels. These may be checked every 5 years, or more frequently if you are over 69 years old.  Skin check.  Lung cancer screening. You may have this screening every year starting at age 64 if you have a 30-pack-year history of smoking and currently smoke or have quit within the past 15 years.  Fecal occult blood test (FOBT) of the stool. You may have this test every year starting at age 59.  Flexible sigmoidoscopy or colonoscopy. You may have a sigmoidoscopy every 5 years or a colonoscopy every 10 years starting at age 78.  Hepatitis C blood test.  Hepatitis B blood test.  Sexually transmitted disease (STD) testing.  Diabetes screening. This is done by checking your blood sugar (glucose) after you have not eaten for a while (fasting). You may have this done every 1-3 years.  Bone density scan. This is done to screen for osteoporosis. You may have this done starting at age 79.  Mammogram. This may be done every 1-2 years. Talk to your health care provider about how often you should have regular mammograms. Talk with your health care provider about your test results, treatment options, and if necessary,  the need for more tests. Vaccines  Your health care provider may recommend certain vaccines, such as:  Influenza vaccine. This is recommended every year.  Tetanus, diphtheria, and acellular  pertussis (Tdap, Td) vaccine. You may need a Td booster every 10 years.  Zoster vaccine. You may need this after age 23.  Pneumococcal 13-valent conjugate (PCV13) vaccine. One dose is recommended after age 58.  Pneumococcal polysaccharide (PPSV23) vaccine. One dose is recommended after age 42. Talk to your health care provider about which screenings and vaccines you need and how often you need them. This information is not intended to replace advice given to you by your health care provider. Make sure you discuss any questions you have with your health care provider. Document Released: 06/19/2015 Document Revised: 02/10/2016 Document Reviewed: 03/24/2015 Elsevier Interactive Patient Education  2017 [place] Prevention in the Home Falls can cause injuries. They can happen to people of all ages. There are many things you can do to make your home safe and to help prevent falls. What can I do on the outside of my home?  Regularly fix the edges of walkways and driveways and fix any cracks.  Remove anything that might make you trip as you walk through a door, such as a raised step or threshold.  Trim any bushes or trees on the path to your home.  Use bright outdoor lighting.  Clear any walking paths of anything that might make someone trip, such as rocks or tools.  Regularly check to see if handrails are loose or broken. Make sure that both sides of any steps have handrails.  Any raised decks and porches should have guardrails on the edges.  Have any leaves, snow, or ice cleared regularly.  Use sand or salt on walking paths during winter.  Clean up any spills in your garage right away. This includes oil or grease spills. What can I do in the bathroom?  Use night lights.  Install grab bars by the toilet and in the tub and shower. Do not use towel bars as grab bars.  Use non-skid mats or decals in the tub or shower.  If you need to sit down in the shower, use a plastic,  non-slip stool.  Keep the floor dry. Clean up any water that spills on the floor as soon as it happens.  Remove soap buildup in the tub or shower regularly.  Attach bath mats securely with double-sided non-slip rug tape.  Do not have throw rugs and other things on the floor that can make you trip. What can I do in the bedroom?  Use night lights.  Make sure that you have a light by your bed that is easy to reach.  Do not use any sheets or blankets that are too big for your bed. They should not hang down onto the floor.  Have a firm chair that has side arms. You can use this for support while you get dressed.  Do not have throw rugs and other things on the floor that can make you trip. What can I do in the kitchen?  Clean up any spills right away.  Avoid walking on wet floors.  Keep items that you use a lot in easy-to-reach places.  If you need to reach something above you, use a strong step stool that has a grab bar.  Keep electrical cords out of the way.  Do not use floor polish or wax that makes floors slippery. If you must use  wax, use non-skid floor wax.  Do not have throw rugs and other things on the floor that can make you trip. What can I do with my stairs?  Do not leave any items on the stairs.  Make sure that there are handrails on both sides of the stairs and use them. Fix handrails that are broken or loose. Make sure that handrails are as long as the stairways.  Check any carpeting to make sure that it is firmly attached to the stairs. Fix any carpet that is loose or worn.  Avoid having throw rugs at the top or bottom of the stairs. If you do have throw rugs, attach them to the floor with carpet tape.  Make sure that you have a light switch at the top of the stairs and the bottom of the stairs. If you do not have them, ask someone to add them for you. What else can I do to help prevent falls?  Wear shoes that:  Do not have high heels.  Have rubber  bottoms.  Are comfortable and fit you well.  Are closed at the toe. Do not wear sandals.  If you use a stepladder:  Make sure that it is fully opened. Do not climb a closed stepladder.  Make sure that both sides of the stepladder are locked into place.  Ask someone to hold it for you, if possible.  Clearly mark and make sure that you can see:  Any grab bars or handrails.  First and last steps.  Where the edge of each step is.  Use tools that help you move around (mobility aids) if they are needed. These include:  Canes.  Walkers.  Scooters.  Crutches.  Turn on the lights when you go into a dark area. Replace any light bulbs as soon as they burn out.  Set up your furniture so you have a clear path. Avoid moving your furniture around.  If any of your floors are uneven, fix them.  If there are any pets around you, be aware of where they are.  Review your medicines with your doctor. Some medicines can make you feel dizzy. This can increase your chance of falling. Ask your doctor what other things that you can do to help prevent falls. This information is not intended to replace advice given to you by your health care provider. Make sure you discuss any questions you have with your health care provider. Document Released: 03/19/2009 Document Revised: 10/29/2015 Document Reviewed: 06/27/2014 Elsevier Interactive Patient Education  2017 [employer].

## 2020-05-13 ENCOUNTER — Other Ambulatory Visit: Payer: Self-pay | Admitting: Family Medicine

## 2020-05-15 ENCOUNTER — Other Ambulatory Visit: Payer: Self-pay

## 2020-05-15 ENCOUNTER — Ambulatory Visit (INDEPENDENT_AMBULATORY_CARE_PROVIDER_SITE_OTHER): Payer: Medicare Other | Admitting: Family Medicine

## 2020-05-15 ENCOUNTER — Encounter: Payer: Self-pay | Admitting: Family Medicine

## 2020-05-15 VITALS — BP 152/94 | HR 100 | Temp 97.5°F | Ht 66.5 in | Wt 157.5 lb

## 2020-05-15 DIAGNOSIS — M8580 Other specified disorders of bone density and structure, unspecified site: Secondary | ICD-10-CM

## 2020-05-15 DIAGNOSIS — Z8673 Personal history of transient ischemic attack (TIA), and cerebral infarction without residual deficits: Secondary | ICD-10-CM

## 2020-05-15 DIAGNOSIS — I10 Essential (primary) hypertension: Secondary | ICD-10-CM | POA: Diagnosis not present

## 2020-05-15 DIAGNOSIS — K922 Gastrointestinal hemorrhage, unspecified: Secondary | ICD-10-CM

## 2020-05-15 DIAGNOSIS — Z0000 Encounter for general adult medical examination without abnormal findings: Secondary | ICD-10-CM

## 2020-05-15 DIAGNOSIS — E785 Hyperlipidemia, unspecified: Secondary | ICD-10-CM | POA: Diagnosis not present

## 2020-05-15 DIAGNOSIS — I6523 Occlusion and stenosis of bilateral carotid arteries: Secondary | ICD-10-CM

## 2020-05-15 DIAGNOSIS — I350 Nonrheumatic aortic (valve) stenosis: Secondary | ICD-10-CM

## 2020-05-15 DIAGNOSIS — G4700 Insomnia, unspecified: Secondary | ICD-10-CM

## 2020-05-15 DIAGNOSIS — I480 Paroxysmal atrial fibrillation: Secondary | ICD-10-CM

## 2020-05-15 DIAGNOSIS — E559 Vitamin D deficiency, unspecified: Secondary | ICD-10-CM

## 2020-05-15 DIAGNOSIS — E039 Hypothyroidism, unspecified: Secondary | ICD-10-CM | POA: Diagnosis not present

## 2020-05-15 LAB — CBC WITH DIFFERENTIAL/PLATELET
Basophils Absolute: 0 10*3/uL (ref 0.0–0.1)
Basophils Relative: 1 % (ref 0.0–3.0)
Eosinophils Absolute: 0.1 10*3/uL (ref 0.0–0.7)
Eosinophils Relative: 3.1 % (ref 0.0–5.0)
HCT: 40.5 % (ref 36.0–46.0)
Hemoglobin: 13.7 g/dL (ref 12.0–15.0)
Lymphocytes Relative: 25.5 % (ref 12.0–46.0)
Lymphs Abs: 1.2 10*3/uL (ref 0.7–4.0)
MCHC: 33.9 g/dL (ref 30.0–36.0)
MCV: 99.7 fl (ref 78.0–100.0)
Monocytes Absolute: 0.5 10*3/uL (ref 0.1–1.0)
Monocytes Relative: 10.4 % (ref 3.0–12.0)
Neutro Abs: 2.8 10*3/uL (ref 1.4–7.7)
Neutrophils Relative %: 60 % (ref 43.0–77.0)
Platelets: 236 10*3/uL (ref 150.0–400.0)
RBC: 4.07 Mil/uL (ref 3.87–5.11)
RDW: 13.5 % (ref 11.5–15.5)
WBC: 4.7 10*3/uL (ref 4.0–10.5)

## 2020-05-15 LAB — LIPID PANEL
Cholesterol: 200 mg/dL (ref 0–200)
HDL: 62.4 mg/dL
LDL Cholesterol: 111 mg/dL — ABNORMAL HIGH (ref 0–99)
NonHDL: 137.67
Total CHOL/HDL Ratio: 3
Triglycerides: 135 mg/dL (ref 0.0–149.0)
VLDL: 27 mg/dL (ref 0.0–40.0)

## 2020-05-15 LAB — COMPREHENSIVE METABOLIC PANEL WITH GFR
ALT: 14 U/L (ref 0–35)
AST: 15 U/L (ref 0–37)
Albumin: 4.6 g/dL (ref 3.5–5.2)
Alkaline Phosphatase: 60 U/L (ref 39–117)
BUN: 24 mg/dL — ABNORMAL HIGH (ref 6–23)
CO2: 31 meq/L (ref 19–32)
Calcium: 9.7 mg/dL (ref 8.4–10.5)
Chloride: 99 meq/L (ref 96–112)
Creatinine, Ser: 1.04 mg/dL (ref 0.40–1.20)
GFR: 49.03 mL/min — ABNORMAL LOW
Glucose, Bld: 93 mg/dL (ref 70–99)
Potassium: 4.1 meq/L (ref 3.5–5.1)
Sodium: 140 meq/L (ref 135–145)
Total Bilirubin: 0.8 mg/dL (ref 0.2–1.2)
Total Protein: 7.4 g/dL (ref 6.0–8.3)

## 2020-05-15 LAB — T4, FREE: Free T4: 1.45 ng/dL (ref 0.60–1.60)

## 2020-05-15 LAB — TSH: TSH: 2.47 u[IU]/mL (ref 0.35–4.50)

## 2020-05-15 LAB — VITAMIN D 25 HYDROXY (VIT D DEFICIENCY, FRACTURES): VITD: 80.75 ng/mL (ref 30.00–100.00)

## 2020-05-15 MED ORDER — LOSARTAN POTASSIUM 100 MG PO TABS
100.0000 mg | ORAL_TABLET | Freq: Every day | ORAL | 3 refills | Status: DC
Start: 2020-05-15 — End: 2020-11-18

## 2020-05-15 MED ORDER — LEVOTHYROXINE SODIUM 75 MCG PO TABS
75.0000 ug | ORAL_TABLET | Freq: Every day | ORAL | 3 refills | Status: DC
Start: 2020-05-15 — End: 2021-05-18

## 2020-05-15 MED ORDER — VITAMIN C GUMMIE 120 MG PO CHEW: 250.0000 mg | CHEWABLE_TABLET | Freq: Every day | ORAL | Status: AC

## 2020-05-15 MED ORDER — ATORVASTATIN CALCIUM 10 MG PO TABS
10.0000 mg | ORAL_TABLET | Freq: Every day | ORAL | 3 refills | Status: DC
Start: 2020-05-15 — End: 2020-05-18

## 2020-05-15 MED ORDER — HYDROCHLOROTHIAZIDE 25 MG PO TABS
25.0000 mg | ORAL_TABLET | Freq: Every day | ORAL | 3 refills | Status: DC
Start: 2020-05-15 — End: 2021-05-18

## 2020-05-15 NOTE — Assessment & Plan Note (Signed)
Continue 1000 IU daily. Update levels.

## 2020-05-15 NOTE — Assessment & Plan Note (Signed)
Preventative protocols reviewed and updated unless pt declined. Discussed healthy diet and lifestyle.  

## 2020-05-15 NOTE — Assessment & Plan Note (Signed)
Mild murmur on exam, echo from 11/2019 showing moderate aortic stenosis. Will continue to monitor.

## 2020-05-15 NOTE — Assessment & Plan Note (Signed)
Managed with nightly benadryl.  Discussed possible anticholinergic effect of regular benadryl.

## 2020-05-15 NOTE — Assessment & Plan Note (Signed)
Chronic, stable. Update lipid levels on daily low dose lipitor.  The ASCVD Risk score Mikey Bussing DC Jr., et al., 2013) failed to calculate for the following reasons:   The 2013 ASCVD risk score is only valid for ages 10 to 67

## 2020-05-15 NOTE — Assessment & Plan Note (Signed)
Update labs. Continue replacement.

## 2020-05-15 NOTE — Progress Notes (Signed)
Patient ID: Judy Walker, female    DOB: [birth year], 86 y.o.   MRN: [id number]  This visit was conducted in person.  BP (!) 152/94 (BP Location: Right Arm, Patient Position: Sitting, Cuff Size: Normal)   Pulse 100   Temp (!) 97.5 F (36.4 C) (Temporal)   Ht 5' 6.5" (1.689 m)   Wt 157 lb 8 oz (71.4 kg)   SpO2 97%   BMI 25.04 kg/m    CC: CPE Subjective:   HPI: Judy Walker is a 86 y.o. female presenting on 05/15/2020 for Annual Exam (Prt 2.)   Saw health advisor Tuesday for medicare wellness visit. Note reviewed. Noted foot edema (after prolonged standing - resolved with leg elevation) and requested handicap placard as she notices dyspnea and palpitations with prolonged walking.   No exam data present  Flowsheet Row Clinical Support from 05/12/2020 in [place] at [place]  PHQ-2 Total Score 0      Fall Risk  05/12/2020 11/18/2019 03/22/2019 09/26/2017 09/22/2016  Falls in the past year? 0 1 0 No No  Number falls in past yr: 0 0 - - -  Injury with Fall? 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 Eliquis was stopped (afib s/p cardioversion 06/2019 with recurrence), PPI started. S/p EGD showing 4cm HH, healed ulcers, likely scarring as mucosal change. Eliquis restarted 01/2020.   She's given up sweet tea. Only drinking water (1 cup of coffee in the morning).   Preventative: Colon cancer screening -has declinedcolonoscopy. iFOB positive2015. We referred to Dr Carlean Purl per pt preference 2015, pt did not go. No h/o hemorrhoids. No fmhx colon cancer. Rpt iFOB returned negative (2016). Requestedto stop screening.  Well woman -  benign hysterectomy 2003, ovarian cysts.  Mammogram - Birads1 03/2019. Will rptQ2 yrs. Does breast exams at home. Sister x2 with breast cancer.  DEXA - T score -1.9 (07/2012),T -2.3 femur(09/2015). Has been on fosamax since ~2014. Stopped 2019. DEXA 03/2019 - T -2.0 hip.  Doesn't like dairy products. Takes calc supplement and vit D. Discussed weight bearing exercise.  Flu shot yearly Pneumovax 2006, prevnar 2015. [place] 07/2019, 08/2019, 03/2020 Td 2012.  zostavax - 09/2013. shingrix - discussed.Declines.  Advanced directives -scanned and in chart 09/2017. HCPOA are son Jenny Reichmann and daughterBeverly.Does not desire life prolonging measures if terminal condition Seatbelt use discussed. Sunscreen use discussed. No suspicious moles.  Non smoker  Alcohol - rarely  Dentist - due  Eye doctor - QOYr Bowel - no constipation  Bladder - no incontinence   Doesn't drive - never has. Lives alone. Widow 2012. Spends 1/2 year at [employer] Daughter lives nearby. Son also helps.  Occupation: retired Radiation protection practitioner  Activity: Regular exercise - walking 1-2 miles daily, stays active in yard.  Diet: daily fruits/vegetables, some water.      Relevant past medical, surgical, family and social history reviewed and updated as indicated. Interim medical history since our last visit reviewed. Allergies and medications reviewed and updated. Outpatient Medications Prior to Visit  Medication Sig Dispense Refill  . apixaban (ELIQUIS) 5 MG TABS tablet Take 1 tablet (5 mg total) by mouth 2 (two) times daily. 60 tablet   .  Calcium Carbonate (CALCIUM 500 PO) Take 500 mg by mouth daily.    . Cholecalciferol (VITAMIN D3) 25 MCG (1000 UT) CAPS Take 1 capsule (1,000 Units total) by mouth daily. 30 capsule   . diphenhydrAMINE (BENADRYL) 25 mg capsule Take 25 mg by mouth at bedtime.    . Multiple Vitamins-Minerals (MULTIVITAMIN ADULT) TABS Take 1 tablet by mouth daily.    . pantoprazole (PROTONIX) 40 MG tablet  Take 1 tablet (40 mg total) by mouth daily. 90 tablet 1  . potassium gluconate 595 (99 K) MG TABS tablet Take 595 mg by mouth daily.    . Ascorbic Acid (VITAMIN C GUMMIE PO) Take 250 mg by mouth daily.     Marland Kitchen atorvastatin (LIPITOR) 10 MG tablet TAKE 1 TABLET BY MOUTH EVERY DAY 90 tablet 0  . hydrochlorothiazide (HYDRODIURIL) 25 MG tablet TAKE 1 TABLET BY MOUTH EVERY DAY 90 tablet 1  . levothyroxine (SYNTHROID) 75 MCG tablet TAKE 1 TABLET BY MOUTH EVERY DAY 90 tablet 3  . losartan (COZAAR) 100 MG tablet TAKE 1 TABLET BY MOUTH EVERY DAY 90 tablet 1  . diltiazem (CARDIZEM CD) 240 MG 24 hr capsule Take 1 capsule (240 mg total) by mouth daily. 90 capsule 3   No facility-administered medications prior to visit.     Per HPI unless specifically indicated in ROS section below Review of Systems  Constitutional: Negative for activity change, appetite change, chills, fatigue, fever and unexpected weight change.  HENT: Negative for hearing loss.   Eyes: Negative for visual disturbance.  Respiratory: Negative for cough, chest tightness, shortness of breath and wheezing.   Cardiovascular: Positive for palpitations (with prolonged walking) and leg swelling (foot swelling). Negative for chest pain.  Gastrointestinal: Negative for abdominal distention, abdominal pain, blood in stool, constipation, diarrhea, nausea and vomiting.  Genitourinary: Negative for difficulty urinating and hematuria.  Musculoskeletal: Negative for arthralgias, myalgias and neck pain.  Skin: Negative for rash.  Neurological: Negative for dizziness, seizures, syncope and headaches.  Hematological: Negative for adenopathy. Does not bruise/bleed easily.  Psychiatric/Behavioral: Negative for dysphoric mood. The patient is not nervous/anxious.    Objective:  BP (!) 152/94 (BP Location: Right Arm, Patient Position: Sitting, Cuff Size: Normal)   Pulse 100   Temp (!) 97.5 F (36.4 C) (Temporal)   Ht 5' 6.5" (1.689 m)   Wt 157 lb 8 oz  (71.4 kg)   SpO2 97%   BMI 25.04 kg/m   Wt Readings from Last 3 Encounters:  05/15/20 157 lb 8 oz (71.4 kg)  02/13/20 156 lb (70.8 kg)  01/13/20 157 lb (71.2 kg)      Physical Exam Vitals and nursing note reviewed.  Constitutional:      General: She is not in acute distress.    Appearance: Normal appearance. She is well-developed and well-nourished. She is not ill-appearing.  HENT:     Head: Normocephalic and atraumatic.     Right Ear: Hearing, tympanic membrane, ear canal and external ear normal.     Left Ear: Hearing, tympanic membrane, ear canal and external ear normal.     Mouth/Throat:     Mouth: Oropharynx is clear and moist and mucous membranes are normal.     Pharynx: Uvula midline. No posterior oropharyngeal edema.  Eyes:     General: No scleral icterus.    Extraocular Movements: Extraocular movements intact and EOM normal.     Conjunctiva/sclera: Conjunctivae normal.     Pupils: Pupils are equal, round, and reactive to light.  Neck:  Vascular: Carotid bruit present.  Cardiovascular:     Rate and Rhythm: Normal rate and regular rhythm.     Pulses: Normal pulses and intact distal pulses.          Radial pulses are 2+ on the right side and 2+ on the left side.     Heart sounds: Murmur (2/6 systolic best at USB) heard.    Pulmonary:     Effort: Pulmonary effort is normal. No respiratory distress.     Breath sounds: Normal breath sounds. No wheezing, rhonchi or rales.  Abdominal:     General: Abdomen is flat. Bowel sounds are normal. There is no distension.     Palpations: Abdomen is soft. There is no mass.     Tenderness: There is no abdominal tenderness. There is no guarding or rebound.     Hernia: No hernia is present.  Musculoskeletal:        General: No edema. Normal range of motion.     Cervical back: Normal range of motion and neck supple.     Right lower leg: No edema.     Left lower leg: No edema.  Lymphadenopathy:     Cervical: No cervical  adenopathy.  Skin:    General: Skin is warm and dry.     Findings: No rash.  Neurological:     General: No focal deficit present.     Mental Status: She is alert and oriented to person, place, and time.     Comments: CN grossly intact, station and gait intact  Psychiatric:        Mood and Affect: Mood and affect and mood normal.        Behavior: Behavior normal.        Thought Content: Thought content normal.        Judgment: Judgment normal.       Results for orders placed or performed in visit on 02/13/20  CBC  Result Value Ref Range   WBC 6.0 3.4 - 10.8 x10E3/uL   RBC 3.88 3.77 - 5.28 x10E6/uL   Hemoglobin 12.9 11.1 - 15.9 g/dL   Hematocrit 37.8 34.0 - 46.6 %   MCV 97 79 - 97 fL   MCH 33.2 (H) 26.6 - 33.0 pg   MCHC 34.1 31.5 - 35.7 g/dL   RDW 12.6 11.7 - 15.4 %   Platelets 266 150 - 450 [id number]/TO  Basic metabolic panel  Result Value Ref Range   Glucose 91 65 - 99 mg/dL   BUN 27 8 - 27 mg/dL   Creatinine, Ser 1.08 (H) 0.57 - 1.00 mg/dL   GFR calc non Af Amer 47 (L) >59 mL/min/1.73   GFR calc Af Amer 54 (L) >59 mL/min/1.73   BUN/Creatinine Ratio 25 12 - 28   Sodium 143 134 - 144 mmol/L   Potassium 3.8 3.5 - 5.2 mmol/L   Chloride 102 96 - 106 mmol/L   CO2 25 20 - 29 mmol/L   Calcium 9.6 8.7 - 10.3 mg/dL   Assessment & Plan:  This visit occurred during the SARS-CoV-2 public health emergency.  Safety protocols were in place, including screening questions prior to the visit, additional usage of staff PPE, and extensive cleaning of exam room while observing appropriate contact time as indicated for disinfecting solutions.   Problem List Items Addressed This Visit    Vitamin D deficiency    Continue 1000 IU daily. Update levels.       Relevant Orders   VITAMIN D 25  Hydroxy (Vit-D Deficiency, Fractures)   Upper GI bleed    Due to gastric ulcer, this seems fully resolved with latest EGD 12/2019 showing healed ulcer. Update CBC. Continue PPI.       Relevant Orders   CBC  with Differential/Platelet   Paroxysmal atrial fibrillation (HCC)    Rate controlled on dilt and eliquis.       Relevant Medications   atorvastatin (LIPITOR) 10 MG tablet   hydrochlorothiazide (HYDRODIURIL) 25 MG tablet   losartan (COZAAR) 100 MG tablet   Other Relevant Orders   CBC with Differential/Platelet   Osteopenia    Encouraged good calcium in the diet and continue daily vitamin D as well as routine weight bearing activity.       Moderate aortic stenosis by prior echocardiogram    Mild murmur on exam, echo from 11/2019 showing moderate aortic stenosis. Will continue to monitor.       Relevant Medications   atorvastatin (LIPITOR) 10 MG tablet   hydrochlorothiazide (HYDRODIURIL) 25 MG tablet   losartan (COZAAR) 100 MG tablet   Insomnia    Managed with nightly benadryl.  Discussed possible anticholinergic effect of regular benadryl.       Hypothyroidism    Update labs. Continue replacement.       Relevant Medications   levothyroxine (SYNTHROID) 75 MCG tablet   Other Relevant Orders   TSH   T4, free   HLD (hyperlipidemia)    Chronic, stable. Update lipid levels on daily low dose lipitor.  The ASCVD Risk score Mikey Bussing DC Jr., et al., 2013) failed to calculate for the following reasons:   The 2013 ASCVD risk score is only valid for ages 73 to 63       Relevant Medications   atorvastatin (LIPITOR) 10 MG tablet   hydrochlorothiazide (HYDRODIURIL) 25 MG tablet   losartan (COZAAR) 100 MG tablet   Other Relevant Orders   Lipid panel   Comprehensive metabolic panel   History of ischemic stroke    Old lacunar strokes - incidental finding on imaging 11/2019. Continue eliquis.       Health maintenance examination - Primary    Preventative protocols reviewed and updated unless pt declined. Discussed healthy diet and lifestyle.       Essential hypertension    Mildly elevated. No changes made today.       Relevant Medications   atorvastatin (LIPITOR) 10 MG tablet    hydrochlorothiazide (HYDRODIURIL) 25 MG tablet   losartan (COZAAR) 100 MG tablet   Carotid stenosis, bilateral    Reviewed with patient.  Update carotid US 11/2020.       Relevant Medications   atorvastatin (LIPITOR) 10 MG tablet   hydrochlorothiazide (HYDRODIURIL) 25 MG tablet   losartan (COZAAR) 100 MG tablet       Meds ordered this encounter  Medications  . Ascorbic Acid (VITAMIN C GUMMIE) 120 MG CHEW    Sig: Chew 250 mg by mouth daily.  Marland Kitchen atorvastatin (LIPITOR) 10 MG tablet    Sig: Take 1 tablet (10 mg total) by mouth daily.    Dispense:  90 tablet    Refill:  3  . hydrochlorothiazide (HYDRODIURIL) 25 MG tablet    Sig: Take 1 tablet (25 mg total) by mouth daily.    Dispense:  90 tablet    Refill:  3  . levothyroxine (SYNTHROID) 75 MCG tablet    Sig: Take 1 tablet (75 mcg total) by mouth daily.    Dispense:  90 tablet  Refill:  3  . losartan (COZAAR) 100 MG tablet    Sig: Take 1 tablet (100 mg total) by mouth daily.    Dispense:  90 tablet    Refill:  3   Orders Placed This Encounter  Procedures  . VITAMIN D 25 Hydroxy (Vit-D Deficiency, Fractures)  . Lipid panel  . Comprehensive metabolic panel  . TSH  . T4, free  . CBC with Differential/Platelet    Patient instructions: Labs today  Call to schedule mammogram at your convenience: [place] [phone]. Consider mammogram every 18 months.  If interested, check with pharmacy about new 2 shot shingles series (shingrix).  Contact us in 11/2020 about possible repeat carotid ultrasound.  Return in 1 year for next wellness visit.   Follow up plan: Return in about 1 year (around 05/15/2021) for annual exam, prior fasting for blood work, medicare wellness visit.  Ria Bush, MD

## 2020-05-15 NOTE — Assessment & Plan Note (Signed)
Encouraged good calcium in the diet and continue daily vitamin D as well as routine weight bearing activity.

## 2020-05-15 NOTE — Assessment & Plan Note (Signed)
Rate controlled on dilt and eliquis.

## 2020-05-15 NOTE — Assessment & Plan Note (Addendum)
Due to gastric ulcer, this seems fully resolved with latest EGD 12/2019 showing healed ulcer. Update CBC. Continue PPI.

## 2020-05-15 NOTE — Assessment & Plan Note (Addendum)
Old lacunar strokes - incidental finding on imaging 11/2019. Continue eliquis.

## 2020-05-15 NOTE — Assessment & Plan Note (Signed)
Reviewed with patient.  Update carotid US 11/2020.

## 2020-05-15 NOTE — Assessment & Plan Note (Signed)
Mildly elevated. No changes made today.

## 2020-05-15 NOTE — Patient Instructions (Addendum)
Labs today  Call to schedule mammogram at your convenience: [facility] [phone]. Consider mammogram every 18 months.  If interested, check with pharmacy about new 2 shot shingles series (shingrix).  Contact us in 11/2020 about possible repeat carotid ultrasound.  Return in 1 year for next wellness visit.   Health Maintenance After Age 84 After age 15, you are at a higher risk for certain long-term diseases and infections as well as injuries from falls. Falls are a major cause of broken bones and head injuries in people who are older than age 70. Getting regular preventive care can help to keep you healthy and well. Preventive care includes getting regular testing and making lifestyle changes as recommended by your health care provider. Talk with your health care provider about:  Which screenings and tests you should have. A screening is a test that checks for a disease when you have no symptoms.  A diet and exercise plan that is right for you. What should I know about screenings and tests to prevent falls? Screening and testing are the best ways to find a health problem early. Early diagnosis and treatment give you the best chance of managing medical conditions that are common after age 60. Certain conditions and lifestyle choices may make you more likely to have a fall. Your health care provider may recommend:  Regular vision checks. Poor vision and conditions such as cataracts can make you more likely to have a fall. If you wear glasses, make sure to get your prescription updated if your vision changes.  Medicine review. Work with your health care provider to regularly review all of the medicines you are taking, including over-the-counter medicines. Ask your health care provider about any side effects that may make you more likely to have a fall. Tell your health care provider if any medicines that you take make you feel dizzy or sleepy.  Osteoporosis screening.  Osteoporosis is a condition that causes the bones to get weaker. This can make the bones weak and cause them to break more easily.  Blood pressure screening. Blood pressure changes and medicines to control blood pressure can make you feel dizzy.  Strength and balance checks. Your health care provider may recommend certain tests to check your strength and balance while standing, walking, or changing positions.  Foot health exam. Foot pain and numbness, as well as not wearing proper footwear, can make you more likely to have a fall.  Depression screening. You may be more likely to have a fall if you have a fear of falling, feel emotionally low, or feel unable to do activities that you used to do.  Alcohol use screening. Using too much alcohol can affect your balance and may make you more likely to have a fall. What actions can I take to lower my risk of falls? General instructions  Talk with your health care provider about your risks for falling. Tell your health care provider if: ? You fall. Be sure to tell your health care provider about all falls, even ones that seem minor. ? You feel dizzy, sleepy, or off-balance.  Take over-the-counter and prescription medicines only as told by your health care provider. These include any supplements.  Eat a healthy diet and maintain a healthy weight. A healthy diet includes low-fat dairy products, low-fat (lean) meats, and fiber from whole grains, beans, and lots of fruits and vegetables. Home safety  Remove any tripping hazards, such as rugs, cords, and clutter.  Install safety equipment  such as grab bars in bathrooms and safety rails on stairs.  Keep rooms and walkways well-lit. Activity   Follow a regular exercise program to stay fit. This will help you maintain your balance. Ask your health care provider what types of exercise are appropriate for you.  If you need a cane or walker, use it as recommended by your health care provider.  Wear  supportive shoes that have nonskid soles. Lifestyle  Do not drink alcohol if your health care provider tells you not to drink.  If you drink alcohol, limit how much you have: ? 0-1 drink a day for women. ? 0-2 drinks a day for men.  Be aware of how much alcohol is in your drink. In the [place] one drink equals one typical bottle of beer (12 oz), one-half glass of wine (5 oz), or one shot of hard liquor (1 oz).  Do not use any products that contain nicotine or tobacco, such as cigarettes and e-cigarettes. If you need help quitting, ask your health care provider. Summary  Having a healthy lifestyle and getting preventive care can help to protect your health and wellness after age 21.  Screening and testing are the best way to find a health problem early and help you avoid having a fall. Early diagnosis and treatment give you the best chance for managing medical conditions that are more common for people who are older than age 11.  Falls are a major cause of broken bones and head injuries in people who are older than age 72. Take precautions to prevent a fall at home.  Work with your health care provider to learn what changes you can make to improve your health and wellness and to prevent falls. This information is not intended to replace advice given to you by your health care provider. Make sure you discuss any questions you have with your health care provider. Document Revised: 09/13/2018 Document Reviewed: 04/05/2017 Elsevier Patient Education  2020 [employer].

## 2020-05-16 ENCOUNTER — Other Ambulatory Visit: Payer: Self-pay | Admitting: Cardiology

## 2020-05-18 ENCOUNTER — Other Ambulatory Visit: Payer: Self-pay

## 2020-05-18 ENCOUNTER — Telehealth: Payer: Self-pay

## 2020-05-18 ENCOUNTER — Telehealth: Payer: Self-pay | Admitting: Family Medicine

## 2020-05-18 MED ORDER — ATORVASTATIN CALCIUM 20 MG PO TABS: 20.0000 mg | ORAL_TABLET | Freq: Every day | ORAL | 3 refills | Status: DC

## 2020-05-18 NOTE — Telephone Encounter (Signed)
Error

## 2020-05-18 NOTE — Telephone Encounter (Signed)
Per Dr. Darnell Level, e-scribed atorvastatin 20 mg tab daily, #90/3. (see Labs- Result Notes, 05/15/20)

## 2020-05-18 NOTE — Telephone Encounter (Signed)
Prescription refill request for Eliquis received.  Indication: afib Last office visit: [place], 02/13/2020 Scr: 1.04, 05/15/2020 Age: 86 Weight: 71.4 kg   Prescription refill sent.

## 2020-06-09 ENCOUNTER — Telehealth (INDEPENDENT_AMBULATORY_CARE_PROVIDER_SITE_OTHER): Payer: Medicare Other | Admitting: Family Medicine

## 2020-06-09 ENCOUNTER — Telehealth: Payer: Self-pay | Admitting: *Deleted

## 2020-06-09 ENCOUNTER — Encounter: Payer: Self-pay | Admitting: Family Medicine

## 2020-06-09 VITALS — Temp 97.5°F

## 2020-06-09 DIAGNOSIS — I480 Paroxysmal atrial fibrillation: Secondary | ICD-10-CM

## 2020-06-09 DIAGNOSIS — R059 Cough, unspecified: Secondary | ICD-10-CM

## 2020-06-09 MED ORDER — BENZONATATE 100 MG PO CAPS: 100.0000 mg | ORAL_CAPSULE | Freq: Three times a day (TID) | ORAL | 0 refills | Status: DC | PRN

## 2020-06-09 MED ORDER — AZITHROMYCIN 250 MG PO TABS: ORAL_TABLET | ORAL | 0 refills | Status: DC

## 2020-06-09 NOTE — Assessment & Plan Note (Signed)
Anticipate acute bronchitis but will cover for atypical infection given duration, progression, post tussive emesis.  rec plain mucinex, zpack antibiotic, will also Rx tessalon perls. Pt declines cheratussin cough syrup.  Update if not improving with treatment. Pt agrees with plan.

## 2020-06-09 NOTE — Progress Notes (Signed)
Patient ID: Judy Walker, female    DOB: 23-Sep-1932, 87 y.o.   MRN: [id number]  Virtual visit completed through MyChart, a video enabled telemedicine application. Due to national recommendations of social distancing due to COVID-19, a virtual visit is felt to be most appropriate for this patient at this time. Reviewed limitations, risks, security and privacy concerns of performing a virtual visit and the availability of in person appointments. I also reviewed that there may be a patient responsible charge related to this service. The patient agreed to proceed.   Interactive audio and video telecommunications were attempted between myself and Judy Walker, however failed due to patient having technical difficulties OR patient not having access to video capability.  We continued and completed visit with audio only.  Time: 4:25pm - 4:39pm   Patient location: home Provider location: [place] at [facility], office Persons participating in this virtual visit: patient, provider   If any vitals were documented, they were collected by patient at home unless specified below.    Temp (!) 97.5 F (36.4 C) Comment: forehead   CC: cough/congestion Subjective:   HPI: Judy Walker is a 87 y.o. female presenting on 06/09/2020 for URI (10 days cough with chest congestion.)   10d ago wet sounding cough started - unable to bring up much mucous, malaise, post tussive emesis. Cough affecting sleep. Initial ST now better. Chest > head congestion.   Afebrile. No ear or tooth pain, headache, ST, body aches, abd pain, nausea, diarrhea. No diaphoresis. No chest pain.   Treating with corcedin.  Feels she's staying well hydrated.   Daughter sick after her with similar symptoms - she tested negative for covid.   Afib on eliquis.  States BP at home running well.  COVID vaccine Pfizer 07/2019, 08/2019, 03/2020.      Relevant past medical, surgical, family and social history reviewed and updated  as indicated. Interim medical history since our last visit reviewed. Allergies and medications reviewed and updated. Outpatient Medications Prior to Visit  Medication Sig Dispense Refill  . Ascorbic Acid (VITAMIN C GUMMIE) 120 MG CHEW Chew 250 mg by mouth daily.    Marland Kitchen atorvastatin (LIPITOR) 20 MG tablet Take 1 tablet (20 mg total) by mouth daily. 90 tablet 3  . Calcium Carbonate (CALCIUM 500 PO) Take 500 mg by mouth daily.    . Cholecalciferol (VITAMIN D3) 25 MCG (1000 UT) CAPS Take 1 capsule (1,000 Units total) by mouth daily. 30 capsule   . diphenhydrAMINE (BENADRYL) 25 mg capsule Take 25 mg by mouth at bedtime.    Marland Kitchen ELIQUIS 5 MG TABS tablet TAKE 1 TABLET BY MOUTH TWICE A DAY 180 tablet 1  . hydrochlorothiazide (HYDRODIURIL) 25 MG tablet Take 1 tablet (25 mg total) by mouth daily. 90 tablet 3  . levothyroxine (SYNTHROID) 75 MCG tablet Take 1 tablet (75 mcg total) by mouth daily. 90 tablet 3  . losartan (COZAAR) 100 MG tablet Take 1 tablet (100 mg total) by mouth daily. 90 tablet 3  . Multiple Vitamins-Minerals (MULTIVITAMIN ADULT) TABS Take 1 tablet by mouth daily.    . pantoprazole (PROTONIX) 40 MG tablet Take 1 tablet (40 mg total) by mouth daily. 90 tablet 1  . potassium gluconate 595 (99 K) MG TABS tablet Take 595 mg by mouth daily.    Marland Kitchen diltiazem (CARDIZEM CD) 240 MG 24 hr capsule Take 1 capsule (240 mg total) by mouth daily. 90 capsule 3   No facility-administered medications prior to visit.  Per HPI unless specifically indicated in ROS section below Review of Systems Objective:  Temp (!) 97.5 F (36.4 C) Comment: forehead  Wt Readings from Last 3 Encounters:  05/15/20 157 lb 8 oz (71.4 kg)  02/13/20 156 lb (70.8 kg)  01/13/20 157 lb (71.2 kg)       Physical exam: Gen: alert, NAD, not ill appearing Pulm: speaks in complete sentences without increased work of breathing Psych: normal mood, normal thought content      Assessment & Plan:   Problem List Items Addressed  This Visit    Paroxysmal atrial fibrillation (HCC)   Cough - Primary    Anticipate acute bronchitis but will cover for atypical infection given duration, progression, post tussive emesis.  rec plain mucinex, zpack antibiotic, will also Rx tessalon perls. Pt declines cheratussin cough syrup.  Update if not improving with treatment. Pt agrees with plan.           Meds ordered this encounter  Medications  . azithromycin (ZITHROMAX) 250 MG tablet    Sig: Take two tablets on day one followed by one tablet on days 2-5    Dispense:  6 each    Refill:  0  . benzonatate (TESSALON) 100 MG capsule    Sig: Take 1 capsule (100 mg total) by mouth 3 (three) times daily as needed for cough.    Dispense:  30 capsule    Refill:  0   No orders of the defined types were placed in this encounter.   I discussed the assessment and treatment plan with the patient. The patient was provided an opportunity to ask questions and all were answered. The patient agreed with the plan and demonstrated an understanding of the instructions. The patient was advised to call back or seek an in-person evaluation if the symptoms worsen or if the condition fails to improve as anticipated.  Follow up plan: No follow-ups on file.  Judy Bush, MD

## 2020-06-09 NOTE — Telephone Encounter (Signed)
Patient called stating that she has had a cough for about 10 days. Patient stated that she was hoping that Dr. Sharen Hones would call her in a cough medication and an antibiotic. Patient stated that her cough is productive/yellow. Patient stated that she started out with hoarseness, runny nose and sore throat. Patient denies SOB or difficulty breathing. Patient scheduled for a virtual visit today with Dr. Sharen Hones at 4:00 pm. Patient was given ER precautions and she verbalized understanding.

## 2020-06-10 NOTE — Progress Notes (Signed)
CARDIOLOGY OFFICE NOTE  Date:  06/17/2020    Judy Walker Date of Birth: 12-19-1932 Medical Record #[id number]  PCP:  [name], MD  Cardiologist:  Rick Duff    Chief Complaint  Patient presents with  . Follow-up    Seen for Dr. Anne Fu    History of Present Illness: Judy Walker is a 87 y.o. female who presents today for a 4 month check. Seen for Dr. Anne Fu.  She has a history of HTN, HLD, mild AS noted on echo and PAF with prior cardioversion in 06/2019. FH + for early CAD.    Was admitted down at [facility] earlier in June - had GI bleed - HGB down to 5.5. She was transfused. 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 Started on PPI - Eliquis was stopped. Discharged with HGB of 8.9. Looks to have had AF as well. Was to have repeat EGD in 4 weeks which she did with Dr. [name]. Ended up getting Eliquis restarted.   Last seen in September by Dr. Anne Fu.  Felt to be doing ok. Rate was controlled.   Comes in today. Here alone. She feels well. No chest pain. Breathing is stable. She did have a cold/cough last week - this is all better. She is planning on getting back on her treadmill. She has a very positive attitude about life. No palpitations. Not dizzy. No bleeding. Recent labs noted. She has no real concerns and feels like she is doing well.    Past Medical History:  Diagnosis Date  . Anemia   . Atrial fibrillation (HCC)   . Benign positional vertigo 2005  . Colonoscopy refused    " I elected not to have one" SOC reviewed 07/06/10; she continues to decline colonscopy  . Heart murmur   . HLD (hyperlipidemia)    total cholesterol 340 in 2003, NHDL 61, TG 75. NMR Lipoprofile 2005: LDL 84 (933/285), LDL glad =<120 ideally <90. Framingham study LDL goal <130  . HTN (hypertension)   . Hypothyroidism   . Osteopenia 08/2012, 09/2015   DEXA -1.9 (2014) -2.3 (2017)    Past Surgical History:  Procedure Laterality Date  . ABDOMINAL  HYSTERECTOMY  2003   & BSO for cystic ovaries . No PMH of abnormal PAP  . CARDIAC CATHETERIZATION     normal  . CARDIOVERSION N/A 06/27/2019   Procedure: CARDIOVERSION;  Surgeon: [name], MD;  Location: [facility] ENDOSCOPY;  Service: Cardiovascular;  Laterality: N/A;  . CATARACT EXTRACTION, BILATERAL  09/2011   Dr.[name]   . CYSTOSCOPY     bladder @ age 30 for painful hematuria  . ESOPHAGOGASTRODUODENOSCOPY  12/2019   prepyloric scarring, 4cm HH, biopsies with healing ulcer, neg H pylori [name])     Medications: Current Meds  Medication Sig  . Ascorbic Acid (VITAMIN C GUMMIE) 120 MG CHEW Chew 250 mg by mouth daily.  Marland Kitchen atorvastatin (LIPITOR) 20 MG tablet Take 1 tablet (20 mg total) by mouth daily.  . Calcium Carbonate (CALCIUM 500 PO) Take 500 mg by mouth daily.  . Cholecalciferol (VITAMIN D3) 25 MCG (1000 UT) CAPS Take 1 capsule (1,000 Units total) by mouth daily.  Marland Kitchen diltiazem (CARDIZEM CD) 240 MG 24 hr capsule Take 1 capsule (240 mg total) by mouth daily.  . diphenhydrAMINE (BENADRYL) 25 mg capsule Take 25 mg by mouth at bedtime.  Marland Kitchen ELIQUIS 5 MG TABS tablet TAKE 1 TABLET BY MOUTH TWICE A DAY  . hydrochlorothiazide (  HYDRODIURIL) 25 MG tablet Take 1 tablet (25 mg total) by mouth daily.  Marland Kitchen levothyroxine (SYNTHROID) 75 MCG tablet Take 1 tablet (75 mcg total) by mouth daily.  Marland Kitchen losartan (COZAAR) 100 MG tablet Take 1 tablet (100 mg total) by mouth daily.  . Multiple Vitamins-Minerals (MULTIVITAMIN ADULT) TABS Take 1 tablet by mouth daily.  . pantoprazole (PROTONIX) 40 MG tablet Take 1 tablet (40 mg total) by mouth daily.  . potassium gluconate 595 (99 K) MG TABS tablet Take 595 mg by mouth daily.     Allergies: No Known Allergies  Social History: The patient  reports that she has never smoked. She has never used smokeless tobacco. She reports current alcohol use. She reports that she does not use drugs.   Family History: The patient's family history includes Alzheimer's  disease in her sister; Brain cancer in her brother; Breast cancer in her sister and sister; Cancer in her maternal grandmother; Cirrhosis in her sister; Diabetes in her father, paternal grandfather, and another family member; Heart attack in her brother, brother, and father; Heart disease in her brother; Kidney cancer in her mother; Other in her brother and brother; Ovarian cancer in her sister; Stroke in her sister; Ulcerative colitis in her son.   Review of Systems: Please see the history of present illness.   All other systems are reviewed and negative.   Physical Exam: VS:  BP 124/80   Pulse 83   Ht 5' 6.5" (1.689 m)   Wt 156 lb (70.8 kg)   SpO2 99%   BMI 24.80 kg/m  .  BMI Body mass index is 24.8 kg/m.  Wt Readings from Last 3 Encounters:  06/17/20 156 lb (70.8 kg)  05/15/20 157 lb 8 oz (71.4 kg)  02/13/20 156 lb (70.8 kg)    General: Alert and in no acute distress. She looks younger than her stated age.    Cardiac: Irregular irregular rhythm. Her rate is fine. No edema.  Respiratory:  Lungs are clear to auscultation bilaterally with normal work of breathing.  GI: Soft and nontender.  MS: No deformity or atrophy. Gait and ROM intact.  Skin: Warm and dry. Color is normal.  Neuro:  Strength and sensation are intact and no gross focal deficits noted.  Psych: Alert, appropriate and with normal affect.   LABORATORY DATA:  EKG:  EKG is not ordered today.    Lab Results  Component Value Date   WBC 4.7 05/15/2020   HGB 13.7 05/15/2020   HCT 40.5 05/15/2020   PLT 236.0 05/15/2020   GLUCOSE 93 05/15/2020   CHOL 200 05/15/2020   TRIG 135.0 05/15/2020   HDL 62.40 05/15/2020   LDLDIRECT 97.0 09/10/2015   LDLCALC 111 (H) 05/15/2020   ALT 14 05/15/2020   AST 15 05/15/2020   NA 140 05/15/2020   K 4.1 05/15/2020   CL 99 05/15/2020   CREATININE 1.04 05/15/2020   BUN 24 (H) 05/15/2020   CO2 31 05/15/2020   TSH 2.47 05/15/2020   MICROALBUR 0.7 03/26/2019     BNP (last 3  results) No results for input(s): BNP in the last 8760 hours.  ProBNP (last 3 results) No results for input(s): PROBNP in the last 8760 hours.   Other Studies Reviewed Today:  ECHO IMPRESSIONS 05/2019  1. Left ventricular ejection fraction, by visual estimation, is 60 to  65%. The left ventricle has normal function. There is mildly increased  left ventricular hypertrophy.  2. Left ventricular diastolic parameters are indeterminate.  3.  Global right ventricle has normal systolic function.The right  ventricular size is normal. No increase in right ventricular wall  thickness.  4. Left atrial size was normal.  5. Right atrial size was mild-moderately dilated.  6. Mild mitral annular calcification.  7. The mitral valve is abnormal. Mild mitral valve regurgitation. No  evidence of mitral stenosis.  8. The tricuspid valve is normal in structure. Tricuspid valve  regurgitation mild-moderate.  9. The aortic valve is abnormal. Aortic valve regurgitation is trivial.  Mild aortic valve stenosis. Moderate reduced leaflet excursion.  10. Aortic valve mean gradient measures 10.3 mmHg.  11. The pulmonic valve was normal in structure. Pulmonic valve  regurgitation is trivial.  12. Normal pulmonary artery systolic pressure.  13. The inferior vena cava is normal in size with greater than 50%  respiratory variability, suggesting right atrial pressure of 3 mmHg.  14. The left ventricle has no regional wall motion abnormalities.    ASSESSMENT & PLAN:     1. Persistent AF - she is managed with rate control - no plan to pursue rhythm control strategy - she is doing well. No problems noted. Her current regimen will be continued.   2. HTN - BP is fine on her current regimen.   3. Prior GI bleed - follow up EGD showed ulcers had healed. This has not recurred.   4. Mild AS - consider updating echo on return - she has no cardinal symptoms.   5. Chronic anticoagulation -  On Eliquis - dose  is appropriate - recent labs noted.   6. HLD - on statin therapy. Recent labs noted.    7. Hypothyroidism - per PCP - not discussed.   Current medicines are reviewed with the patient today.  The patient does not have concerns regarding medicines other than what has been noted above.  The following changes have been made:  See above.  Labs/ tests ordered today include:   No orders of the defined types were placed in this encounter.    Disposition:   FU with Dr. Marlou Porch in 6 months. No change with her current therapy - she is felt to be doing well.   Patient is agreeable to this plan and will call if any problems develop in the interim.   SignedTruitt Merle, NP  06/17/2020 12:04 PM  [place] [address] [place], [place]  [address] Phone: [phone] Fax: [phone]

## 2020-06-17 ENCOUNTER — Ambulatory Visit: Payer: Medicare Other | Admitting: Nurse Practitioner

## 2020-06-17 ENCOUNTER — Other Ambulatory Visit: Payer: Self-pay

## 2020-06-17 ENCOUNTER — Encounter: Payer: Self-pay | Admitting: Nurse Practitioner

## 2020-06-17 VITALS — BP 124/80 | HR 83 | Ht 66.5 in | Wt 156.0 lb

## 2020-06-17 DIAGNOSIS — I350 Nonrheumatic aortic (valve) stenosis: Secondary | ICD-10-CM

## 2020-06-17 DIAGNOSIS — I4819 Other persistent atrial fibrillation: Secondary | ICD-10-CM | POA: Diagnosis not present

## 2020-06-17 DIAGNOSIS — I10 Essential (primary) hypertension: Secondary | ICD-10-CM

## 2020-06-17 NOTE — Patient Instructions (Addendum)
After Visit Summary:  We will be checking the following labs today - NONE   Medication Instructions:    Continue with your current medicines.    If you need a refill on your cardiac medications before your next appointment, please call your pharmacy.     Testing/Procedures To Be Arranged:  N/A  Follow-Up:   See Dr. Marlou Porch in about 6 months.  You will receive a reminder letter in the mail two months in advance. If you don't receive a letter, please call our office to schedule the follow-up appointment.    At [facility], you and your health needs are our priority.  As part of our continuing mission to provide you with exceptional heart care, we have created designated Provider Care Teams.  These Care Teams include your primary Cardiologist (physician) and Advanced Practice Providers (APPs -  Physician Assistants and Nurse Practitioners) who all work together to provide you with the care you need, when you need it.  Special Instructions:  . Stay safe, wash your hands for at least 20 seconds and wear a mask when needed.  . It was good to talk with you today.    Call the [place] office at [phone] if you have any questions, problems or concerns.

## 2020-08-18 ENCOUNTER — Telehealth: Payer: Self-pay

## 2020-08-18 MED ORDER — LOSARTAN POTASSIUM 50 MG PO TABS: 100.0000 mg | ORAL_TABLET | Freq: Every day | ORAL | 0 refills | Status: DC

## 2020-08-18 NOTE — Addendum Note (Signed)
Addended by: Ria Bush on: 08/18/2020 02:05 PM   Modules accepted: Orders

## 2020-08-18 NOTE — Telephone Encounter (Signed)
Received faxed message from [facility] stating losartan 100 mg is on backorder.  But they do have 50 mg tabs.  They are requesting an alternative rx, if appropriate.

## 2020-09-27 IMAGING — CT CT ABDOMEN W/ CM
3 of 5 series · 12 of 46 positions shown, 17 images · IV contrast (OMNIPAQUE)
Comparison: None

CLINICAL DATA: Recent admission for gastric ulcer and gastric mass.

EXAM:
CT ABDOMEN WITH CONTRAST
TECHNIQUE: Multidetector CT imaging of the abdomen was performed using the
standard protocol following bolus administration of intravenous
contrast.
CONTRAST:  100mL OMNIPAQUE IOHEXOL 300 MG/ML  SOLN

[Series 2: axial st · axial · 0.71mm/px · z∈[-417,-252]mm · 8 of 43 slices shown, 13 images]
[im 5/43  soft-tissue]
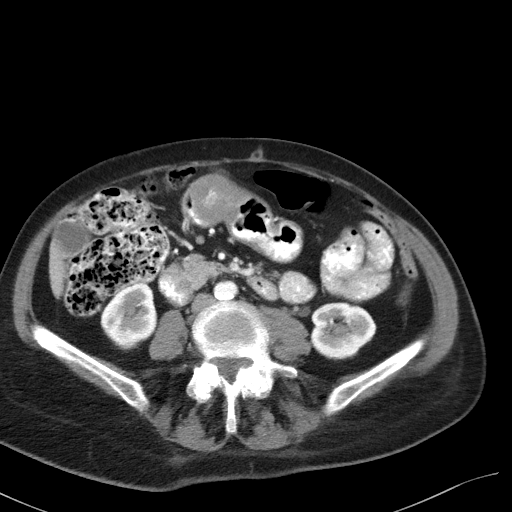
[im 5/43  bone]
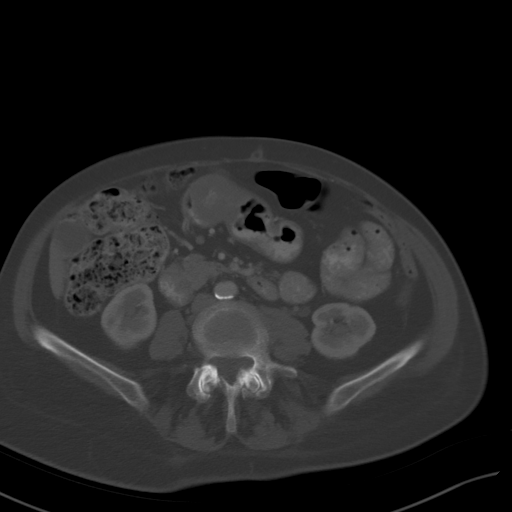
[im 10/43  soft-tissue]
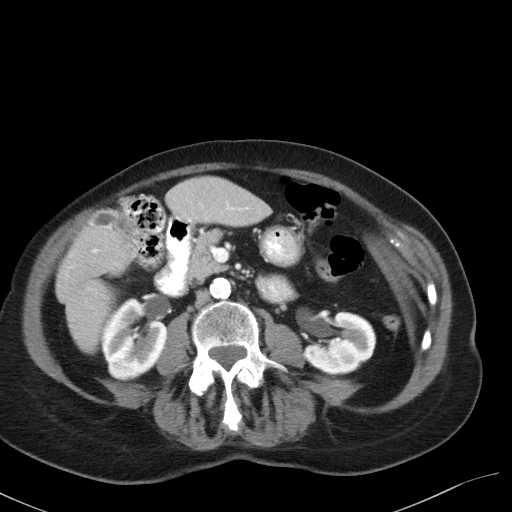
[im 15/43  soft-tissue]
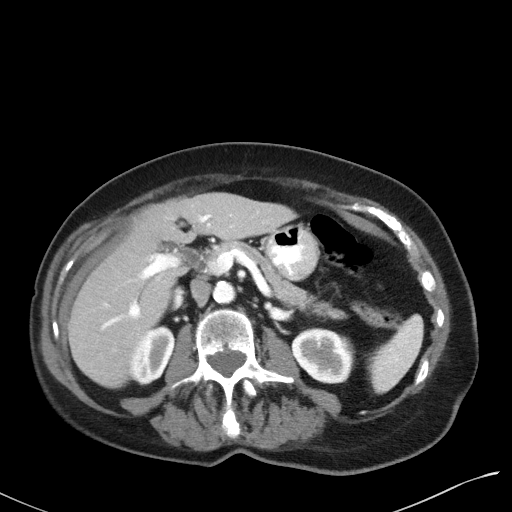
[im 19/43  soft-tissue]
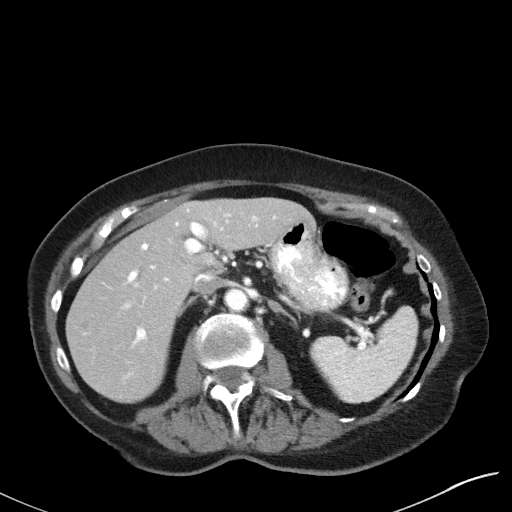
[im 24/43  soft-tissue]
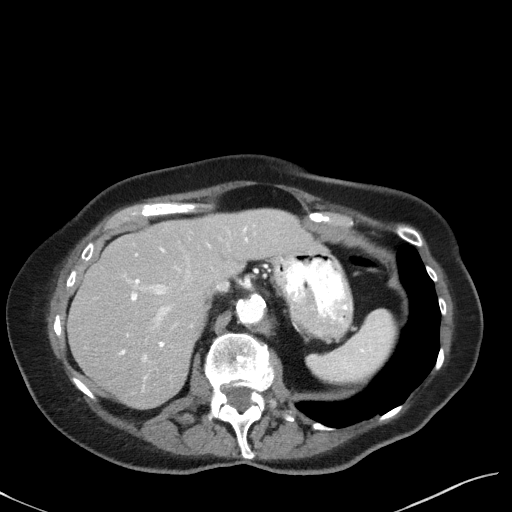
[im 24/43  lung]
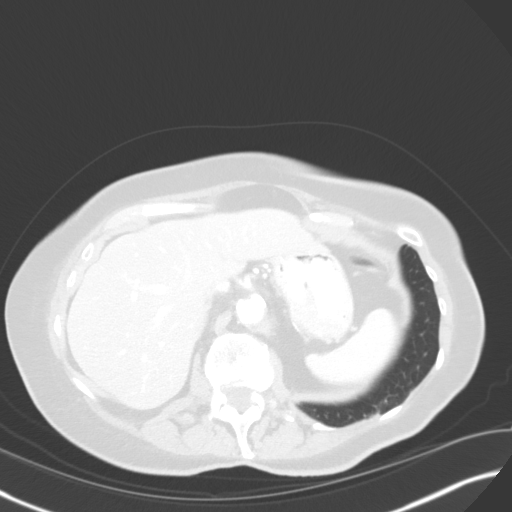
[im 29/43  soft-tissue]
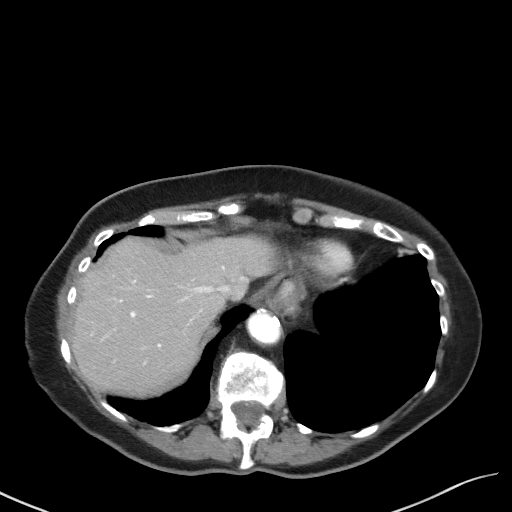
[im 29/43  lung]
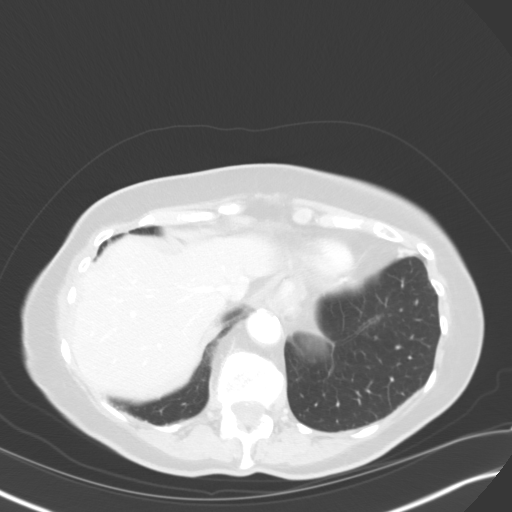
[im 33/43  soft-tissue]
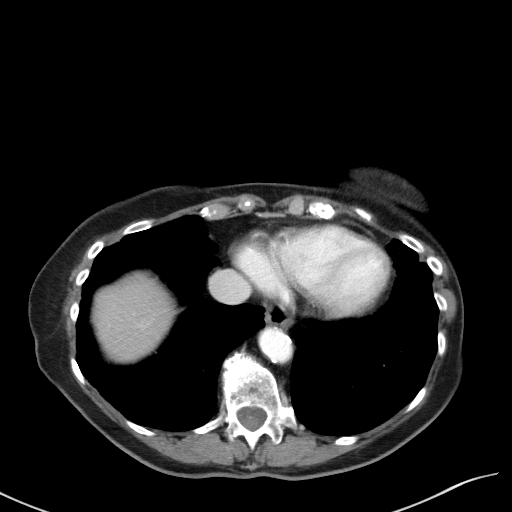
[im 33/43  lung]
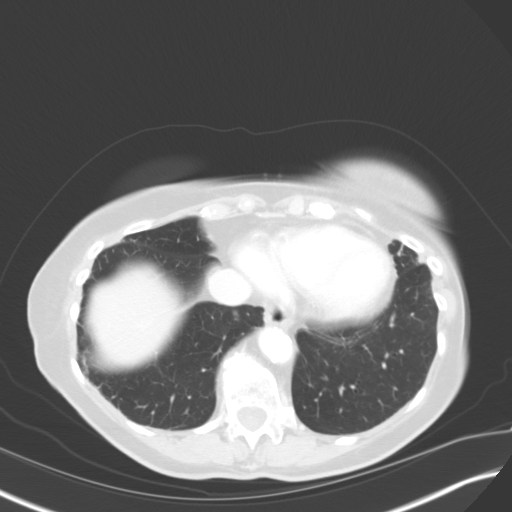
[im 38/43  soft-tissue]
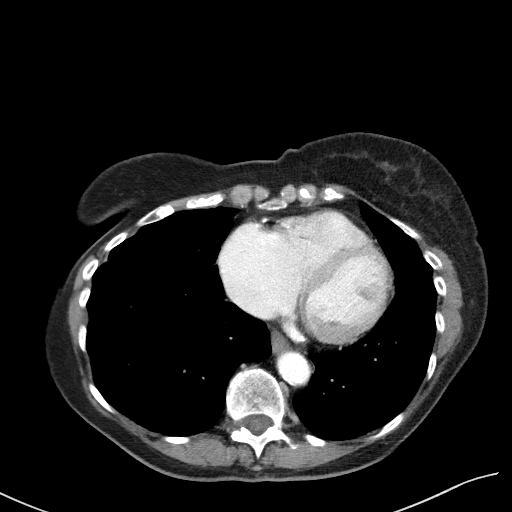
[im 38/43  lung]
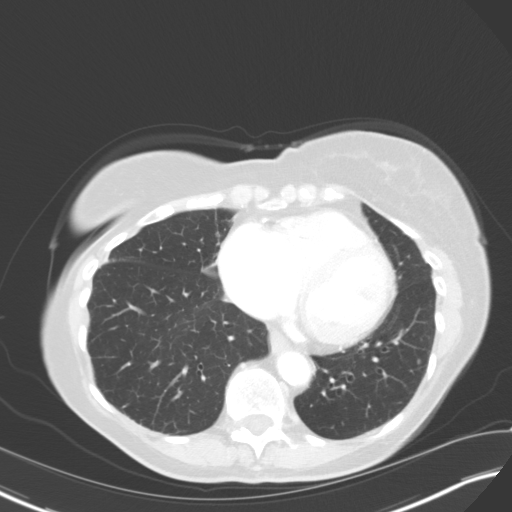

[Series 4: coronal st · coronal · 0.53mm/px · 3 of 74 slices shown]
[im 25/74  soft-tissue]
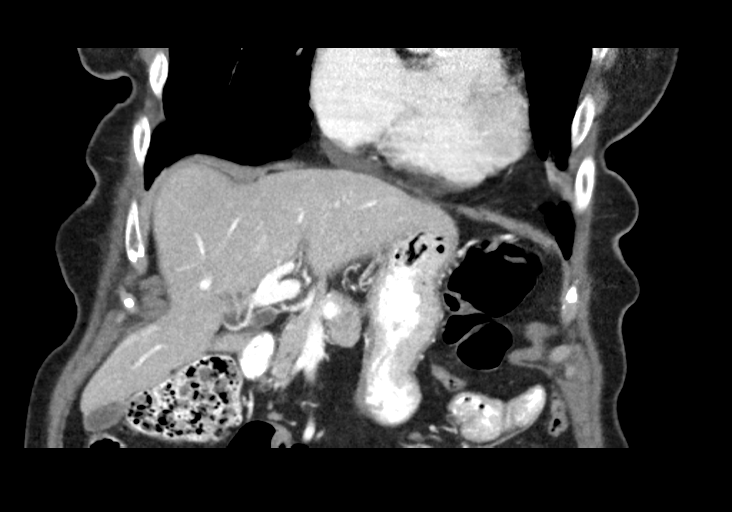
[im 33/74  soft-tissue]
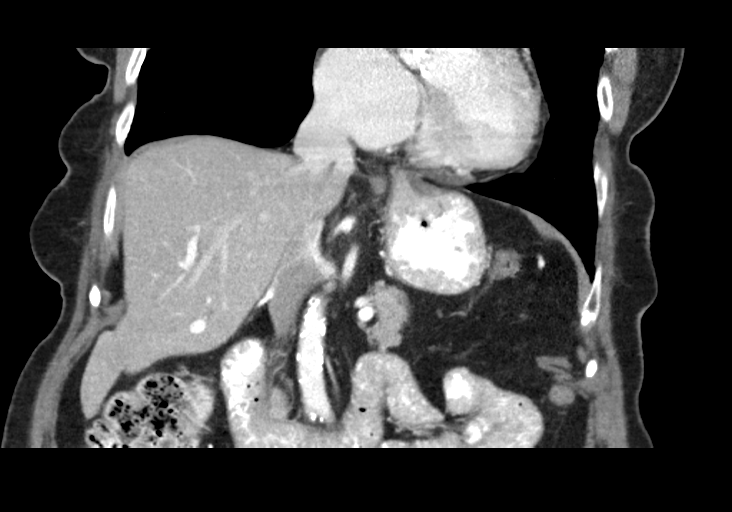
[im 41/74  soft-tissue]
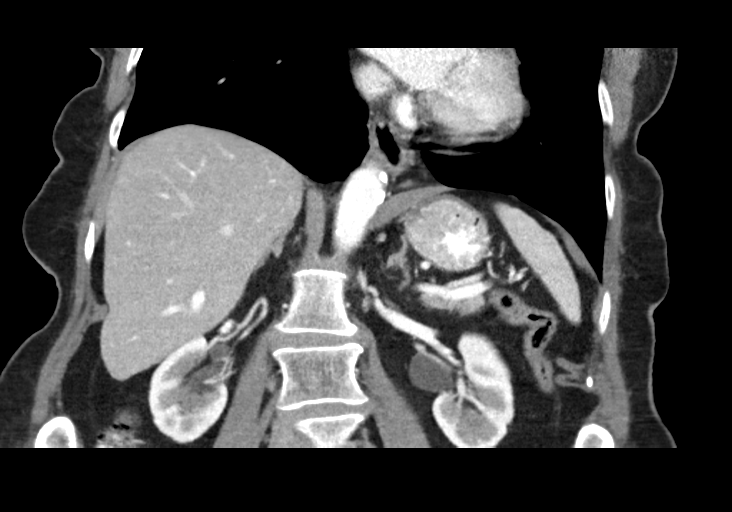

[Series 5: sagittal st · sagittal · 0.48mm/px · 1 of 107 slices shown]
[im 36/107  soft-tissue]
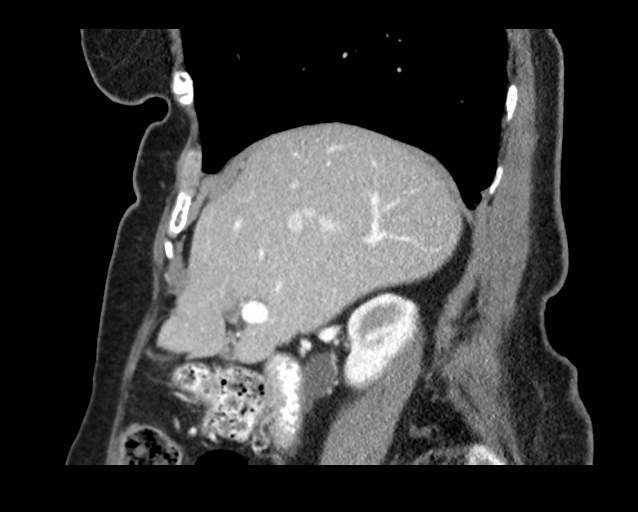

[12 of 46 positions shown; findings below may reference images not displayed]

FINDINGS: Lower chest: No acute abnormality.

Hepatobiliary: No focal liver abnormality is seen. No gallstones,
gallbladder wall thickening, or biliary dilatation.

Pancreas: Unremarkable. No pancreatic ductal dilatation or
surrounding inflammatory changes.

Spleen: Normal in size without focal abnormality.

Adrenals/Urinary Tract: Normal appearance of the adrenal glands. No
kidney stone, mass or hydronephrosis.

Stomach/Bowel: Small hiatal hernia. There is circumferential wall
thickening involving the antropyloric junction. No disturbed a multi
scratch set no discernible gastric mass identified. The imaged
portions of the small and large bowel loops are unremarkable. No
bowel wall thickening, inflammation or distension noted.

Vascular/Lymphatic: Aortic atherosclerosis without aneurysm. No
abdominal adenopathy.

Other: No free fluid or fluid collections.  No free air.

Musculoskeletal: Mild thoracolumbar curvature with multilevel
degenerative disc disease. No acute or suspicious osseous findings.
IMPRESSION: 1. There is circumferential wall thickening involving the
antropyloric junction. No discernible gastric mass identified.
Correlation with upper endoscopy findings advised.
2. Small hiatal hernia.
3. Aortic atherosclerosis.

Aortic Atherosclerosis ([id number]).

## 2020-11-10 ENCOUNTER — Other Ambulatory Visit: Payer: Self-pay | Admitting: Physician Assistant

## 2020-11-11 ENCOUNTER — Other Ambulatory Visit: Payer: Self-pay | Admitting: Cardiology

## 2020-11-11 NOTE — Telephone Encounter (Signed)
Pt last saw Truitt Merle, NP on 06/17/20, last labs 05/15/20 Creat 1.04, age 87, weight 70.8kg, based on specified criteria pt is on appropriate dosage of Eliquis 5mg  BID.  Will refill rx.

## 2020-11-13 ENCOUNTER — Telehealth: Payer: Self-pay | Admitting: Family Medicine

## 2020-11-13 DIAGNOSIS — I6523 Occlusion and stenosis of bilateral carotid arteries: Secondary | ICD-10-CM

## 2020-11-13 NOTE — Telephone Encounter (Signed)
Due for updated carotid US - I have ordered.

## 2020-11-16 ENCOUNTER — Other Ambulatory Visit: Payer: Self-pay | Admitting: Family Medicine

## 2020-11-18 MED ORDER — LOSARTAN POTASSIUM 100 MG PO TABS: 100.0000 mg | ORAL_TABLET | Freq: Every day | ORAL | 1 refills | Status: DC

## 2020-11-18 NOTE — Telephone Encounter (Addendum)
Per TE, 08/18/20, losartan 100 mg was on backorder.  So 50 mg was prescribed for pt to take BID.    Spoke with [facility] asking if 100 mg was back in stock.  Told yes, it's in stock.   Denied 50 mg refill request.  E-scribed 100 mg tab.  Spoke with pt making aware and expresses her thanks.  FYI to Dr. Darnell Level.

## 2020-11-18 NOTE — Telephone Encounter (Signed)
Agree. Thanks

## 2020-12-09 ENCOUNTER — Other Ambulatory Visit: Payer: Self-pay

## 2020-12-09 ENCOUNTER — Ambulatory Visit (HOSPITAL_COMMUNITY)
Admission: RE | Admit: 2020-12-09 | Discharge: 2020-12-09 | Disposition: A | Discharge status: Home / Self Care | Payer: Medicare Other | Source: Ambulatory Visit | Attending: Internal Medicine | Admitting: Internal Medicine

## 2020-12-09 DIAGNOSIS — I6523 Occlusion and stenosis of bilateral carotid arteries: Secondary | ICD-10-CM | POA: Insufficient documentation

## 2021-01-07 ENCOUNTER — Other Ambulatory Visit: Payer: Self-pay | Admitting: *Deleted

## 2021-01-07 MED ORDER — DILTIAZEM HCL ER COATED BEADS 240 MG PO CP24: 240.0000 mg | ORAL_CAPSULE | Freq: Every day | ORAL | 3 refills | Status: DC

## 2021-01-15 ENCOUNTER — Other Ambulatory Visit: Payer: Self-pay

## 2021-01-15 ENCOUNTER — Ambulatory Visit: Payer: Medicare Other | Admitting: Cardiology

## 2021-01-15 ENCOUNTER — Encounter: Payer: Self-pay | Admitting: Cardiology

## 2021-01-15 VITALS — BP 120/70 | HR 83 | Ht 66.5 in | Wt 157.0 lb

## 2021-01-15 DIAGNOSIS — Z8673 Personal history of transient ischemic attack (TIA), and cerebral infarction without residual deficits: Secondary | ICD-10-CM

## 2021-01-15 DIAGNOSIS — I6523 Occlusion and stenosis of bilateral carotid arteries: Secondary | ICD-10-CM | POA: Diagnosis not present

## 2021-01-15 DIAGNOSIS — I10 Essential (primary) hypertension: Secondary | ICD-10-CM | POA: Diagnosis not present

## 2021-01-15 DIAGNOSIS — I350 Nonrheumatic aortic (valve) stenosis: Secondary | ICD-10-CM | POA: Diagnosis not present

## 2021-01-15 DIAGNOSIS — I480 Paroxysmal atrial fibrillation: Secondary | ICD-10-CM

## 2021-01-15 DIAGNOSIS — Z7901 Long term (current) use of anticoagulants: Secondary | ICD-10-CM | POA: Diagnosis not present

## 2021-01-15 DIAGNOSIS — I4821 Permanent atrial fibrillation: Secondary | ICD-10-CM | POA: Diagnosis not present

## 2021-01-15 DIAGNOSIS — E7800 Pure hypercholesterolemia, unspecified: Secondary | ICD-10-CM | POA: Diagnosis not present

## 2021-01-15 NOTE — Assessment & Plan Note (Signed)
Continue with Eliquis. No bleeding.

## 2021-01-15 NOTE — Assessment & Plan Note (Signed)
Mild carotid disease bilaterally on vascular ultrasound 7/22 personally reviewed.

## 2021-01-15 NOTE — Patient Instructions (Signed)
Medication Instructions:  The current medical regimen is effective;  continue present plan and medications.  *If you need a refill on your cardiac medications before your next appointment, please call your pharmacy*  Testing/Procedures: Your physician has requested that you have an echocardiogram. Echocardiography is a painless test that uses sound waves to create images of your heart. It provides your doctor with information about the size and shape of your heart and how well your heart's chambers and valves are working. This procedure takes approximately one hour. There are no restrictions for this procedure.  Follow-Up: At [facility], you and your health needs are our priority.  As part of our continuing mission to provide you with exceptional heart care, we have created designated Provider Care Teams.  These Care Teams include your primary Cardiologist (physician) and Advanced Practice Providers (APPs -  Physician Assistants and Nurse Practitioners) who all work together to provide you with the care you need, when you need it.  We recommend signing up for the patient portal called "MyChart".  Sign up information is provided on this After Visit Summary.  MyChart is used to connect with patients for Virtual Visits (Telemedicine).  Patients are able to view lab/test results, encounter notes, upcoming appointments, etc.  Non-urgent messages can be sent to your provider as well.   To learn more about what you can do with MyChart, go to [email or web address].    Your next appointment:   6 month(s)  The format for your next appointment:   In Person  Provider:   You may see one of the following Advanced Practice Providers on your designated Care Team:   Cecilie Kicks, NP or any other NP/PA in 6 months and Dr Marlou Porch in 1 yr.   Thank you for choosing [facility]!!

## 2021-01-15 NOTE — Assessment & Plan Note (Signed)
Continue with atorvastatin 20 mg.  Last LDL 111.  May be beneficial to increase this to 40 mg to decrease her LDL.

## 2021-01-15 NOTE — Assessment & Plan Note (Signed)
Repeating echocardiogram.  Moderate aortic stenosis previously described.  EF 55 to 60%

## 2021-01-15 NOTE — Assessment & Plan Note (Signed)
Currently under good rate control.  Cardioversion did not last after attempt in January 2022.  Continue with diltiazem 240 mg daily.  No medical changes made.

## 2021-01-15 NOTE — Assessment & Plan Note (Signed)
CT scan done at [facility] 11/2019 with signs of old lacunar strokes.  She did not have any recollection of prior strokelike symptoms.

## 2021-01-15 NOTE — Progress Notes (Signed)
Cardiology Office Note:    Date:  01/15/2021   ID:  Judy Walker, DOB 10/15/1932, MRN [id number]  PCP:  Ria Bush, MD  [facility] Cardiologist:  [name], MD  [facility] Electrophysiologist:  None   Referring MD: Ria Bush, MD    History of Present Illness:    Judy Walker is a 87 y.o. female here for the follow-up of atrial fibrillation, AS, and hypertension.   She had cardioversion 1/21.  At her visit 08/22/2019 she was back in atrial fibrillation. Has had GI bleed.  EGD has been performed.  She was last seen 06/17/2020 by Truitt Merle, NP for a follow-up. At that visit she was doing well and no medication changes were made.   Today: Overall, she appears well and has no new complaints.   She denies ever having a syncopal episode, but endorses occasional heat intolerance that will make her feel ill and force her to sit down.  For activity, she enjoys sitting on the porch, reading a book, and listening to the ocean.  She denies any palpitations, chest pain, or shortness of breath. No headaches, orthopnea, or PND. Also has no lower extremity edema or exertional symptoms.   Past Medical History:  Diagnosis Date   Anemia    Atrial fibrillation ([place])    Benign positional vertigo 2005   Colonoscopy refused    " I elected not to have one" [place] reviewed 07/06/10; she continues to decline colonscopy   Heart murmur    HLD (hyperlipidemia)    total cholesterol 340 in 2003, NHDL 61, TG 75. NMR Lipoprofile 2005: LDL 84 (933/285), LDL glad =<120 ideally <90. Framingham study LDL goal <130   HTN (hypertension)    Hypothyroidism    Osteopenia 08/2012, 09/2015   DEXA -1.9 (2014) -2.3 (2017)    Past Surgical History:  Procedure Laterality Date   ABDOMINAL HYSTERECTOMY  2003   & BSO for cystic ovaries . No PMH of abnormal PAP   CARDIAC CATHETERIZATION     normal   CARDIOVERSION N/A 06/27/2019   Procedure: CARDIOVERSION;  Surgeon: Jerline Pain, MD;   Location: [facility];  Service: Cardiovascular;  Laterality: N/A;   CATARACT EXTRACTION, BILATERAL  09/2011   Dr.[name]    CYSTOSCOPY     bladder @ age 68 for painful hematuria   ESOPHAGOGASTRODUODENOSCOPY  12/2019   prepyloric scarring, 4cm HH, biopsies with healing ulcer, neg H pylori Carlean Purl)    Current Medications: Current Meds  Medication Sig   Ascorbic Acid (VITAMIN C GUMMIE) 120 MG CHEW Chew 250 mg by mouth daily.   atorvastatin (LIPITOR) 20 MG tablet Take 1 tablet (20 mg total) by mouth daily.   Calcium Carbonate (CALCIUM 500 PO) Take 500 mg by mouth daily.   Cholecalciferol (VITAMIN D3) 25 MCG (1000 UT) CAPS Take 1 capsule (1,000 Units total) by mouth daily.   diltiazem (CARDIZEM CD) 240 MG 24 hr capsule Take 1 capsule (240 mg total) by mouth daily.   diphenhydrAMINE (BENADRYL) 25 mg capsule Take 25 mg by mouth at bedtime.   ELIQUIS 5 MG TABS tablet TAKE 1 TABLET BY MOUTH TWICE A DAY   hydrochlorothiazide (HYDRODIURIL) 25 MG tablet Take 1 tablet (25 mg total) by mouth daily.   levothyroxine (SYNTHROID) 75 MCG tablet Take 1 tablet (75 mcg total) by mouth daily.   losartan (COZAAR) 100 MG tablet Take 1 tablet (100 mg total) by mouth daily.   Multiple Vitamins-Minerals (MULTIVITAMIN ADULT) TABS Take 1 tablet by  mouth daily.   pantoprazole (PROTONIX) 40 MG tablet TAKE 1 TABLET BY MOUTH EVERY DAY   potassium gluconate 595 (99 K) MG TABS tablet Take 595 mg by mouth daily.     Allergies:   Patient has no known allergies.   Social History   Socioeconomic History   Marital status: Widowed    Spouse name: Not on file   Number of children: 2   Years of education: Not on file   Highest education level: Not on file  Occupational History   Occupation: retired  Tobacco Use   Smoking status: Never   Smokeless tobacco: Never  Vaping Use   Vaping Use: Never used  Substance and Sexual Activity   Alcohol use: Yes    Comment:  RARELY   Drug use: No   Sexual activity: Not  on file  Other Topics Concern   Not on file  Social History Narrative   Lives alone. Widow 2012   Daughter lives nearby.   Occupation: retired Clinical cytogeneticist   Was Programmer, multimedia growing up   Activity: Regular exercise - walking 1-2 miles daily, stays active in yard.   Diet: daily fruits/vegetables, some water   Social Determinants of Health   Financial Resource Strain: Low Risk    Difficulty of Paying Living Expenses: Not hard at all  Food Insecurity: No Food Insecurity   Worried About Charity fundraiser in the Last Year: Never true   Ran Out of Food in the Last Year: Never true  Transportation Needs: No Transportation Needs   Lack of Transportation (Medical): No   Lack of Transportation (Non-Medical): No  Physical Activity: Insufficiently Active   Days of Exercise per Week: 7 days   Minutes of Exercise per Session: 10 min  Stress: No Stress Concern Present   Feeling of Stress : Not at all  Social Connections: Not on file     Family History: The patient's family history includes Alzheimer's disease in her sister; Brain cancer in her brother; Breast cancer in her sister and sister; Cancer in her maternal grandmother; Cirrhosis in her sister; Diabetes in her father, paternal grandfather, and another family member; Heart attack in her brother, brother, and father; Heart disease in her brother; Kidney cancer in her mother; Other in her brother and brother; Ovarian cancer in her sister; Stroke in her sister; Ulcerative colitis in her son.  ROS:   Please see the history of present illness.    (+) Occasional heat intolerance All other systems reviewed and are negative.  EKGs/Labs/Other Studies Reviewed:    US Carotid Duplex 12/09/2020: Summary:  Right Carotid: Velocities in the right ICA are consistent with a 1-39%  stenosis.   Left Carotid: Velocities in the left ICA are consistent with a 1-39%  stenosis.   Vertebrals:  Bilateral vertebral arteries demonstrate antegrade  flow.  Subclavians: Normal flow hemodynamics were seen in bilateral subclavian arteries.   Echo 05/15/2019: 1. Left ventricular ejection fraction, by visual estimation, is 60 to  65%. The left ventricle has normal function. There is mildly increased  left ventricular hypertrophy.   2. Left ventricular diastolic parameters are indeterminate.   3. Global right ventricle has normal systolic function.The right  ventricular size is normal. No increase in right ventricular wall  thickness.   4. Left atrial size was normal.   5. Right atrial size was mild-moderately dilated.   6. Mild mitral annular calcification.   7. The mitral valve is abnormal. Mild mitral valve regurgitation.  No  evidence of mitral stenosis.   8. The tricuspid valve is normal in structure. Tricuspid valve  regurgitation mild-moderate.   9. The aortic valve is abnormal. Aortic valve regurgitation is trivial.  Mild aortic valve stenosis. Moderate reduced leaflet excursion.  10. Aortic valve mean gradient measures 10.3 mmHg.  11. The pulmonic valve was normal in structure. Pulmonic valve  regurgitation is trivial.  12. Normal pulmonary artery systolic pressure.  13. The inferior vena cava is normal in size with greater than 50%  respiratory variability, suggesting right atrial pressure of 3 mmHg.  14. The left ventricle has no regional wall motion abnormalities.   EKG:   01/15/2021: Atrial fibrillation. Rate 83 bpm. 02/13/2020: atrial fibrillation, heart rate 86 bpm, better rate control when compared to prior.  Recent Labs: 05/15/2020: ALT 14; BUN 24; Creatinine, Ser 1.04; Hemoglobin 13.7; Platelets 236.0; Potassium 4.1; Sodium 140; TSH 2.47  Recent Lipid Panel    Component Value Date/Time   CHOL 200 05/15/2020 1233   TRIG 135.0 05/15/2020 1233   TRIG 63 05/17/2006 1034   HDL 62.40 05/15/2020 1233   CHOLHDL 3 05/15/2020 1233   VLDL 27.0 05/15/2020 1233   LDLCALC 111 (H) 05/15/2020 1233   LDLDIRECT 97.0 09/10/2015  1158    Physical Exam:    VS:  BP 120/70 (BP Location: Left Arm, Patient Position: Sitting, Cuff Size: Normal)   Pulse 83   Ht 5' 6.5" (1.689 m)   Wt 157 lb (71.2 kg)   SpO2 97%   BMI 24.96 kg/m     Wt Readings from Last 3 Encounters:  01/15/21 157 lb (71.2 kg)  06/17/20 156 lb (70.8 kg)  05/15/20 157 lb 8 oz (71.4 kg)    GEN: Well nourished, well developed in no acute distress HEENT: Normal NECK: No JVD; No carotid bruits LYMPHATICS: No lymphadenopathy CARDIAC: Irregularly irregular, 2/6 systolic murmur, no rubs, gallops RESPIRATORY:  Clear to auscultation without rales, wheezing or rhonchi  ABDOMEN: Soft, non-tender, non-distended MUSCULOSKELETAL:  No edema; No deformity  SKIN: Warm and dry NEUROLOGIC:  Alert and oriented x 3 PSYCHIATRIC:  Normal affect    ASSESSMENT:    1. Paroxysmal atrial fibrillation (HCC)   2. Essential hypertension   3. History of ischemic stroke   4. Moderate aortic stenosis by prior echocardiogram   5. Carotid stenosis, bilateral   6. Pure hypercholesterolemia   7. Permanent atrial fibrillation ([place])   8. Chronic anticoagulation     PLAN:   In order of problems listed above: History of ischemic stroke CT scan done at [facility] 11/2019 with signs of old lacunar strokes.  She did not have any recollection of prior strokelike symptoms.  Moderate aortic stenosis by prior echocardiogram Repeating echocardiogram.  Moderate aortic stenosis previously described.  EF 55 to 60%  Carotid stenosis, bilateral Mild carotid disease bilaterally on vascular ultrasound 7/22 personally reviewed.  HLD (hyperlipidemia) Continue with atorvastatin 20 mg.  Last LDL 111.  May be beneficial to increase this to 40 mg to decrease her LDL.  Permanent atrial fibrillation (HCC) Currently under good rate control.  Cardioversion did not last after attempt in January 2022.  Continue with diltiazem 240 mg daily.  No medical changes made.  Chronic  anticoagulation Continue with Eliquis. No bleeding.   Prior GI bleed -Hemoglobin 5.5, transfusion.  EGD report reviewed, ulcers have healed.  She does have a hiatal hernia.  Her Protonix has been cut back.  Continue per GI.  She did not take NSAIDs  or heavy alcohol use.  Follow-up: 6 months with APP. 1 year with me.  Medication Adjustments/Labs and Tests Ordered: Current medicines are reviewed at length with the patient today.  Concerns regarding medicines are outlined above.   Orders Placed This Encounter  Procedures   EKG 12-Lead   ECHOCARDIOGRAM COMPLETE    No orders of the defined types were placed in this encounter.  Patient Instructions  Medication Instructions:  The current medical regimen is effective;  continue present plan and medications.  *If you need a refill on your cardiac medications before your next appointment, please call your pharmacy*  Testing/Procedures: Your physician has requested that you have an echocardiogram. Echocardiography is a painless test that uses sound waves to create images of your heart. It provides your doctor with information about the size and shape of your heart and how well your heart's chambers and valves are working. This procedure takes approximately one hour. There are no restrictions for this procedure.  Follow-Up: At [facility], you and your health needs are our priority.  As part of our continuing mission to provide you with exceptional heart care, we have created designated Provider Care Teams.  These Care Teams include your primary Cardiologist (physician) and Advanced Practice Providers (APPs -  Physician Assistants and Nurse Practitioners) who all work together to provide you with the care you need, when you need it.  We recommend signing up for the patient portal called "MyChart".  Sign up information is provided on this After Visit Summary.  MyChart is used to connect with patients for Virtual Visits (Telemedicine).  Patients  are able to view lab/test results, encounter notes, upcoming appointments, etc.  Non-urgent messages can be sent to your provider as well.   To learn more about what you can do with MyChart, go to [email or web address].    Your next appointment:   6 month(s)  The format for your next appointment:   In Person  Provider:   You may see one of the following Advanced Practice Providers on your designated Care Team:   Cecilie Kicks, NP or any other NP/PA in 6 months and Dr Marlou Porch in 1 yr.   Thank you for choosing [facility]!!     I,Mathew Stumpf,acting as a scribe for [name], MD.,have documented all relevant documentation on the behalf of [name], MD,as directed by  [name], MD while in the presence of [name], MD.  I, [name], MD, have reviewed all documentation for this visit. The documentation on 01/15/21 for the exam, diagnosis, procedures, and orders are all accurate and complete.   Signed, [name], MD  01/15/2021 10:26 AM    [facility]

## 2021-02-10 ENCOUNTER — Ambulatory Visit (HOSPITAL_COMMUNITY): Payer: Medicare Other | Attending: Cardiology

## 2021-02-10 ENCOUNTER — Other Ambulatory Visit: Payer: Self-pay

## 2021-02-10 DIAGNOSIS — I480 Paroxysmal atrial fibrillation: Secondary | ICD-10-CM | POA: Diagnosis not present

## 2021-02-10 DIAGNOSIS — I10 Essential (primary) hypertension: Secondary | ICD-10-CM | POA: Diagnosis not present

## 2021-02-10 LAB — ECHOCARDIOGRAM COMPLETE
AR max vel: 1.06 cm2
AV Area VTI: 1.07 cm2
AV Area mean vel: 1.07 cm2
AV Mean grad: 12.4 mmHg
AV Peak grad: 22.2 mmHg
Ao pk vel: 2.36 m/s
Area-P 1/2: 2.96 cm2
S' Lateral: 2 cm

## 2021-02-19 ENCOUNTER — Telehealth: Payer: Self-pay | Admitting: *Deleted

## 2021-02-19 ENCOUNTER — Telehealth: Payer: Self-pay | Admitting: Cardiology

## 2021-02-19 DIAGNOSIS — I340 Nonrheumatic mitral (valve) insufficiency: Secondary | ICD-10-CM

## 2021-02-19 NOTE — Telephone Encounter (Signed)
Pt is returning phone call for results...Marland Kitchen please advise.

## 2021-02-19 NOTE — Telephone Encounter (Signed)
Aortic valve stenosis appears mild however mitral valve regurgitation now appears moderate to severe.   Her symptoms seem quite minimal, however I would like for her to discuss with our structural heart clinic.  I wonder if she would be a MitraClip candidate.   [name], MD   Pt aware of results and of the above order.  She is agreeable to an appt with the Structural Clinic to discuss mitraclip procedure.

## 2021-02-19 NOTE — Telephone Encounter (Signed)
Follow up:    Patient returning for results 

## 2021-02-19 NOTE — Telephone Encounter (Signed)
Attempted to contact pt at # left in message.  NA-left message to call back to review results.

## 2021-03-02 ENCOUNTER — Ambulatory Visit: Payer: Medicare Other | Admitting: Cardiovascular Disease

## 2021-03-02 ENCOUNTER — Other Ambulatory Visit: Payer: Self-pay

## 2021-03-02 ENCOUNTER — Encounter: Payer: Self-pay | Admitting: Cardiovascular Disease

## 2021-03-02 VITALS — BP 150/90 | HR 89 | Ht 67.0 in | Wt 157.0 lb

## 2021-03-02 DIAGNOSIS — I340 Nonrheumatic mitral (valve) insufficiency: Secondary | ICD-10-CM

## 2021-03-02 DIAGNOSIS — I350 Nonrheumatic aortic (valve) stenosis: Secondary | ICD-10-CM | POA: Diagnosis not present

## 2021-03-02 NOTE — Progress Notes (Signed)
[facility]   MULTIDISCIPLINARY HEART VALVE TEAM  Date:  03/02/2021   ID:  Judy Walker, DOB [birth year], MRN [id number]  PCP:  Ria Bush, MD   Chief Complaint  Patient presents with   Shortness of Breath     HISTORY OF PRESENT ILLNESS: Judy Walker is a 87 y.o. female who presents for evaluation of mitral regurgitation, referred by Dr Marlou Porch.  The patient is here with her son and daughter today. She has been followed for persistent atrial fibrillation, mild aortic stenosis, and mild mitral regurgitation for several years. She recently was found to have moderately severe mitral regurgitation and is referred for evaluation of further diagnostic and treatment options.   She complains of fatigue and mild shortness of breath with certain activities such as vacuuming her carpet or walking up hill. However, she has been able to remain functionally independent and she keeps up with her housework without much difficulty. She went to a [employer] a few weeks ago and didn't have any trouble climbing the stadium stairs. The patient's symptoms of fatigue and mild DOE started after she had a GI bleed last year. Prior to that episode she had no symptoms.   The patient has had regular dental care and reports no problems with her teeth.  Past Medical History:  Diagnosis Date   Anemia    Atrial fibrillation ([place])    Benign positional vertigo 2005   Colonoscopy refused    " I elected not to have one" [facility] reviewed 07/06/10; she continues to decline colonscopy   Heart murmur    HLD (hyperlipidemia)    total cholesterol 340 in 2003, NHDL 61, TG 75. NMR Lipoprofile 2005: LDL 84 (933/285), LDL glad =<120 ideally <90. Framingham study LDL goal <130   HTN (hypertension)    Hypothyroidism    Osteopenia 08/2012, 09/2015   DEXA -1.9 (2014) -2.3 (2017)    Current Outpatient Medications  Medication Sig Dispense Refill   Ascorbic Acid (VITAMIN C GUMMIE) 120 MG CHEW Chew  250 mg by mouth daily.     atorvastatin (LIPITOR) 20 MG tablet Take 1 tablet (20 mg total) by mouth daily. 90 tablet 3   Calcium Carbonate (CALCIUM 500 PO) Take 500 mg by mouth daily.     Cholecalciferol (VITAMIN D3) 25 MCG (1000 UT) CAPS Take 1 capsule (1,000 Units total) by mouth daily. 30 capsule    diltiazem (CARDIZEM CD) 240 MG 24 hr capsule Take 1 capsule (240 mg total) by mouth daily. 90 capsule 3   diphenhydrAMINE (BENADRYL) 25 mg capsule Take 25 mg by mouth at bedtime.     ELIQUIS 5 MG TABS tablet TAKE 1 TABLET BY MOUTH TWICE A DAY 60 tablet 5   hydrochlorothiazide (HYDRODIURIL) 25 MG tablet Take 1 tablet (25 mg total) by mouth daily. 90 tablet 3   levothyroxine (SYNTHROID) 75 MCG tablet Take 1 tablet (75 mcg total) by mouth daily. 90 tablet 3   losartan (COZAAR) 100 MG tablet Take 1 tablet (100 mg total) by mouth daily. 90 tablet 1   Multiple Vitamins-Minerals (MULTIVITAMIN ADULT) TABS Take 1 tablet by mouth daily.     pantoprazole (PROTONIX) 40 MG tablet TAKE 1 TABLET BY MOUTH EVERY DAY 90 tablet 1   potassium gluconate 595 (99 K) MG TABS tablet Take 595 mg by mouth daily.     No current facility-administered medications for this visit.    ALLERGIES:   Patient has no known allergies.   SOCIAL HISTORY:  The patient  reports that she has never smoked. She has never used smokeless tobacco. She reports current alcohol use. She reports that she does not use drugs.   FAMILY HISTORY:  The patient's family history includes Alzheimer's disease in her sister; Brain cancer in her brother; Breast cancer in her sister and sister; Cancer in her maternal grandmother; Cirrhosis in her sister; Diabetes in her father, paternal grandfather, and another family member; Heart attack in her brother, brother, and father; Heart disease in her brother; Kidney cancer in her mother; Other in her brother and brother; Ovarian cancer in her sister; Stroke in her sister; Ulcerative colitis in her son.   REVIEW OF  SYSTEMS:  Positive for fatigue.   All other systems are reviewed and negative.   PHYSICAL EXAM: VS:  BP (!) 150/90   Pulse 89   Ht 5\' 7"  (1.702 m)   Wt 157 lb (71.2 kg)   SpO2 98%   BMI 24.59 kg/m  , BMI Body mass index is 24.59 kg/m. GEN: Well nourished, well developed, in no acute distress HEENT: normal Neck: No JVD. carotids 2+ without bruits or masses Cardiac: The heart is irregularly irregular with a 2/6 systolic murmur at the RUSB and the LLSB. No edema. Pedal pulses 2+ = bilaterally  Respiratory:  clear to auscultation bilaterally GI: soft, nontender, nondistended, + BS MS: no deformity or atrophy Skin: warm and dry, no rash Neuro:  Strength and sensation are intact Psych: euthymic mood, full affect  EKG:  EKG from 01/15/21 reviewed and demonstrates atrial fibrillation 83 bpm, otherwise normal  RECENT LABS: 05/15/2020: ALT 14; BUN 24; Creatinine, Ser 1.04; Hemoglobin 13.7; Platelets 236.0; Potassium 4.1; Sodium 140; TSH 2.47  05/15/2020: Cholesterol 200; HDL 62.40; LDL Cholesterol 111; Total CHOL/HDL Ratio 3; Triglycerides 135.0; VLDL 27.0   CrCl cannot be calculated (Patient's most recent lab result is older than the maximum 21 days allowed.).   Wt Readings from Last 3 Encounters:  03/02/21 157 lb (71.2 kg)  01/15/21 157 lb (71.2 kg)  06/17/20 156 lb (70.8 kg)     CARDIAC STUDIES: Echo: IMPRESSIONS     1. Left ventricular ejection fraction, by estimation, is 60 to 65%. The  left ventricle has normal function. The left ventricle has no regional  wall motion abnormalities. There is mild concentric left ventricular  hypertrophy. Left ventricular diastolic  function could not be evaluated.   2. Right ventricular systolic function is normal. The right ventricular  size is normal. There is normal pulmonary artery systolic pressure. The  estimated right ventricular systolic pressure is 46.6 mmHg.   3. Left atrial size was mild to moderately dilated.   4. Right atrial  size was mild to moderately dilated.   5. The mitral valve is myxomatous. Moderate to severe mitral valve  regurgitation. There is mild late systolic prolapse of both leaflets of  the mitral valve.   6. The tricuspid valve is myxomatous. Tricuspid valve regurgitation is  mild to moderate.   7. The right coronary cusp is severely calcified and virtually immobile..  The aortic valve is tricuspid. There is severe calcifcation of the aortic  valve. There is severe thickening of the aortic valve. Aortic valve  regurgitation is not visualized. Mild   aortic valve stenosis.   8. The inferior vena cava is normal in size with greater than 50%  respiratory variability, suggesting right atrial pressure of 3 mmHg.   Comparison(s): Prior images reviewed side by side. The left ventricular  function  is unchanged. There is marked worsening of the mitral  insufficiency. There is slight worsening of the aortic stenosis.   LEFT VENTRICLE  PLAX 2D  LVIDd:         3.40 cm  Diastology  LVIDs:         2.00 cm  LV e' medial:    7.98 cm/s  LV PW:         1.20 cm  LV E/e' medial:  17.6  LV IVS:        1.40 cm  LV e' lateral:   9.68 cm/s  LVOT diam:     2.00 cm  LV E/e' lateral: 14.5  LV SV:         46  LV SV Index:   25  LVOT Area:     3.14 cm      RIGHT VENTRICLE            IVC  RVSP:           29.4 mmHg  IVC diam: 0.90 cm   LEFT ATRIUM             Index       RIGHT ATRIUM           Index  LA diam:        3.60 cm 1.98 cm/m  RA Pressure: 3.00 mmHg  LA Vol (A2C):   47.4 ml 26.12 ml/m RA Area:     21.20 cm  LA Vol (A4C):   58.3 ml 32.13 ml/m RA Volume:   70.90 ml  39.08 ml/m  LA Biplane Vol: 55.5 ml 30.59 ml/m   AORTIC VALVE  AV Area (Vmax):    1.06 cm  AV Area (Vmean):   1.07 cm  AV Area (VTI):     1.07 cm  AV Vmax:           235.80 cm/s  AV Vmean:          163.800 cm/s  AV VTI:            0.429 m  AV Peak Grad:      22.2 mmHg  AV Mean Grad:      12.4 mmHg  LVOT Vmax:         79.64 cm/s   LVOT Vmean:        56.020 cm/s  LVOT VTI:          0.146 m  LVOT/AV VTI ratio: 0.34     AORTA  Ao Root diam: 3.20 cm  Ao Asc diam:  2.90 cm   MITRAL VALVE                TRICUSPID VALVE  MV Area (PHT): 2.96 cm     TR Peak grad:   26.4 mmHg  MV Decel Time: 256 msec     TR Vmax:        257.00 cm/s  MV E velocity: 140.00 cm/s  Estimated RAP:  3.00 mmHg                              RVSP:           29.4 mmHg                                 SHUNTS  Systemic VTI:  0.15 m                              Systemic Diam: 2.00 cm   ASSESSMENT AND PLAN: 87 year old woman with persistent atrial fibrillation, presenting with New York Heart Association functional class II symptoms of exertional dyspnea and fatigue/chronic diastolic heart failure, found to have mild to moderate aortic stenosis, moderately severe mitral regurgitation, and moderate tricuspid regurgitation.  I have reviewed the natural history of mitral regurgitation with the patient and their family members who are present today. We have discussed the limitations of medical therapy and the poor prognosis associated with symptomatic mitral regurgitation. We have also reviewed potential treatment options, including medical therapy, conventional surgical mitral valve repair or replacement, and percutaneous mitral valve therapies such as edge-to-edge mitral valve approximation with MitraClip. We discussed treatment options in the context of this patient's specific comorbid medical conditions.  The patient's 2D echo images are personally reviewed.  LV and RV systolic function is preserved with normal LVEF.  There is no evidence of pulmonary hypertension.  The patient has a myxomatous mitral valve with mild bileaflet prolapse and moderate to severe central mitral regurgitation.  She has a calcified aortic valve with a fixed right coronary cusp but only mild aortic stenosis by Doppler assessment.  Her mean transvalvular gradient  is less than 20 mmHg.  She has very mild limitation of functional capacity.  She does not have an obvious flail segment or evidence of ruptured chordae tendon a based on her 2D echo imaging.  We could proceed with TEE to better define the functional anatomy of her mitral valve.  However, considering her multivalve disease and good functional capacity at her advanced age, I am inclined to continue with medical therapy at this time.  I would like to see her back in 1 year with a repeat echocardiogram.  She is counseled about the natural history of both aortic stenosis and mitral regurgitation today.  She understands to contact me or Dr. Marlou Porch if she develops any progressive symptoms of shortness of breath, orthopnea, leg swelling, or fatigue.  Otherwise she will continue with her current treatment.  The patient and her family are agreeable with this approach and all of their questions are answered today.  Deatra James 03/02/2021 12:59 PM     [address] [place] [id number]  [phone] (office) [phone] (fax)

## 2021-03-02 NOTE — Patient Instructions (Signed)
Medication Instructions:   Your physician recommends that you continue on your current medications as directed. Please refer to the Current Medication list given to you today.  *If you need a refill on your cardiac medications before your next appointment, please call your pharmacy*   Testing/Procedures:  Your physician has requested that you have an echocardiogram. Echocardiography is a painless test that uses sound waves to create images of your heart. It provides your doctor with information about the size and shape of your heart and how well your heart's chambers and valves are working. This procedure takes approximately one hour. There are no restrictions for this procedure.  IN ONE YEAR, SAME DAY AS YOUR FOLLOW-UP APPOINTMENT WITH [facility]IC--YOU WILL GET A CALL FROM OUR STRUCTURAL RN NAVIGATOR TO ARRANGE YOUR ECHO AND FOLLOW-UP APPOINTMENT SAME DAY, FOR ONE YEAR OUT.     Follow-Up: At [facility], you and your health needs are our priority.  As part of our continuing mission to provide you with exceptional heart care, we have created designated Provider Care Teams.  These Care Teams include your primary Cardiologist (physician) and Advanced Practice Providers (APPs -  Physician Assistants and Nurse Practitioners) who all work together to provide you with the care you need, when you need it.  We recommend signing up for the patient portal called "MyChart".  Sign up information is provided on this After Visit Summary.  MyChart is used to connect with patients for Virtual Visits (Telemedicine).  Patients are able to view lab/test results, encounter notes, upcoming appointments, etc.  Non-urgent messages can be sent to your provider as well.   To learn more about what you can do with MyChart, go to [email or web address].    Your next appointment:   1 year(s)  The format for your next appointment:   In Person  Provider:   Sherren Mocha, MD IN [facility] --PLEASE  SCHEDULE ECHO SAME DAY PER DR. Burt Knack

## 2021-04-23 ENCOUNTER — Other Ambulatory Visit: Payer: Self-pay | Admitting: Family Medicine

## 2021-05-07 ENCOUNTER — Other Ambulatory Visit: Payer: Self-pay | Admitting: Cardiology

## 2021-05-07 NOTE — Telephone Encounter (Signed)
Prescription refill request for Eliquis received. Indication: Afib  Last office visit: 01/15/21 Marlou Porch)  Scr: 1.04 (05/15/20)  Age: 87  Weight: 71.2kg  Appropriate dose and refill sent to requested pharmacy.

## 2021-05-08 ENCOUNTER — Other Ambulatory Visit: Payer: Self-pay | Admitting: Physician Assistant

## 2021-05-11 NOTE — Progress Notes (Signed)
Subjective:   Judy Walker is a 87 y.o. female who presents for Medicare Annual (Subsequent) preventive examination.  I connected with Takeira Yanes today by telephone and verified that I am speaking with the correct person using two identifiers. Location patient: home Location provider: work Persons participating in the virtual visit: patient, Marine scientist.    I discussed the limitations, risks, security and privacy concerns of performing an evaluation and management service by telephone and the availability of in person appointments. I also discussed with the patient that there may be a patient responsible charge related to this service. The patient expressed understanding and verbally consented to this telephonic visit.    Interactive audio and video telecommunications were attempted between this provider and patient, however failed, due to patient having technical difficulties OR patient did not have access to video capability.  We continued and completed visit with audio only.  Some vital signs may be absent or patient reported.   Time Spent with patient on telephone encounter: 20 minutes  Review of Systems     Cardiac Risk Factors include: advanced age (>5men, >64 women);dyslipidemia;hypertension     Objective:    Today's Vitals   05/14/21 1027  Weight: 157 lb (71.2 kg)  Height: 5\' 7"  (1.702 m)   Body mass index is 24.59 kg/m.  Advanced Directives 05/14/2021 05/12/2020 06/27/2019 03/22/2019 09/26/2017 09/22/2016  Does Patient Have a Medical Advance Directive? Yes Yes Yes Yes Yes Yes  Type of Paramedic of [place];Living will [place];Living will Living will;Healthcare Power of [place];Living will [place];Living will [place];Living will  Does patient want to make changes to medical advance directive? Yes (MAU/Ambulatory/Procedural Areas - Information given) - - -  - -  Copy of [place] in Chart? Yes - validated most recent copy scanned in chart (See row information) Yes - validated most recent copy scanned in chart (See row information) No - copy requested Yes - validated most recent copy scanned in chart (See row information) Yes No - copy requested    Current Medications (verified) Outpatient Encounter Medications as of 05/14/2021  Medication Sig   ELIQUIS 5 MG TABS tablet TAKE 1 TABLET BY MOUTH TWICE A DAY   Ascorbic Acid (VITAMIN C GUMMIE) 120 MG CHEW Chew 250 mg by mouth daily.   atorvastatin (LIPITOR) 20 MG tablet TAKE 1 TABLET BY MOUTH EVERY DAY   Calcium Carbonate (CALCIUM 500 PO) Take 500 mg by mouth daily.   Cholecalciferol (VITAMIN D3) 25 MCG (1000 UT) CAPS Take 1 capsule (1,000 Units total) by mouth daily.   diltiazem (CARDIZEM CD) 240 MG 24 hr capsule Take 1 capsule (240 mg total) by mouth daily.   diphenhydrAMINE (BENADRYL) 25 mg capsule Take 25 mg by mouth at bedtime.   hydrochlorothiazide (HYDRODIURIL) 25 MG tablet Take 1 tablet (25 mg total) by mouth daily.   levothyroxine (SYNTHROID) 75 MCG tablet Take 1 tablet (75 mcg total) by mouth daily.   losartan (COZAAR) 100 MG tablet Take 1 tablet (100 mg total) by mouth daily.   Multiple Vitamins-Minerals (MULTIVITAMIN ADULT) TABS Take 1 tablet by mouth daily.   pantoprazole (PROTONIX) 40 MG tablet Take 1 tablet (40 mg total) by mouth daily. OFFICE VISIT DUE FOR REFILLS   potassium gluconate 595 (99 K) MG TABS tablet Take 595 mg by mouth daily.   No facility-administered encounter medications on file as of 05/14/2021.    Allergies (verified)  Patient has no known allergies.   History: Past Medical History:  Diagnosis Date   Anemia    Atrial fibrillation ([place])    Benign positional vertigo 2005   Colonoscopy refused    " I elected not to have one" [facility] reviewed 07/06/10; she continues to decline colonscopy   Heart murmur    HLD (hyperlipidemia)    total cholesterol  340 in 2003, NHDL 61, TG 75. NMR Lipoprofile 2005: LDL 84 (933/285), LDL glad =<120 ideally <90. Framingham study LDL goal <130   HTN (hypertension)    Hypothyroidism    Osteopenia 08/2012, 09/2015   DEXA -1.9 (2014) -2.3 (2017)   Past Surgical History:  Procedure Laterality Date   ABDOMINAL HYSTERECTOMY  2003   & BSO for cystic ovaries . No PMH of abnormal PAP   CARDIAC CATHETERIZATION     normal   CARDIOVERSION N/A 06/27/2019   Procedure: CARDIOVERSION;  Surgeon: Jerline Pain, MD;  Location: [facility] ENDOSCOPY;  Service: Cardiovascular;  Laterality: N/A;   CATARACT EXTRACTION, BILATERAL  09/2011   Dr.[name]    CYSTOSCOPY     bladder @ age 66 for painful hematuria   ESOPHAGOGASTRODUODENOSCOPY  12/2019   prepyloric scarring, 4cm HH, biopsies with healing ulcer, neg H pylori Carlean Purl)   Family History  Problem Relation Age of Onset   Diabetes Father    Heart attack Father        in 31s   Kidney cancer Mother    Breast cancer Sister    Stroke Sister    Cirrhosis Sister        veins came appart and had to be glued back together   Breast cancer Sister        mets   Diabetes Other         4 brothers and  3 sisters   Ovarian cancer Sister    Heart attack Brother        1 pre 17   Alzheimer's disease Sister    Brain cancer Brother    Cancer Maternal Grandmother        unknown type   Diabetes Paternal Grandfather    Heart attack Brother    Other Brother        died at birth   Other Brother        died at birth   Heart disease Brother    Ulcerative colitis Son    Social History   Socioeconomic History   Marital status: Widowed    Spouse name: Not on file   Number of children: 2   Years of education: Not on file   Highest education level: Not on file  Occupational History   Occupation: retired  Tobacco Use   Smoking status: Never   Smokeless tobacco: Never  Vaping Use   Vaping Use: Never used  Substance and Sexual Activity   Alcohol use: Yes    Comment:  RARELY    Drug use: No   Sexual activity: Not on file  Other Topics Concern   Not on file  Social History Narrative   Lives alone. Widow 2012   Daughter lives nearby.   Occupation: retired Clinical cytogeneticist   Was Programmer, multimedia growing up   Activity: Regular exercise - walking 1-2 miles daily, stays active in yard.   Diet: daily fruits/vegetables, some water   Social Determinants of Health   Financial Resource Strain: Low Risk    Difficulty of Paying Living Expenses: Not hard at all  Food Insecurity: No Food Insecurity   Worried About Charity fundraiser in the Last Year: Never true   Ran Out of Food in the Last Year: Never true  Transportation Needs: No Transportation Needs   Lack of Transportation (Medical): No   Lack of Transportation (Non-Medical): No  Physical Activity: Insufficiently Active   Days of Exercise per Week: 3 days   Minutes of Exercise per Session: 10 min  Stress: No Stress Concern Present   Feeling of Stress : Not at all  Social Connections: Moderately Isolated   Frequency of Communication with Friends and Family: More than three times a week   Frequency of Social Gatherings with Friends and Family: Twice a week   Attends Religious Services: More than 4 times per year   Active Member of [employer] or Organizations: No   Attends Archivist Meetings: Never   Marital Status: Widowed    Tobacco Counseling Counseling given: Not Answered   Clinical Intake:  Pre-visit preparation completed: Yes  Pain : No/denies pain     BMI - recorded: 24.58 Nutritional Status: BMI of 19-24  Normal Nutritional Risks: None Diabetes: No  How often do you need to have someone help you when you read instructions, pamphlets, or other written materials from your doctor or pharmacy?: 1 - Never  Diabetic? No  Interpreter Needed?: No  Information entered by :: Orrin Brigham LPN   Activities of Daily Living In your present state of health, do you have any difficulty  performing the following activities: 05/14/2021  Hearing? N  Vision? N  Difficulty concentrating or making decisions? N  Walking or climbing stairs? N  Dressing or bathing? N  Doing errands, shopping? Y  Comment children drive to appointments and shopping  Preparing Food and eating ? N  Using the Toilet? N  In the past six months, have you accidently leaked urine? N  Do you have problems with loss of bowel control? N  Managing your Medications? N  Managing your Finances? N  Housekeeping or managing your Housekeeping? N  Some recent data might be hidden    Patient Care Team: Ria Bush, MD as PCP - General (Family Medicine) Jerline Pain, MD as PCP - Cardiology (Cardiology)  Indicate any recent Medical Services you may have received from other than [facility] providers in the past year (date may be approximate).     Assessment:   This is a routine wellness examination for Rissie.  Hearing/Vision screen Hearing Screening - Comments:: No issues Vision Screening - Comments:: Last exam several years ago, plans to make an appointment next year  Dietary issues and exercise activities discussed: Current Exercise Habits: Home exercise routine, Type of exercise: walking;treadmill, Time (Minutes): 10, Frequency (Times/Week): 3, Weekly Exercise (Minutes/Week): 30, Intensity: Mild   Goals Addressed             This Visit's Progress    Patient Stated       Would like to maintain current routine       Depression Screen PHQ 2/9 Scores 05/14/2021 05/12/2020 03/22/2019 09/26/2017 09/22/2016 09/10/2015 09/04/2014  PHQ - 2 Score 0 0 0 0 0 0 0  PHQ- 9 Score - 0 0 0 - - -    Fall Risk Fall Risk  05/14/2021 05/12/2020 11/18/2019 03/22/2019 09/26/2017  Falls in the past year? 0 0 1 0 No  Number falls in past yr: 0 0 0 - -  Injury with Fall? 0 0 1 - -  Risk for  fall due to : No Fall Risks Medication side effect History of fall(s) Medication side effect -  Follow up Falls prevention discussed  Falls evaluation completed;Falls prevention discussed - Falls evaluation completed;Falls prevention discussed -    FALL RISK PREVENTION PERTAINING TO THE HOME:  Any stairs in or around the home? Yes  If so, are there any without handrails? No  Home free of loose throw rugs in walkways, pet beds, electrical cords, etc? No  Adequate lighting in your home to reduce risk of falls? Yes   ASSISTIVE DEVICES UTILIZED TO PREVENT FALLS:  Life alert? No  Use of a cane, walker or w/c? No  Grab bars in the bathroom? No  Shower chair or bench in shower? No  Elevated toilet seat or a handicapped toilet? Yes   TIMED UP AND GO:  Was the test performed? No , Visit completed over the phone   Cognitive Function: Normal cognitive status assessed by this Nurse Health Advisor. No abnormalities found.   MMSE - Mini Mental State Exam 05/12/2020 03/22/2019 09/26/2017 09/22/2016  Orientation to time 5 5 5 5   Orientation to Place 5 5 5 5   Registration 3 3 3 3   Attention/ Calculation 5 5 0 0  Recall 3 3 2 3   Recall-comments - - unable to recall 1 of 3 words -  Language- name 2 objects - - 0 0  Language- repeat 1 1 1 1   Language- follow 3 step command - - 3 3  Language- read & follow direction - - 0 0  Write a sentence - - 0 0  Copy design - - 0 0  Total score - - 19 20        Immunizations Immunization History  Administered Date(s) Administered   Fluad Quad(high Dose 65+) 03/26/2019, 04/12/2020, 02/25/2021   Influenza Nasal 03/07/2015   Influenza Whole 04/06/2007, 03/04/2008, 06/25/2009   Influenza, High Dose Seasonal PF 04/01/2018   Influenza-Unspecified 03/06/2012, 03/06/2013, 03/06/2014, 03/06/2016   PFIZER(Purple Top)SARS-COV-2 Vaccination 07/11/2019, 08/05/2019, 03/15/2020   Pfizer Covid-19 Vaccine Bivalent Booster 50yrs & up 02/12/2021   Pneumococcal Conjugate-13 08/08/2013   Pneumococcal Polysaccharide-23 05/11/2005   Td 10/28/1999, 07/06/2010   Zoster, Live 09/04/2013    TDAP  status: Due, Education has been provided regarding the importance of this vaccine. Advised may receive this vaccine at local pharmacy or Health Dept. Aware to provide a copy of the vaccination record if obtained from local pharmacy or Health Dept. Verbalized acceptance and understanding.  Flu Vaccine status: Up to date  Pneumococcal vaccine status: Up to date  Covid-19 vaccine status: Completed vaccines  Qualifies for Shingles Vaccine? Yes   Zostavax completed Yes   Shingrix Completed?: No.    Education has been provided regarding the importance of this vaccine. Patient has been advised to call insurance company to determine out of pocket expense if they have not yet received this vaccine. Advised may also receive vaccine at local pharmacy or Health Dept. Verbalized acceptance and understanding.  Screening Tests Health Maintenance  Topic Date Due   Zoster Vaccines- Shingrix (1 of 2) Never done   TETANUS/TDAP  07/06/2020   MAMMOGRAM  03/27/2021   Pneumonia Vaccine 53+ Years old  Completed   INFLUENZA VACCINE  Completed   DEXA SCAN  Completed   COVID-19 Vaccine  Completed   HPV VACCINES  Aged Out    Health Maintenance  Health Maintenance Due  Topic Date Due   Zoster Vaccines- Shingrix (1 of 2) Never done   TETANUS/TDAP  07/06/2020   MAMMOGRAM  03/27/2021    Colorectal cancer screening: No longer required.   Mammogram status: Ordered 05/14/21. Pt provided with contact info and advised to call to schedule appt.   Bone Density Status: No longer required  Lung Cancer Screening: (Low Dose CT Chest recommended if Age 38-80 years, 30 pack-year currently smoking OR have quit w/in 15years.) does not qualify.     Additional Screening:  Hepatitis C Screening: does not qualify   Vision Screening: Recommended annual ophthalmology exams for early detection of glaucoma and other disorders of the eye. Is the patient up to date with their annual eye exam?  No , patient plans to make an  appointment. Who is the provider or what is the name of the office in which the patient attends annual eye exams? Provider information unavailable   Dental Screening: Recommended annual dental exams for proper oral hygiene  Community Resource Referral / Chronic Care Management: CRR required this visit?  No   CCM required this visit?  No      Plan:     I have personally reviewed and noted the following in the patient's chart:   Medical and social history Use of alcohol, tobacco or illicit drugs  Current medications and supplements including opioid prescriptions.  Functional ability and status Nutritional status Physical activity Advanced directives List of other physicians Hospitalizations, surgeries, and ER visits in previous 12 months Vitals Screenings to include cognitive, depression, and falls Referrals and appointments  In addition, I have reviewed and discussed with patient certain preventive protocols, quality metrics, and best practice recommendations. A written personalized care plan for preventive services as well as general preventive health recommendations were provided to patient.   Due to this being a telephonic visit, the after visit summary with patients personalized plan was offered to patient via mail or my-chart.  Patient preferred to pick up at office at next visit.   Loma Messing, LPN  [id number]   Nurse Health Advisor  Nurse Notes: none

## 2021-05-14 ENCOUNTER — Ambulatory Visit (INDEPENDENT_AMBULATORY_CARE_PROVIDER_SITE_OTHER): Payer: Medicare Other

## 2021-05-14 VITALS — Ht 67.0 in | Wt 157.0 lb

## 2021-05-14 DIAGNOSIS — Z0000 Encounter for general adult medical examination without abnormal findings: Secondary | ICD-10-CM | POA: Diagnosis not present

## 2021-05-14 DIAGNOSIS — Z1231 Encounter for screening mammogram for malignant neoplasm of breast: Secondary | ICD-10-CM | POA: Diagnosis not present

## 2021-05-14 NOTE — Patient Instructions (Signed)
Judy Walker , Thank you for taking time to complete your Medicare Wellness Visit. I appreciate your ongoing commitment to your health goals. Please review the following plan we discussed and let me know if I can assist you in the future.   Screening recommendations/referrals: Colonoscopy: no longer required Mammogram: due, last completed 03/26/19, ordered today someone will call to schedule an appointment Bone Density: no longer required Recommended yearly ophthalmology/optometry visit for glaucoma screening and checkup Recommended yearly dental visit for hygiene and checkup  Vaccinations: Influenza vaccine: up to date Pneumococcal vaccine: up to date Tdap vaccine: due, last completed 06/3110, Discuss with your local pharmacy Shingles vaccine: Discuss with your local pharmacy Covid-19: up to date  Advanced directives: copy on file  Conditions/risks identified: see problem list  Next appointment: Follow up in one year for your annual wellness visit 05/16/22 @  6:00am, this will be a telephone visit   Preventive Care 65 Years and Older, Female Preventive care refers to lifestyle choices and visits with your health care provider that can promote health and wellness. What does preventive care include? A yearly physical exam. This is also called an annual well check. Dental exams once or twice a year. Routine eye exams. Ask your health care provider how often you should have your eyes checked. Personal lifestyle choices, including: Daily care of your teeth and gums. Regular physical activity. Eating a healthy diet. Avoiding tobacco and drug use. Limiting alcohol use. Practicing safe sex. Taking low-dose aspirin every day. Taking vitamin and mineral supplements as recommended by your health care provider. What happens during an annual well check? The services and screenings done by your health care provider during your annual well check will depend on your age, overall health,  lifestyle risk factors, and family history of disease. Counseling  Your health care provider may ask you questions about your: Alcohol use. Tobacco use. Drug use. Emotional well-being. Home and relationship well-being. Sexual activity. Eating habits. History of falls. Memory and ability to understand (cognition). Work and work Statistician. Reproductive health. Screening  You may have the following tests or measurements: Height, weight, and BMI. Blood pressure. Lipid and cholesterol levels. These may be checked every 5 years, or more frequently if you are over 65 years old. Skin check. Lung cancer screening. You may have this screening every year starting at age 44 if you have a 30-pack-year history of smoking and currently smoke or have quit within the past 15 years. Fecal occult blood test (FOBT) of the stool. You may have this test every year starting at age 48. Flexible sigmoidoscopy or colonoscopy. You may have a sigmoidoscopy every 5 years or a colonoscopy every 10 years starting at age 28. Hepatitis C blood test. Hepatitis B blood test. Sexually transmitted disease (STD) testing. Diabetes screening. This is done by checking your blood sugar (glucose) after you have not eaten for a while (fasting). You may have this done every 1-3 years. Bone density scan. This is done to screen for osteoporosis. You may have this done starting at age 11. Mammogram. This may be done every 1-2 years. Talk to your health care provider about how often you should have regular mammograms. Talk with your health care provider about your test results, treatment options, and if necessary, the need for more tests. Vaccines  Your health care provider may recommend certain vaccines, such as: Influenza vaccine. This is recommended every year. Tetanus, diphtheria, and acellular pertussis (Tdap, Td) vaccine. You may need a Td booster every 10  years. Zoster vaccine. You may need this after age  15. Pneumococcal 13-valent conjugate (PCV13) vaccine. One dose is recommended after age 63. Pneumococcal polysaccharide (PPSV23) vaccine. One dose is recommended after age 49. Talk to your health care provider about which screenings and vaccines you need and how often you need them. This information is not intended to replace advice given to you by your health care provider. Make sure you discuss any questions you have with your health care provider. Document Released: 06/19/2015 Document Revised: 02/10/2016 Document Reviewed: 03/24/2015 Elsevier Interactive Patient Education  2017 [place] Prevention in the Home Falls can cause injuries. They can happen to people of all ages. There are many things you can do to make your home safe and to help prevent falls. What can I do on the outside of my home? Regularly fix the edges of walkways and driveways and fix any cracks. Remove anything that might make you trip as you walk through a door, such as a raised step or threshold. Trim any bushes or trees on the path to your home. Use bright outdoor lighting. Clear any walking paths of anything that might make someone trip, such as rocks or tools. Regularly check to see if handrails are loose or broken. Make sure that both sides of any steps have handrails. Any raised decks and porches should have guardrails on the edges. Have any leaves, snow, or ice cleared regularly. Use sand or salt on walking paths during winter. Clean up any spills in your garage right away. This includes oil or grease spills. What can I do in the bathroom? Use night lights. Install grab bars by the toilet and in the tub and shower. Do not use towel bars as grab bars. Use non-skid mats or decals in the tub or shower. If you need to sit down in the shower, use a plastic, non-slip stool. Keep the floor dry. Clean up any water that spills on the floor as soon as it happens. Remove soap buildup in the tub or shower  regularly. Attach bath mats securely with double-sided non-slip rug tape. Do not have throw rugs and other things on the floor that can make you trip. What can I do in the bedroom? Use night lights. Make sure that you have a light by your bed that is easy to reach. Do not use any sheets or blankets that are too big for your bed. They should not hang down onto the floor. Have a firm chair that has side arms. You can use this for support while you get dressed. Do not have throw rugs and other things on the floor that can make you trip. What can I do in the kitchen? Clean up any spills right away. Avoid walking on wet floors. Keep items that you use a lot in easy-to-reach places. If you need to reach something above you, use a strong step stool that has a grab bar. Keep electrical cords out of the way. Do not use floor polish or wax that makes floors slippery. If you must use wax, use non-skid floor wax. Do not have throw rugs and other things on the floor that can make you trip. What can I do with my stairs? Do not leave any items on the stairs. Make sure that there are handrails on both sides of the stairs and use them. Fix handrails that are broken or loose. Make sure that handrails are as long as the stairways. Check any carpeting to make sure  that it is firmly attached to the stairs. Fix any carpet that is loose or worn. Avoid having throw rugs at the top or bottom of the stairs. If you do have throw rugs, attach them to the floor with carpet tape. Make sure that you have a light switch at the top of the stairs and the bottom of the stairs. If you do not have them, ask someone to add them for you. What else can I do to help prevent falls? Wear shoes that: Do not have high heels. Have rubber bottoms. Are comfortable and fit you well. Are closed at the toe. Do not wear sandals. If you use a stepladder: Make sure that it is fully opened. Do not climb a closed stepladder. Make sure that  both sides of the stepladder are locked into place. Ask someone to hold it for you, if possible. Clearly mark and make sure that you can see: Any grab bars or handrails. First and last steps. Where the edge of each step is. Use tools that help you move around (mobility aids) if they are needed. These include: Canes. Walkers. Scooters. Crutches. Turn on the lights when you go into a dark area. Replace any light bulbs as soon as they burn out. Set up your furniture so you have a clear path. Avoid moving your furniture around. If any of your floors are uneven, fix them. If there are any pets around you, be aware of where they are. Review your medicines with your doctor. Some medicines can make you feel dizzy. This can increase your chance of falling. Ask your doctor what other things that you can do to help prevent falls. This information is not intended to replace advice given to you by your health care provider. Make sure you discuss any questions you have with your health care provider. Document Released: 03/19/2009 Document Revised: 10/29/2015 Document Reviewed: 06/27/2014 Elsevier Interactive Patient Education  2017 [employer].

## 2021-05-18 ENCOUNTER — Encounter: Payer: Self-pay | Admitting: Family Medicine

## 2021-05-18 ENCOUNTER — Other Ambulatory Visit: Payer: Self-pay

## 2021-05-18 ENCOUNTER — Ambulatory Visit (INDEPENDENT_AMBULATORY_CARE_PROVIDER_SITE_OTHER): Payer: Medicare Other | Admitting: Family Medicine

## 2021-05-18 VITALS — BP 134/82 | HR 86 | Temp 97.7°F | Ht 66.5 in | Wt 158.1 lb

## 2021-05-18 DIAGNOSIS — I4821 Permanent atrial fibrillation: Secondary | ICD-10-CM | POA: Diagnosis not present

## 2021-05-18 DIAGNOSIS — E7800 Pure hypercholesterolemia, unspecified: Secondary | ICD-10-CM | POA: Diagnosis not present

## 2021-05-18 DIAGNOSIS — I10 Essential (primary) hypertension: Secondary | ICD-10-CM

## 2021-05-18 DIAGNOSIS — Z0000 Encounter for general adult medical examination without abnormal findings: Secondary | ICD-10-CM | POA: Diagnosis not present

## 2021-05-18 DIAGNOSIS — M8580 Other specified disorders of bone density and structure, unspecified site: Secondary | ICD-10-CM | POA: Diagnosis not present

## 2021-05-18 DIAGNOSIS — E039 Hypothyroidism, unspecified: Secondary | ICD-10-CM | POA: Diagnosis not present

## 2021-05-18 DIAGNOSIS — E559 Vitamin D deficiency, unspecified: Secondary | ICD-10-CM | POA: Diagnosis not present

## 2021-05-18 DIAGNOSIS — Z8673 Personal history of transient ischemic attack (TIA), and cerebral infarction without residual deficits: Secondary | ICD-10-CM

## 2021-05-18 DIAGNOSIS — I38 Endocarditis, valve unspecified: Secondary | ICD-10-CM | POA: Diagnosis not present

## 2021-05-18 LAB — COMPREHENSIVE METABOLIC PANEL
ALT: 14 U/L (ref 0–35)
AST: 18 U/L (ref 0–37)
Albumin: 4.4 g/dL (ref 3.5–5.2)
Alkaline Phosphatase: 56 U/L (ref 39–117)
BUN: 23 mg/dL (ref 6–23)
CO2: 33 mEq/L — ABNORMAL HIGH (ref 19–32)
Calcium: 9.9 mg/dL (ref 8.4–10.5)
Chloride: 98 mEq/L (ref 96–112)
Creatinine, Ser: 1.09 mg/dL (ref 0.40–1.20)
GFR: 46.02 mL/min — ABNORMAL LOW (ref >60.00)
Glucose, Bld: 95 mg/dL (ref 70–99)
Potassium: 3.9 mEq/L (ref 3.5–5.1)
Sodium: 139 mEq/L (ref 135–145)
Total Bilirubin: 1.2 mg/dL (ref 0.2–1.2)
Total Protein: 7.2 g/dL (ref 6.0–8.3)

## 2021-05-18 LAB — VITAMIN D 25 HYDROXY (VIT D DEFICIENCY, FRACTURES): VITD: 93.18 ng/mL (ref 30.00–100.00)

## 2021-05-18 LAB — LIPID PANEL
Cholesterol: 188 mg/dL (ref 0–200)
HDL: 64.8 mg/dL (ref >39.00)
LDL Cholesterol: 92 mg/dL (ref 0–99)
NonHDL: 123.58
Total CHOL/HDL Ratio: 3
Triglycerides: 158 mg/dL — ABNORMAL HIGH (ref 0.0–149.0)
VLDL: 31.6 mg/dL (ref 0.0–40.0)

## 2021-05-18 LAB — TSH: TSH: 5.6 u[IU]/mL — ABNORMAL HIGH (ref 0.35–5.50)

## 2021-05-18 MED ORDER — ATORVASTATIN CALCIUM 20 MG PO TABS: 20.0000 mg | ORAL_TABLET | Freq: Every day | ORAL | 3 refills | Status: DC

## 2021-05-18 MED ORDER — LOSARTAN POTASSIUM 100 MG PO TABS: 100.0000 mg | ORAL_TABLET | Freq: Every day | ORAL | 1 refills | Status: DC

## 2021-05-18 MED ORDER — HYDROCHLOROTHIAZIDE 25 MG PO TABS: 25.0000 mg | ORAL_TABLET | Freq: Every day | ORAL | 3 refills | Status: DC

## 2021-05-18 MED ORDER — LEVOTHYROXINE SODIUM 75 MCG PO TABS: 75.0000 ug | ORAL_TABLET | Freq: Every day | ORAL | 3 refills | Status: DC

## 2021-05-18 NOTE — Assessment & Plan Note (Signed)
Update TSH on levothyroxine 75mcg daily 

## 2021-05-18 NOTE — Assessment & Plan Note (Signed)
Continue eliquis, statin. 

## 2021-05-18 NOTE — Assessment & Plan Note (Signed)
Chronic, stable. Continue current regimen. 

## 2021-05-18 NOTE — Assessment & Plan Note (Signed)
Chronic, stable on atorvastatin. Update FLP.  The ASCVD Risk score (Arnett DK, et al., 2019) failed to calculate for the following reasons:   The 2019 ASCVD risk score is only valid for ages 80 to 76

## 2021-05-18 NOTE — Assessment & Plan Note (Signed)
Appreciate cardiology care.  °

## 2021-05-18 NOTE — Assessment & Plan Note (Signed)
Update levels on 1000 IU daily.  

## 2021-05-18 NOTE — Assessment & Plan Note (Addendum)
Overall stable period off bisphosphonate. Update vit D levels on 1000 IU daily replacement. Consider repeating DEXA in 2025

## 2021-05-18 NOTE — Assessment & Plan Note (Signed)
Preventative protocols reviewed and updated unless pt declined. Discussed healthy diet and lifestyle.  

## 2021-05-18 NOTE — Assessment & Plan Note (Signed)
Chronic, stable on eliquis and diltiazem followed by cardiology.

## 2021-05-18 NOTE — Progress Notes (Signed)
Patient ID: Judy Walker, female    DOB: [birth year], 87 y.o.   MRN: [id number]  This visit was conducted in person.  BP 134/82    Pulse 86    Temp 97.7 F (36.5 C) (Temporal)    Ht 5' 6.5" (1.689 m)    Wt 158 lb 1 oz (71.7 kg)    SpO2 98%    BMI 25.13 kg/m    CC: CPE Subjective:   HPI: Judy Walker is a 87 y.o. female presenting on 05/18/2021 for Annual Exam   Saw health advisor last week for medicare wellness visit. Note reviewed.   No results found.  Flowsheet Row Clinical Support from 05/14/2021 in [facility]  PHQ-2 Total Score 0       Fall Risk  05/14/2021 05/12/2020 11/18/2019 03/22/2019 09/26/2017  Falls in the past year? 0 0 1 0 No  Number falls in past yr: 0 0 0 - -  Injury with Fall? 0 0 1 - -  Risk for fall due to : No Fall Risks Medication side effect History of fall(s) Medication side effect -  Follow up Falls prevention discussed Falls evaluation completed;Falls prevention discussed - Falls evaluation completed;Falls prevention discussed -   Son drove her here.   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 Eliquis was stopped (afib s/p cardioversion 06/2019 with recurrence), PPI started. S/p EGD showing 4cm HH, healed ulcers, likely scarring as mucosal change. Eliquis restarted 01/2020.   Known moderately severe myxomatous MR and moderate TR along with aortic stenosis. Monitoring with medical therapy. Seeing cardiology yearly.   Preventative: Colon cancer screening - has declined colonoscopy. iFOB positive 2015. We referred to Dr Carlean Purl per pt preference 2015, pt did not go. No h/o hemorrhoids. No fmhx colon cancer. Rpt iFOB returned negative (2016). Requested to stop screening. Denies blood in stool or bowel changes.  Well woman - benign hysterectomy 2003, ovarian cysts. Aged out.  Mammogram - Birads1 03/2019. Upcoming appt  scheduled 06/2021. Does breast exams at home. Sisters x2 with breast cancer.  DEXA - T score -1.9 (07/2012), T -2.3 femur (09/2015). Has been on fosamax since ~2014, stopped 2019. DEXA 03/2019 - T -2.0 hip.  Doesn't like dairy products. Takes calc supplement and vit D. Discussed weight bearing exercise.  Lung cancer screening - not eligible  Flu shot yearly COVID vaccine Pfizer 07/2019, 08/2019, booster 03/2020, bivalent booster 02/2021 Pneumovax 2006, [id number] 2015.  Td 2012.  zostavax - 09/2013.  shingrix - discussed. Declines for now.  Advanced directives - scanned and in chart 09/2017. HCPOA are son Jenny Reichmann and daughter Rise Paganini. Does not desire life prolonging measures if terminal condition Seatbelt use discussed. Sunscreen use discussed.  No suspicious moles.  Non smoker  Alcohol - rarely  Dentist - due  Eye doctor - [facility], last seen 2020 Bowel - no constipation  Bladder - no incontinence    Doesn't drive - never has. Lives alone. Widow 2012. Spends 1/2 year at [employer] Daughter lives nearby. Son also helps.  Occupation: retired Radiation protection practitioner  Activity: Regular exercise - walking 1-2 miles daily, stays active in yard.  Diet: daily fruits/vegetables, some water.       Relevant past medical, surgical, family and social history reviewed and updated as indicated. Interim medical history since our last visit reviewed. Allergies and medications reviewed and updated. Outpatient Medications Prior to Visit  Medication Sig Dispense  Refill   Ascorbic Acid (VITAMIN C GUMMIE) 120 MG CHEW Chew 250 mg by mouth daily.     Calcium Carbonate (CALCIUM 500 PO) Take 500 mg by mouth daily.     Cholecalciferol (VITAMIN D3) 25 MCG (1000 UT) CAPS Take 1 capsule (1,000 Units total) by mouth daily. 30 capsule    diltiazem (CARDIZEM CD) 240 MG 24 hr capsule Take 1 capsule (240 mg total) by mouth daily. 90 capsule 3   diphenhydrAMINE (BENADRYL) 25 mg capsule Take 25 mg by mouth at bedtime.     ELIQUIS 5 MG TABS tablet  TAKE 1 TABLET BY MOUTH TWICE A DAY 60 tablet 5   Multiple Vitamins-Minerals (MULTIVITAMIN ADULT) TABS Take 1 tablet by mouth daily.     pantoprazole (PROTONIX) 40 MG tablet Take 1 tablet (40 mg total) by mouth daily. OFFICE VISIT DUE FOR REFILLS 90 tablet 0   potassium gluconate 595 (99 K) MG TABS tablet Take 595 mg by mouth daily.     atorvastatin (LIPITOR) 20 MG tablet TAKE 1 TABLET BY MOUTH EVERY DAY 90 tablet 0   hydrochlorothiazide (HYDRODIURIL) 25 MG tablet Take 1 tablet (25 mg total) by mouth daily. 90 tablet 3   levothyroxine (SYNTHROID) 75 MCG tablet Take 1 tablet (75 mcg total) by mouth daily. 90 tablet 3   losartan (COZAAR) 100 MG tablet Take 1 tablet (100 mg total) by mouth daily. 90 tablet 1   No facility-administered medications prior to visit.     Per HPI unless specifically indicated in ROS section below Review of Systems  Constitutional:  Negative for activity change, appetite change, chills, fatigue, fever and unexpected weight change.  HENT:  Negative for hearing loss.   Eyes:  Negative for visual disturbance.  Respiratory:  Negative for cough, chest tightness, shortness of breath and wheezing.   Cardiovascular:  Positive for leg swelling (when at the beach). Negative for chest pain and palpitations.  Gastrointestinal:  Negative for abdominal distention, abdominal pain, blood in stool, constipation, diarrhea, nausea and vomiting.  Genitourinary:  Negative for difficulty urinating and hematuria.  Musculoskeletal:  Negative for arthralgias, myalgias and neck pain.  Skin:  Negative for rash.  Neurological:  Positive for light-headedness (occasional). Negative for dizziness, seizures, syncope and headaches.  Hematological:  Negative for adenopathy. Does not bruise/bleed easily.  Psychiatric/Behavioral:  Negative for dysphoric mood. The patient is not nervous/anxious.    Objective:  BP 134/82    Pulse 86    Temp 97.7 F (36.5 C) (Temporal)    Ht 5' 6.5" (1.689 m)    Wt 158  lb 1 oz (71.7 kg)    SpO2 98%    BMI 25.13 kg/m   Wt Readings from Last 3 Encounters:  05/18/21 158 lb 1 oz (71.7 kg)  05/14/21 157 lb (71.2 kg)  03/02/21 157 lb (71.2 kg)      Physical Exam Vitals and nursing note reviewed.  Constitutional:      Appearance: Normal appearance. She is not ill-appearing.  HENT:     Head: Normocephalic and atraumatic.     Right Ear: Tympanic membrane, ear canal and external ear normal. There is no impacted cerumen.     Left Ear: Tympanic membrane, ear canal and external ear normal. There is no impacted cerumen.  Eyes:     General:        Right eye: No discharge.        Left eye: No discharge.     Extraocular Movements: Extraocular movements intact.  Conjunctiva/sclera: Conjunctivae normal.     Pupils: Pupils are equal, round, and reactive to light.  Neck:     Thyroid: No thyroid mass or thyromegaly.     Vascular: Carotid bruit (bilateral - referred) present.  Cardiovascular:     Rate and Rhythm: Normal rate and regular rhythm.     Pulses: Normal pulses.     Heart sounds: Murmur (3/6 systolic at USB as well as apex) heard.  Pulmonary:     Effort: Pulmonary effort is normal. No respiratory distress.     Breath sounds: Normal breath sounds. No wheezing, rhonchi or rales.  Abdominal:     General: Bowel sounds are normal. There is no distension.     Palpations: Abdomen is soft. There is no mass.     Tenderness: There is no abdominal tenderness. There is no guarding or rebound.     Hernia: No hernia is present.  Musculoskeletal:     Cervical back: Normal range of motion and neck supple. No rigidity.     Right lower leg: No edema.     Left lower leg: No edema.  Lymphadenopathy:     Cervical: No cervical adenopathy.  Skin:    General: Skin is warm and dry.     Findings: No rash.  Neurological:     General: No focal deficit present.     Mental Status: She is alert. Mental status is at baseline.  Psychiatric:        Mood and Affect: Mood  normal.        Behavior: Behavior normal.      Results for orders placed or performed in visit on 02/10/21  ECHOCARDIOGRAM COMPLETE  Result Value Ref Range   Area-P 1/2 2.96 cm2   S' Lateral 2.00 cm   AV Area mean vel 1.07 cm2   AR max vel 1.06 cm2   AV Area VTI 1.07 cm2   Ao pk vel 2.36 m/s   AV Mean grad 12.4 mmHg   AV Peak grad 22.2 mmHg   Assessment & Plan:  This visit occurred during the SARS-CoV-2 public health emergency.  Safety protocols were in place, including screening questions prior to the visit, additional usage of staff PPE, and extensive cleaning of exam room while observing appropriate contact time as indicated for disinfecting solutions.   Problem List Items Addressed This Visit     HLD (hyperlipidemia) (Chronic)    Chronic, stable on atorvastatin. Update FLP.  The ASCVD Risk score (Arnett DK, et al., 2019) failed to calculate for the following reasons:   The 2019 ASCVD risk score is only valid for ages 9 to 50       Relevant Medications   atorvastatin (LIPITOR) 20 MG tablet   hydrochlorothiazide (HYDRODIURIL) 25 MG tablet   losartan (COZAAR) 100 MG tablet   Other Relevant Orders   Lipid panel   Comprehensive metabolic panel   Essential hypertension (Chronic)    Chronic, stable. Continue current regimen.       Relevant Medications   atorvastatin (LIPITOR) 20 MG tablet   hydrochlorothiazide (HYDRODIURIL) 25 MG tablet   losartan (COZAAR) 100 MG tablet   Health maintenance examination - Primary (Chronic)    Preventative protocols reviewed and updated unless pt declined. Discussed healthy diet and lifestyle.       History of ischemic stroke (Chronic)    Continue eliquis, statin.       Permanent atrial fibrillation (HCC) (Chronic)    Chronic, stable on eliquis and diltiazem followed by  cardiology.      Relevant Medications   atorvastatin (LIPITOR) 20 MG tablet   hydrochlorothiazide (HYDRODIURIL) 25 MG tablet   losartan (COZAAR) 100 MG tablet    Hypothyroidism    Update TSH on levothyroxine 21mcg daily.       Relevant Medications   levothyroxine (SYNTHROID) 75 MCG tablet   Other Relevant Orders   TSH   Osteopenia    Overall stable period off bisphosphonate. Update vit D levels on 1000 IU daily replacement. Consider repeating DEXA in 2025      Vitamin D deficiency    Update levels on 1000 IU daily.       Relevant Orders   VITAMIN D 25 Hydroxy (Vit-D Deficiency, Fractures)   Valvular heart disease    Appreciate cardiology care.       Relevant Medications   atorvastatin (LIPITOR) 20 MG tablet   hydrochlorothiazide (HYDRODIURIL) 25 MG tablet   losartan (COZAAR) 100 MG tablet     Meds ordered this encounter  Medications   atorvastatin (LIPITOR) 20 MG tablet    Sig: Take 1 tablet (20 mg total) by mouth daily.    Dispense:  90 tablet    Refill:  3    Hold until next refill is due   hydrochlorothiazide (HYDRODIURIL) 25 MG tablet    Sig: Take 1 tablet (25 mg total) by mouth daily.    Dispense:  90 tablet    Refill:  3   levothyroxine (SYNTHROID) 75 MCG tablet    Sig: Take 1 tablet (75 mcg total) by mouth daily.    Dispense:  90 tablet    Refill:  3   losartan (COZAAR) 100 MG tablet    Sig: Take 1 tablet (100 mg total) by mouth daily.    Dispense:  90 tablet    Refill:  1   Orders Placed This Encounter  Procedures   VITAMIN D 25 Hydroxy (Vit-D Deficiency, Fractures)   Lipid panel   Comprehensive metabolic panel   TSH    Patient instructions: Labs today  Schedule dentist and eye exam appointment.  You are doing well today Return as needed or in 1 year for next physical.   Follow up plan: Return in about 1 year (around 05/18/2022) for annual exam, prior fasting for blood work.  Ria Bush, MD

## 2021-05-18 NOTE — Patient Instructions (Addendum)
Labs today  Schedule dentist and eye exam appointment.  You are doing well today Return as needed or in 1 year for next physical.   Health Maintenance After Age 85 After age 34, you are at a higher risk for certain long-term diseases and infections as well as injuries from falls. Falls are a major cause of broken bones and head injuries in people who are older than age 39. Getting regular preventive care can help to keep you healthy and well. Preventive care includes getting regular testing and making lifestyle changes as recommended by your health care provider. Talk with your health care provider about: Which screenings and tests you should have. A screening is a test that checks for a disease when you have no symptoms. A diet and exercise plan that is right for you. What should I know about screenings and tests to prevent falls? Screening and testing are the best ways to find a health problem early. Early diagnosis and treatment give you the best chance of managing medical conditions that are common after age 19. Certain conditions and lifestyle choices may make you more likely to have a fall. Your health care provider may recommend: Regular vision checks. Poor vision and conditions such as cataracts can make you more likely to have a fall. If you wear glasses, make sure to get your prescription updated if your vision changes. Medicine review. Work with your health care provider to regularly review all of the medicines you are taking, including over-the-counter medicines. Ask your health care provider about any side effects that may make you more likely to have a fall. Tell your health care provider if any medicines that you take make you feel dizzy or sleepy. Strength and balance checks. Your health care provider may recommend certain tests to check your strength and balance while standing, walking, or changing positions. Foot health exam. Foot pain and numbness, as well as not wearing proper  footwear, can make you more likely to have a fall. Screenings, including: Osteoporosis screening. Osteoporosis is a condition that causes the bones to get weaker and break more easily. Blood pressure screening. Blood pressure changes and medicines to control blood pressure can make you feel dizzy. Depression screening. You may be more likely to have a fall if you have a fear of falling, feel depressed, or feel unable to do activities that you used to do. Alcohol use screening. Using too much alcohol can affect your balance and may make you more likely to have a fall. Follow these instructions at home: Lifestyle Do not drink alcohol if: Your health care provider tells you not to drink. If you drink alcohol: Limit how much you have to: 0-1 drink a day for women. 0-2 drinks a day for men. Know how much alcohol is in your drink. In the [place] one drink equals one 12 oz bottle of beer (355 mL), one 5 oz glass of wine (148 mL), or one 1 oz glass of hard liquor (44 mL). Do not use any products that contain nicotine or tobacco. These products include cigarettes, chewing tobacco, and vaping devices, such as e-cigarettes. If you need help quitting, ask your health care provider. Activity  Follow a regular exercise program to stay fit. This will help you maintain your balance. Ask your health care provider what types of exercise are appropriate for you. If you need a cane or walker, use it as recommended by your health care provider. Wear supportive shoes that have nonskid soles. Safety  Remove  any tripping hazards, such as rugs, cords, and clutter. Install safety equipment such as grab bars in bathrooms and safety rails on stairs. Keep rooms and walkways well-lit. General instructions Talk with your health care provider about your risks for falling. Tell your health care provider if: You fall. Be sure to tell your health care provider about all falls, even ones that seem minor. You feel dizzy,  tiredness (fatigue), or off-balance. Take over-the-counter and prescription medicines only as told by your health care provider. These include supplements. Eat a healthy diet and maintain a healthy weight. A healthy diet includes low-fat dairy products, low-fat (lean) meats, and fiber from whole grains, beans, and lots of fruits and vegetables. Stay current with your vaccines. Schedule regular health, dental, and eye exams. Summary Having a healthy lifestyle and getting preventive care can help to protect your health and wellness after age 24. Screening and testing are the best way to find a health problem early and help you avoid having a fall. Early diagnosis and treatment give you the best chance for managing medical conditions that are more common for people who are older than age 81. Falls are a major cause of broken bones and head injuries in people who are older than age 76. Take precautions to prevent a fall at home. Work with your health care provider to learn what changes you can make to improve your health and wellness and to prevent falls. This information is not intended to replace advice given to you by your health care provider. Make sure you discuss any questions you have with your health care provider. Document Revised: 10/12/2020 Document Reviewed: 10/12/2020 Elsevier Patient Education  [employer].

## 2021-05-20 ENCOUNTER — Encounter: Payer: Self-pay | Admitting: Family Medicine

## 2021-05-20 DIAGNOSIS — N183 Chronic kidney disease, stage 3 unspecified: Secondary | ICD-10-CM | POA: Insufficient documentation

## 2021-06-22 ENCOUNTER — Ambulatory Visit
Admission: RE | Admit: 2021-06-22 | Discharge: 2021-06-22 | Disposition: A | Discharge status: Home / Self Care | Payer: Medicare Other | Source: Ambulatory Visit | Attending: Family Medicine | Admitting: Family Medicine

## 2021-06-22 DIAGNOSIS — Z1231 Encounter for screening mammogram for malignant neoplasm of breast: Secondary | ICD-10-CM

## 2021-06-24 ENCOUNTER — Encounter: Payer: Self-pay | Admitting: Family Medicine

## 2021-07-16 ENCOUNTER — Other Ambulatory Visit: Payer: Self-pay | Admitting: Physician Assistant

## 2021-07-16 NOTE — Telephone Encounter (Signed)
Called and left message for pt to call back. Need an appointment

## 2021-07-20 NOTE — Telephone Encounter (Signed)
Medication denied. Patient needs to be seen.

## 2021-08-23 ENCOUNTER — Other Ambulatory Visit: Payer: Self-pay | Admitting: Physician Assistant

## 2021-08-25 ENCOUNTER — Encounter: Payer: Self-pay | Admitting: Physician Assistant

## 2021-08-25 ENCOUNTER — Ambulatory Visit: Payer: Medicare Other | Admitting: Physician Assistant

## 2021-08-25 VITALS — BP 122/84 | HR 93 | Ht 67.0 in | Wt 160.5 lb

## 2021-08-25 DIAGNOSIS — Z8711 Personal history of peptic ulcer disease: Secondary | ICD-10-CM

## 2021-08-25 DIAGNOSIS — K219 Gastro-esophageal reflux disease without esophagitis: Secondary | ICD-10-CM

## 2021-08-25 MED ORDER — PANTOPRAZOLE SODIUM 40 MG PO TBEC: 40.0000 mg | DELAYED_RELEASE_TABLET | Freq: Every day | ORAL | 3 refills | Status: DC

## 2021-08-25 NOTE — Patient Instructions (Signed)
We have sent the following medications to your pharmacy for you to pick up at your convenience:  Pantoprazole ? ?Follow up as needed ? ?If you are age 86 or older, your body mass index should be between 23-30. Your Body mass index is 25.14 kg/m?Marland Kitchen If this is out of the aforementioned range listed, please consider follow up with your Primary Care Provider. ? ?If you are age 65 or younger, your body mass index should be between 19-25. Your Body mass index is 25.14 kg/m?Marland Kitchen If this is out of the aformentioned range listed, please consider follow up with your Primary Care Provider.  ? ?________________________________________________________ ? ?The [facility] providers would like to encourage you to use [facility] to communicate with providers for non-urgent requests or questions.  Due to long hold times on the telephone, sending your provider a message by [facility] may be a faster and more efficient way to get a response.  Please allow 48 business hours for a response.  Please remember that this is for non-urgent requests.  ?_______________________________________________________  ?I appreciate the  opportunity to care for you ? ?Thank You  ? ?Anderson Malta Lemmon,PA=C   ?

## 2021-08-25 NOTE — Progress Notes (Signed)
? ?Chief Complaint: Refill Pantoprazole ? ?HPI: ?   Judy Walker is an 88 year old female with a past medical history of A-fib on Eliquis and others listed below, known to Dr. Carlean Purl, who was referred to me by Ria Bush, MD for refill of her Pantoprazole. ?   12/27/2019 EGD with healed ulcers, thickened folds of mucosa in the prepyloric region of the stomach and a 4 cm hiatal hernia.  Pathology showed healing ulcer.  Patient decreased to Pantoprazole daily and told to continue it indefinitely. ?   Today, the patient presents to clinic and tells me she is doing very well.  Her [place] decided [place] after her episode a couple years ago where she had to go to the hospital.  They told her that she is too far from the hospital if she needed it.  She tells me she takes her Pantoprazole daily and has no problems at all. ?   Planning a trip to the [facility] in [place] soon.  Has a house in [facility]. ?   Denies fever, chills, weight loss, blood in her stool, heartburn, reflux or abdominal pain. ? ?Past Medical History:  ?Diagnosis Date  ? Anemia   ? Atrial fibrillation ([place])   ? Benign positional vertigo 2005  ? Colonoscopy refused   ? " I elected not to have one" [place] reviewed 07/06/10; she continues to decline colonscopy  ? Heart murmur   ? HLD (hyperlipidemia)   ? total cholesterol 340 in 2003, NHDL 61, TG 75. NMR Lipoprofile 2005: LDL 84 (933/285), LDL glad =<120 ideally <90. Framingham study LDL goal <130  ? HTN (hypertension)   ? Hypothyroidism   ? Osteopenia 08/2012, 09/2015  ? DEXA -1.9 (2014) -2.3 (2017)  ? ? ?Past Surgical History:  ?Procedure Laterality Date  ? ABDOMINAL HYSTERECTOMY  2003  ? & BSO for cystic ovaries . No PMH of abnormal PAP  ? CARDIAC CATHETERIZATION    ? normal  ? CARDIOVERSION N/A 06/27/2019  ? Procedure: CARDIOVERSION;  Surgeon: Jerline Pain, MD;  Location: [facility] ENDOSCOPY;  Service: Cardiovascular;  Laterality: N/A;  ?  CATARACT EXTRACTION, BILATERAL  09/2011  ? Dr.[name]   ? CYSTOSCOPY    ? bladder @ age 6 for painful hematuria  ? ESOPHAGOGASTRODUODENOSCOPY  12/2019  ? prepyloric scarring, 4cm HH, biopsies with healing ulcer, neg H pylori Carlean Purl)  ? ESOPHAGOGASTRODUODENOSCOPY  11/2019  ? UGI bleed - mass present @ [place] [facility]  ? ? ?Current Outpatient Medications  ?Medication Sig Dispense Refill  ? Ascorbic Acid (VITAMIN C GUMMIE) 120 MG CHEW Chew 250 mg by mouth daily.    ? atorvastatin (LIPITOR) 20 MG tablet Take 1 tablet (20 mg total) by mouth daily. 90 tablet 3  ? Calcium Carbonate (CALCIUM 500 PO) Take 500 mg by mouth daily.    ? Cholecalciferol (VITAMIN D3) 25 MCG (1000 UT) CAPS Take 1 capsule (1,000 Units total) by mouth daily. 30 capsule   ? diltiazem (CARDIZEM CD) 240 MG 24 hr capsule Take 1 capsule (240 mg total) by mouth daily. 90 capsule 3  ? diphenhydrAMINE (BENADRYL) 25 mg capsule Take 25 mg by mouth at bedtime.    ? ELIQUIS 5 MG TABS tablet TAKE 1 TABLET BY MOUTH TWICE A DAY 60 tablet 5  ? hydrochlorothiazide (HYDRODIURIL) 25 MG tablet Take 1 tablet (25 mg total) by mouth daily. 90 tablet 3  ? levothyroxine (SYNTHROID) 75 MCG tablet Take  1 tablet (75 mcg total) by mouth daily. 90 tablet 3  ? losartan (COZAAR) 100 MG tablet Take 1 tablet (100 mg total) by mouth daily. 90 tablet 1  ? Multiple Vitamins-Minerals (MULTIVITAMIN ADULT) TABS Take 1 tablet by mouth daily.    ? pantoprazole (PROTONIX) 40 MG tablet Take 1 tablet (40 mg total) by mouth daily. **KEEP OFFICE VISIT FOR FULL 90 DAYS 30 tablet 0  ? potassium gluconate 595 (99 K) MG TABS tablet Take 595 mg by mouth daily.    ? ?No current facility-administered medications for this visit.  ? ? ?Allergies as of 08/25/2021  ? (No Known Allergies)  ? ? ?Family History  ?Problem Relation Age of Onset  ? Kidney cancer Mother   ? Diabetes Father   ? Heart attack Father   ?     in 73s  ? Breast cancer Sister   ? Stroke Sister   ? Cirrhosis Sister   ?     veins  came appart and had to be glued back together  ? Breast cancer Sister   ?     mets  ? Breast cancer Sister   ?     15s  ? Ovarian cancer Sister   ? Alzheimer's disease Sister   ? Cancer Maternal Grandmother   ?     unknown type  ? Diabetes Paternal Grandfather   ? Heart attack Brother   ?     1 pre 55  ? Brain cancer Brother   ? Heart attack Brother   ? Other Brother   ?     died at birth  ? Other Brother   ?     died at birth  ? Heart disease Brother   ? Ulcerative colitis Son   ? Diabetes Other   ?      4 brothers and  3 sisters  ? ? ?Social History  ? ?Socioeconomic History  ? Marital status: Widowed  ?  Spouse name: Not on file  ? Number of children: 2  ? Years of education: Not on file  ? Highest education level: Not on file  ?Occupational History  ? Occupation: retired  ?Tobacco Use  ? Smoking status: Never  ? Smokeless tobacco: Never  ?Vaping Use  ? Vaping Use: Never used  ?Substance and Sexual Activity  ? Alcohol use: Yes  ?  Comment:  RARELY  ? Drug use: No  ? Sexual activity: Not on file  ?Other Topics Concern  ? Not on file  ?Social History Narrative  ? Lives alone. Widow 2012  ? Daughter lives nearby.  ? Occupation: retired Clinical cytogeneticist  ? Was star basketball player growing up  ? Activity: Regular exercise - walking 1-2 miles daily, stays active in yard.  ? Diet: daily fruits/vegetables, some water  ? ?Social Determinants of Health  ? ?Financial Resource Strain: Low Risk   ? Difficulty of Paying Living Expenses: Not hard at all  ?Food Insecurity: No Food Insecurity  ? Worried About Charity fundraiser in the Last Year: Never true  ? Ran Out of Food in the Last Year: Never true  ?Transportation Needs: No Transportation Needs  ? Lack of Transportation (Medical): No  ? Lack of Transportation (Non-Medical): No  ?Physical Activity: Insufficiently Active  ? Days of Exercise per Week: 3 days  ? Minutes of Exercise per Session: 10 min  ?Stress: No Stress Concern Present  ? Feeling of Stress : Not at all  ?Social  Connections: Moderately Isolated  ? Frequency of Communication with Friends and Family: More than three times a week  ? Frequency of Social Gatherings with Friends and Family: Twice a week  ? Attends Religious Services: More than 4 times per year  ? Active Member of Clubs or Organizations: No  ? Attends Archivist Meetings: Never  ? Marital Status: Widowed  ?Intimate Partner Violence: Not At Risk  ? Fear of Current or Ex-Partner: No  ? Emotionally Abused: No  ? Physically Abused: No  ? Sexually Abused: No  ? ? ?Review of Systems:    ?Constitutional: No weight loss, fever or chills ?Cardiovascular: No chest pain ?Respiratory: No SOB ?Gastrointestinal: See HPI and otherwise negative ? ? Physical Exam:  ?Vital signs: ?BP 122/84 (BP Location: Left Arm, Patient Position: Sitting, Cuff Size: Normal)   Pulse 93   Ht '5\' 7"'$  (1.702 m)   Wt 160 lb 8 oz (72.8 kg)   SpO2 98%   BMI 25.14 kg/m?   ? ?Constitutional:   Pleasant Elderly Caucasian female appears to be in NAD, Well developed, Well nourished, alert and cooperative ?Respiratory: Respirations even and unlabored. Lungs clear to auscultation bilaterally.   No wheezes, crackles, or rhonchi.  ?Cardiovascular: Normal S1, S2. No MRG. Regular rate and rhythm. No peripheral edema, cyanosis or pallor.  ?Gastrointestinal:  Soft, nondistended, nontender. No rebound or guarding. Normal bowel sounds. No appreciable masses or hepatomegaly. ?Rectal:  Not performed.  ?Psychiatric: Oriented to person, place and time. Demonstrates good judgement and reason without abnormal affect or behaviors. ? ?RELEVANT LABS AND IMAGING: ?CBC ?   ?Component Value Date/Time  ? WBC 4.7 05/15/2020 1233  ? RBC 4.07 05/15/2020 1233  ? HGB 13.7 05/15/2020 1233  ? HGB 12.9 02/13/2020 1502  ? HCT 40.5 05/15/2020 1233  ? HCT 37.8 02/13/2020 1502  ? PLT 236.0 05/15/2020 1233  ? PLT 266 02/13/2020 1502  ? MCV 99.7 05/15/2020 1233  ? MCV 97 02/13/2020 1502  ? MCH 33.2 (H) 02/13/2020 1502  ? MCHC 33.9  05/15/2020 1233  ? RDW 13.5 05/15/2020 1233  ? RDW 12.6 02/13/2020 1502  ? LYMPHSABS 1.2 05/15/2020 1233  ? MONOABS 0.5 05/15/2020 1233  ? EOSABS 0.1 05/15/2020 1233  ? BASOSABS 0.0 05/15/2020 1233  ? ? ?CMP

## 2021-09-20 DIAGNOSIS — E7800 Pure hypercholesterolemia, unspecified: Secondary | ICD-10-CM | POA: Insufficient documentation

## 2021-09-20 NOTE — Progress Notes (Addendum)
?Cardiology Office Note:   ? ?Date:  09/21/2021  ? ?ID:  Judy Walker, DOB [birth year], MRN [id number] ? ?PCP:  Ria Bush, MD  ?[facility] Providers ?Cardiologist:  [name], MD ?Structural Heart:  Sherren Mocha, MD   ?Referring MD: Ria Bush, MD  ? ?Chief Complaint:  F/u for CHF, MR ?  ? ?Patient Profile: ?Permanent atrial fibrillation  ?S/p DCCV in Jan 2021 >> recurrent A-fib ?Aortic stenosis ?Echo 9/22: Mean gradient 12.4 mmHg, V-max 236 cm/s, DI 0.34 ?Mitral regurgitation ?Echo 9/22: Moderate to severe MR ?Eval w structural heart (Dr. Burt Knack) 02/2021 >> med Rx w f/u 1 year ?Hx CVA ?Carotid artery disease ?[place] 7/22: bilat ICA 1-39 ?Hypertension  ?Hyperlipidemia  ?hypothyroidism ?Hx of GI bleed ?BPPV ? ?Prior CV Studies: ?Echocardiogram 02/10/2021 ?EF 60-65, no RWMA, mild concentric LVH, normal RVSF, normal PASP, RVSP 29.4, mild-moderate BAE, myxomatous MV with moderate to severe MR, mild to moderate TR (myxomatous), severe calcification of AV, mild aortic stenosis (mean 12.4 mmHg, V-max 236 cm/s, DI 0.34) ? ?Carotid US 12/09/2020 ?Bilateral ICA 1-39 ? ?Cardiac catheterization 11/11/2003 ?EF 60 ?LM normal ?LAD normal ?LCx normal ?RCA normal ? ?  ? ?History of Present Illness:   ?Judy Walker is a 89 y.o. female with the above problem list.  She was last seen by Dr. Marlou Porch in August 2022.  A follow-up echocardiogram demonstrated stable mild aortic stenosis.  However, mitral regurgitation was worse in the moderate to severe range.  She was referred to Dr. Burt Knack with structural heart clinic for further evaluation.  She saw Dr. Burt Knack in September 2022.  She was felt to have good functional capacity at her advanced age.  Considering her multivalve disease, it was decided to continue with medical therapy.  She will be seen again in 1 year with a repeat echocardiogram for continued surveillance. ? ?She returns for follow-up.  She is here alone.  She is doing well without chest discomfort,  syncope, orthopnea, leg edema.  She has not had significant dyspnea with exertion.  She remains very active without significant limitations. ?   ?Past Medical History:  ?Diagnosis Date  ? Anemia   ? Atrial fibrillation ([place])   ? Benign positional vertigo 2005  ? Colonoscopy refused   ? " I elected not to have one" [place] reviewed 07/06/10; she continues to decline colonscopy  ? Heart murmur   ? HLD (hyperlipidemia)   ? total cholesterol 340 in 2003, NHDL 61, TG 75. NMR Lipoprofile 2005: LDL 84 (933/285), LDL glad =<120 ideally <90. Framingham study LDL goal <130  ? HTN (hypertension)   ? Hypothyroidism   ? Osteopenia 08/2012, 09/2015  ? DEXA -1.9 (2014) -2.3 (2017)  ? ?Current Medications: ?Current Meds  ?Medication Sig  ? Ascorbic Acid (VITAMIN C GUMMIE) 120 MG CHEW Chew 250 mg by mouth daily.  ? atorvastatin (LIPITOR) 20 MG tablet Take 1 tablet (20 mg total) by mouth daily.  ? Calcium Carbonate (CALCIUM 500 PO) Take 500 mg by mouth daily.  ? Cholecalciferol (VITAMIN D3) 25 MCG (1000 UT) CAPS Take 1 capsule (1,000 Units total) by mouth daily.  ? diltiazem (CARDIZEM CD) 240 MG 24 hr capsule Take 1 capsule (240 mg total) by mouth daily.  ? diphenhydrAMINE (BENADRYL) 25 mg capsule Take 25 mg by mouth at bedtime.  ? ELIQUIS 5 MG TABS tablet TAKE 1 TABLET BY MOUTH TWICE A DAY  ? hydrochlorothiazide (HYDRODIURIL) 25 MG tablet Take 1 tablet (25 mg total) by mouth daily.  ?  levothyroxine (SYNTHROID) 75 MCG tablet Take 1 tablet (75 mcg total) by mouth daily.  ? losartan (COZAAR) 100 MG tablet Take 1 tablet (100 mg total) by mouth daily.  ? Multiple Vitamins-Minerals (MULTIVITAMIN ADULT) TABS Take 1 tablet by mouth daily.  ? pantoprazole (PROTONIX) 40 MG tablet Take 1 tablet (40 mg total) by mouth daily.  ? potassium gluconate 595 (99 K) MG TABS tablet Take 595 mg by mouth daily.  ?  ?Allergies:   Patient has no known allergies.  ? ?Social History  ? ?Tobacco Use  ? Smoking status: Never  ? Smokeless tobacco: Never  ?Vaping Use  ?  Vaping Use: Never used  ?Substance Use Topics  ? Alcohol use: Yes  ?  Comment:  RARELY  ? Drug use: No  ?  ?Family Hx: ?The patient's family history includes Alzheimer's disease in her sister; Brain cancer in her brother; Breast cancer in her sister, sister, and sister; Cancer in her maternal grandmother; Cirrhosis in her sister; Diabetes in her father, paternal grandfather, and another family member; Heart attack in her brother, brother, and father; Heart disease in her brother; Kidney cancer in her mother; Other in her brother and brother; Ovarian cancer in her sister; Stroke in her sister; Ulcerative colitis in her son. ? ?Review of Systems  ?Gastrointestinal:  Negative for hematochezia and melena.  ?Genitourinary:  Negative for hematuria.   ? ?EKGs/Labs/Other Test Reviewed:   ? ?EKG:  EKG is not ordered today.  The ekg ordered today demonstrates n/a ? ?Recent Labs: ?05/18/2021: ALT 14; BUN 23; Creatinine, Ser 1.09; Potassium 3.9; Sodium 139; TSH 5.60  ? ?Recent Lipid Panel ?Recent Labs  ?  05/18/21 ?1051  ?CHOL 188  ?TRIG 158.0*  ?HDL 64.80  ?VLDL 31.6  ?[place] 92  ?  ? ?Risk Assessment/Calculations:   ? ?CHA2DS2-VASc Score = 7  ? This indicates a 11.2% annual risk of stroke. ?The patient's score is based upon: ?CHF History: 0 ?HTN History: 1 ?Diabetes History: 0 ?Stroke History: 2 ?Vascular Disease History: 1 ?Age Score: 2 ?Gender Score: 1 ?  ? ?    ?Physical Exam:   ? ?VS:  BP 132/82   Pulse 88   Ht 5' 0.5" (1.537 m)   Wt 162 lb 3.2 oz (73.6 kg)   SpO2 96%   BMI 31.16 kg/m?    ? ?Wt Readings from Last 3 Encounters:  ?09/21/21 162 lb 3.2 oz (73.6 kg)  ?08/25/21 160 lb 8 oz (72.8 kg)  ?05/18/21 158 lb 1 oz (71.7 kg)  ?  ?Constitutional:   ?   Appearance: Healthy appearance. Not in distress.  ?Neck:  ?   Vascular: No JVR. JVD normal.  ?Pulmonary:  ?   Effort: Pulmonary effort is normal.  ?   Breath sounds: No wheezing. No rales.  ?Cardiovascular:  ?   Normal rate. Irregularly irregular rhythm. Normal S1.  Normal S2.   ?Edema: ?   Peripheral edema absent.  ?Abdominal:  ?   Palpations: Abdomen is soft.  ?Skin: ?   General: Skin is warm and dry.  ?Neurological:  ?   Mental Status: Alert and oriented to person, place and time.  ?   Cranial Nerves: Cranial nerves are intact.  ?  ?    ?ASSESSMENT & PLAN:   ?Carotid stenosis, bilateral ?Minimal plaque on carotid ultrasound in July 2022.  Continue Lipitor 20 mg daily. ? ?Essential hypertension ?Blood pressure well controlled.  Continue Cardizem to 40 mg daily, HCTZ 25 mg daily,  losartan 100 mg daily. ? ?Permanent atrial fibrillation ([place]) ?Rate is well controlled.  She is tolerating anticoagulation.  Creatinine has been <1.5 and weight >60 kg.  Continue Eliquis 5 mg twice daily, Cardizem CD 240 mg daily.  Obtain follow-up CBC today. ? ?Pure hypercholesterolemia ?Reasonable control.  Continue Lipitor 20 mg daily. ? ?Valvular heart disease ?Echocardiogram in moderate to severe mitral regurgitation, mild to moderate tricuspid regurgitation and mild aortic stenosis.  Overall, she is doing well without significant symptoms.  She has seen Dr. Burt Knack who has recommended continued medical therapy for now.  Continue to monitor valve disease with serial echocardiograms.  She will follow-up with Dr. Burt Knack in 6 months with a follow-up echocardiogram. ?  ?     ?   ?Dispo:  Return in about 6 months (around 03/23/2022) for Routine Follow Up, w/ Dr. Burt Knack and see Dr. Marlou Porch in 1 year..  ? ?Medication Adjustments/Labs and Tests Ordered: ?Current medicines are reviewed at length with the patient today.  Concerns regarding medicines are outlined above.  ?Tests Ordered: ?Orders Placed This Encounter  ?Procedures  ? CBC  ? ECHOCARDIOGRAM COMPLETE  ? ?Medication Changes: ?No orders of the defined types were placed in this encounter. ? ?Signed, ?Richardson Dopp, PA-C  ?09/21/2021 5:36 PM    ?[facility] ?[address], [place], [place]  [address] ?Phone: [phone]; Fax:  [phone]  ?

## 2021-09-21 ENCOUNTER — Encounter: Payer: Self-pay | Admitting: Physician Assistant

## 2021-09-21 ENCOUNTER — Ambulatory Visit: Payer: Medicare Other | Admitting: Physician Assistant

## 2021-09-21 VITALS — BP 132/82 | HR 88 | Ht 60.5 in | Wt 162.2 lb

## 2021-09-21 DIAGNOSIS — I38 Endocarditis, valve unspecified: Secondary | ICD-10-CM

## 2021-09-21 DIAGNOSIS — I6523 Occlusion and stenosis of bilateral carotid arteries: Secondary | ICD-10-CM | POA: Diagnosis not present

## 2021-09-21 DIAGNOSIS — E7800 Pure hypercholesterolemia, unspecified: Secondary | ICD-10-CM | POA: Diagnosis not present

## 2021-09-21 DIAGNOSIS — I340 Nonrheumatic mitral (valve) insufficiency: Secondary | ICD-10-CM

## 2021-09-21 DIAGNOSIS — I4821 Permanent atrial fibrillation: Secondary | ICD-10-CM

## 2021-09-21 DIAGNOSIS — I10 Essential (primary) hypertension: Secondary | ICD-10-CM | POA: Diagnosis not present

## 2021-09-21 LAB — CBC
Hematocrit: 40.2 % (ref 34.0–46.6)
Hemoglobin: 13.9 g/dL (ref 11.1–15.9)
MCH: 34.7 pg — ABNORMAL HIGH (ref 26.6–33.0)
MCHC: 34.6 g/dL (ref 31.5–35.7)
MCV: 100 fL — ABNORMAL HIGH (ref 79–97)
Platelets: 242 10*3/uL (ref 150–450)
RBC: 4.01 x10E6/uL (ref 3.77–5.28)
RDW: 12.5 % (ref 11.7–15.4)
WBC: 5.9 10*3/uL (ref 3.4–10.8)

## 2021-09-21 NOTE — Assessment & Plan Note (Signed)
Blood pressure well controlled.  Continue Cardizem to 40 mg daily, HCTZ 25 mg daily, losartan 100 mg daily. ?

## 2021-09-21 NOTE — Assessment & Plan Note (Signed)
Minimal plaque on carotid ultrasound in July 2022.  Continue Lipitor 20 mg daily. ?

## 2021-09-21 NOTE — Assessment & Plan Note (Signed)
Echocardiogram in moderate to severe mitral regurgitation, mild to moderate tricuspid regurgitation and mild aortic stenosis.  Overall, she is doing well without significant symptoms.  She has seen Dr. Burt Knack who has recommended continued medical therapy for now.  Continue to monitor valve disease with serial echocardiograms.  She will follow-up with Dr. Burt Knack in 6 months with a follow-up echocardiogram. ?

## 2021-09-21 NOTE — Assessment & Plan Note (Signed)
Reasonable control.  Continue Lipitor 20 mg daily. ?

## 2021-09-21 NOTE — Assessment & Plan Note (Addendum)
Rate is well controlled.  She is tolerating anticoagulation.  Creatinine has been <1.5 and weight >60 kg.  Continue Eliquis 5 mg twice daily, Cardizem CD 240 mg daily.  Obtain follow-up CBC today. ?

## 2021-09-21 NOTE — Patient Instructions (Addendum)
Medication Instructions:  ?Your physician recommends that you continue on your current medications as directed. Please refer to the Current Medication list given to you today. ? ?*If you need a refill on your cardiac medications before your next appointment, please call your pharmacy* ? ? ?Lab Work: ?TODAY:  CBC ? ?If you have labs (blood work) drawn today and your tests are completely normal, you will receive your results only by: ?MyChart Message (if you have MyChart) OR ?A paper copy in the mail ?If you have any lab test that is abnormal or we need to change your treatment, we will call you to review the results. ? ? ?Testing/Procedures: ?Your physician has requested that you have an echocardiogram in September. Echocardiography is a painless test that uses sound waves to create images of your heart. It provides your doctor with information about the size and shape of your heart and how well your heart?s chambers and valves are working. This procedure takes approximately one hour. There are no restrictions for this procedure. ? ? ? ?Follow-Up: ?At [facility], you and your health needs are our priority.  As part of our continuing mission to provide you with exceptional heart care, we have created designated Provider Care Teams.  These Care Teams include your primary Cardiologist (physician) and Advanced Practice Providers (APPs -  Physician Assistants and Nurse Practitioners) who all work together to provide you with the care you need, when you need it. ? ?We recommend signing up for the patient portal called "MyChart".  Sign up information is provided on this After Visit Summary.  MyChart is used to connect with patients for Virtual Visits (Telemedicine).  Patients are able to view lab/test results, encounter notes, upcoming appointments, etc.  Non-urgent messages can be sent to your provider as well.   ?To learn more about what you can do with MyChart, go to [email or web address].   ? ?Your next  appointment:   ?5 month(s) ?12 months with Dr. Marlou Porch ? ?The format for your next appointment:   ?In Person ? ?Provider:   ?Dr. Sherren Mocha   ? ? ?Other Instructions ? ? ?Important Information About Sugar ? ? ? ? ?  ?

## 2021-09-22 NOTE — Progress Notes (Signed)
Pt has been made aware of normal result and verbalized understanding.  jw

## 2021-11-20 ENCOUNTER — Other Ambulatory Visit: Payer: Self-pay | Admitting: Family Medicine

## 2021-11-20 ENCOUNTER — Other Ambulatory Visit: Payer: Self-pay | Admitting: Cardiology

## 2021-11-23 NOTE — Telephone Encounter (Signed)
Prescription refill request for Eliquis received. Indication:Afib Last office visit:4/23 Scr:1.0 Age: 88 Weight:73.6 kg  Prescription refilled

## 2022-02-15 ENCOUNTER — Other Ambulatory Visit (HOSPITAL_COMMUNITY): Payer: Medicare Other

## 2022-02-18 ENCOUNTER — Encounter: Payer: Self-pay | Admitting: Cardiovascular Disease

## 2022-02-18 ENCOUNTER — Ambulatory Visit (HOSPITAL_COMMUNITY): Payer: Medicare Other | Attending: Cardiovascular Disease | Admitting: Cardiovascular Disease

## 2022-02-18 ENCOUNTER — Ambulatory Visit (HOSPITAL_BASED_OUTPATIENT_CLINIC_OR_DEPARTMENT_OTHER): Payer: Medicare Other

## 2022-02-18 VITALS — BP 152/92 | HR 89 | Ht 67.5 in | Wt 159.6 lb

## 2022-02-18 DIAGNOSIS — I350 Nonrheumatic aortic (valve) stenosis: Secondary | ICD-10-CM | POA: Insufficient documentation

## 2022-02-18 DIAGNOSIS — I340 Nonrheumatic mitral (valve) insufficiency: Secondary | ICD-10-CM | POA: Insufficient documentation

## 2022-02-18 DIAGNOSIS — I4821 Permanent atrial fibrillation: Secondary | ICD-10-CM | POA: Diagnosis not present

## 2022-02-18 LAB — ECHOCARDIOGRAM COMPLETE
AR max vel: 0.78 cm2
AV Area VTI: 0.7 cm2
AV Area mean vel: 0.72 cm2
AV Mean grad: 15.6 mmHg
AV Peak grad: 25.6 mmHg
Ao pk vel: 2.53 m/s
Area-P 1/2: 3.18 cm2
S' Lateral: 2.1 cm

## 2022-02-18 NOTE — Patient Instructions (Signed)
Medication Instructions:  Your physician recommends that you continue on your current medications as directed. Please refer to the Current Medication list given to you today.  *If you need a refill on your cardiac medications before your next appointment, please call your pharmacy*   Lab Work: NONE  If you have labs (blood work) drawn today and your tests are completely normal, you will receive your results only by: MyChart Message (if you have MyChart) OR A paper copy in the mail If you have any lab test that is abnormal or we need to change your treatment, we will call you to review the results.   Testing/Procedures: NONE   Follow-Up: At [facility], you and your health needs are our priority.  As part of our continuing mission to provide you with exceptional heart care, we have created designated Provider Care Teams.  These Care Teams include your primary Cardiologist (physician) and Advanced Practice Providers (APPs -  Physician Assistants and Nurse Practitioners) who all work together to provide you with the care you need, when you need it.    Your next appointment:   6 month(s)  The format for your next appointment:   In Person  Provider:   Mark Skains, MD     Important Information About Sugar       

## 2022-02-18 NOTE — Progress Notes (Signed)
Cardiology Office Note:    Date:  02/18/2022   ID:  EARLY ORD, DOB Oct 20, 1932, MRN [id number]  PCP:  Ria Bush, [place] [facility] Cardiologist:  [name], MD Structural Heart:  Sherren Mocha, MD    Referring MD: Ria Bush, MD   Chief Complaint  Patient presents with   Mitral Regurgitation    History of Present Illness:    Judy Walker is a 88 y.o. female with a hx of: Permanent atrial fibrillation  S/p DCCV in Jan 2021 >> recurrent A-fib Aortic stenosis Echo 9/22: Mean gradient 12.4 mmHg, V-max 236 cm/s, DI 0.34 Mitral regurgitation Echo 9/22: Moderate to severe MR Eval w structural heart (Dr. Burt Knack) 02/2021 >> med Rx w f/u 1 year Hx CVA Carotid artery disease [place] 7/22: bilat ICA 1-39 Hypertension  Hyperlipidemia  hypothyroidism Hx of GI bleed BPPV  The patient is here alone today.  She is doing very well and denies any change in symptoms over the past year.  She remains remarkably active.  She is headed to the beach this weekend with her son.  They have a place in [facility] and she is excited to spend the week there with her friends.  She has stable, mild dyspnea with exertion that she experiences with certain housework such as using a vacuum.  She has had no chest pain or chest pressure.  She denies edema, heart palpitations, orthopnea, or PND.  She feels well and denies any interval health problems over the past year.  Past Medical History:  Diagnosis Date   Anemia    Atrial fibrillation ([place])    Benign positional vertigo 2005   Colonoscopy refused    " I elected not to have one" [place] reviewed 07/06/10; she continues to decline colonscopy   Heart murmur    HLD (hyperlipidemia)    total cholesterol 340 in 2003, NHDL 61, TG 75. NMR Lipoprofile 2005: LDL 84 (933/285), LDL glad =<120 ideally <90. Framingham study LDL goal <130   HTN (hypertension)    Hypothyroidism    Osteopenia 08/2012, 09/2015   DEXA -1.9 (2014)  -2.3 (2017)    Past Surgical History:  Procedure Laterality Date   ABDOMINAL HYSTERECTOMY  2003   & BSO for cystic ovaries . No PMH of abnormal PAP   CARDIAC CATHETERIZATION     normal   CARDIOVERSION N/A 06/27/2019   Procedure: CARDIOVERSION;  Surgeon: Jerline Pain, MD;  Location: [facility];  Service: Cardiovascular;  Laterality: N/A;   CATARACT EXTRACTION, BILATERAL  09/2011   Dr.[name]    CYSTOSCOPY     bladder @ age 61 for painful hematuria   ESOPHAGOGASTRODUODENOSCOPY  12/2019   prepyloric scarring, 4cm HH, biopsies with healing ulcer, neg H pylori Carlean Purl)   ESOPHAGOGASTRODUODENOSCOPY  11/2019   UGI bleed - mass present @ [facility]    Current Medications: Current Meds  Medication Sig   Ascorbic Acid (VITAMIN C GUMMIE) 120 MG CHEW Chew 250 mg by mouth daily.   atorvastatin (LIPITOR) 20 MG tablet Take 1 tablet (20 mg total) by mouth daily.   Calcium Carbonate (CALCIUM 500 PO) Take 500 mg by mouth daily.   Cholecalciferol (VITAMIN D3) 25 MCG (1000 UT) CAPS Take 1 capsule (1,000 Units total) by mouth daily.   diltiazem (CARDIZEM CD) 240 MG 24 hr capsule TAKE 1 CAPSULE BY MOUTH EVERY DAY   diphenhydrAMINE (BENADRYL) 25 mg capsule Take 25 mg by mouth at bedtime.   ELIQUIS 5 MG  TABS tablet TAKE 1 TABLET BY MOUTH TWICE A DAY   hydrochlorothiazide (HYDRODIURIL) 25 MG tablet Take 1 tablet (25 mg total) by mouth daily.   levothyroxine (SYNTHROID) 75 MCG tablet Take 1 tablet (75 mcg total) by mouth daily.   losartan (COZAAR) 100 MG tablet TAKE 1 TABLET BY MOUTH EVERY DAY   Multiple Vitamins-Minerals (MULTIVITAMIN ADULT) TABS Take 1 tablet by mouth daily.   pantoprazole (PROTONIX) 40 MG tablet Take 1 tablet (40 mg total) by mouth daily.   potassium gluconate 595 (99 K) MG TABS tablet Take 595 mg by mouth daily.     Allergies:   Patient has no known allergies.   Social History   Socioeconomic History   Marital status: Widowed    Spouse name: Not on file    Number of children: 2   Years of education: Not on file   Highest education level: Not on file  Occupational History   Occupation: retired  Tobacco Use   Smoking status: Never   Smokeless tobacco: Never  Vaping Use   Vaping Use: Never used  Substance and Sexual Activity   Alcohol use: Yes    Comment:  RARELY   Drug use: No   Sexual activity: Not on file  Other Topics Concern   Not on file  Social History Narrative   Lives alone. Widow 2012   Daughter lives nearby.   Occupation: retired Clinical cytogeneticist   Was Programmer, multimedia growing up   Activity: Regular exercise - walking 1-2 miles daily, stays active in yard.   Diet: daily fruits/vegetables, some water   Social Determinants of Health   Financial Resource Strain: Low Risk  (05/14/2021)   Overall Financial Resource Strain (CARDIA)    Difficulty of Paying Living Expenses: Not hard at all  Food Insecurity: No Food Insecurity (05/14/2021)   Hunger Vital Sign    Worried About Running Out of Food in the Last Year: Never true    Ran Out of Food in the Last Year: Never true  Transportation Needs: No Transportation Needs (05/14/2021)   PRAPARE - Hydrologist (Medical): No    Lack of Transportation (Non-Medical): No  Physical Activity: Insufficiently Active (05/14/2021)   Exercise Vital Sign    Days of Exercise per Week: 3 days    Minutes of Exercise per Session: 10 min  Stress: No Stress Concern Present (05/14/2021)   [place]    Feeling of Stress : Not at all  Social Connections: Moderately Isolated (05/14/2021)   Social Connection and Isolation Panel [NHANES]    Frequency of Communication with Friends and Family: More than three times a week    Frequency of Social Gatherings with Friends and Family: Twice a week    Attends Religious Services: More than 4 times per year    Active Member of [employer] or Organizations: No    Attends English as a second language teacher Meetings: Never    Marital Status: Widowed     Family History: The patient's family history includes Alzheimer's disease in her sister; Brain cancer in her brother; Breast cancer in her sister, sister, and sister; Cancer in her maternal grandmother; Cirrhosis in her sister; Diabetes in her father, paternal grandfather, and another family member; Heart attack in her brother, brother, and father; Heart disease in her brother; Kidney cancer in her mother; Other in her brother and brother; Ovarian cancer in her sister; Stroke in her sister; Ulcerative colitis in  her son.  ROS:   Please see the history of present illness.    All other systems reviewed and are negative.  EKGs/Labs/Other Studies Reviewed:    The following studies were reviewed today: 2D echocardiogram performed today is personally reviewed.  The patient has normal LV function, normal RV function, 3+ mitral regurgitation with heavy posterior mitral annular calcification, and moderate aortic stenosis.  EKG:  EKG is ordered today.  The ekg ordered today demonstrates atrial fibrillation heart rate 91 bpm, rightward axis  Recent Labs: 05/18/2021: ALT 14; BUN 23; Creatinine, Ser 1.09; Potassium 3.9; Sodium 139; TSH 5.60 09/21/2021: Hemoglobin 13.9; Platelets 242  Recent Lipid Panel    Component Value Date/Time   CHOL 188 05/18/2021 1051   TRIG 158.0 (H) 05/18/2021 1051   TRIG 63 05/17/2006 1034   HDL 64.80 05/18/2021 1051   CHOLHDL 3 05/18/2021 1051   VLDL 31.6 05/18/2021 1051   LDLCALC 92 05/18/2021 1051   LDLDIRECT 97.0 09/10/2015 1158     Risk Assessment/Calculations:    CHA2DS2-VASc Score = 7   This indicates a 11.2% annual risk of stroke. The patient's score is based upon: CHF History: 0 HTN History: 1 Diabetes History: 0 Stroke History: 2 Vascular Disease History: 1 Age Score: 2 Gender Score: 1     HYPERTENSION CONTROL Vitals:   02/18/22 1142 02/18/22 1215  BP: (!) 150/94 (!) 152/92     The patient's blood pressure is elevated above target today.  In order to address the patient's elevated BP: The blood pressure is usually elevated in clinic.  Blood pressures monitored at home have been optimal.            Physical Exam:    VS:  BP (!) 152/92   Pulse 89   Ht 5' 7.5" (1.715 m)   Wt 159 lb 9.6 oz (72.4 kg)   SpO2 95%   BMI 24.63 kg/m     Wt Readings from Last 3 Encounters:  02/18/22 159 lb 9.6 oz (72.4 kg)  09/21/21 162 lb 3.2 oz (73.6 kg)  08/25/21 160 lb 8 oz (72.8 kg)     GEN:  Well nourished, well developed in no acute distress HEENT: Normal NECK: No JVD; No carotid bruits LYMPHATICS: No lymphadenopathy CARDIAC: Irregularly irregular with 2/6 outflow murmur heard throughout the precordium RESPIRATORY:  Clear to auscultation without rales, wheezing or rhonchi  ABDOMEN: Soft, non-tender, non-distended MUSCULOSKELETAL: Trace left ankle edema; No deformity  SKIN: Warm and dry NEUROLOGIC:  Alert and oriented x 3 PSYCHIATRIC:  Normal affect   ASSESSMENT:    1. Nonrheumatic mitral valve regurgitation   2. Permanent atrial fibrillation ([place])   3. Moderate aortic stenosis by prior echocardiogram    PLAN:    In order of problems listed above:  The patient appears to have stable valvular heart disease.  Today's echo interpretation is currently pending, but I personally reviewed all of the images.  She reports stable New York Heart Association functional class II symptoms of chronic exertional dyspnea.  She has no signs of volume overload on exam.  There is no change in her functional capacity.  By my assessment, her mitral regurgitation looks to be in the 3+ moderate to severe range unchanged from her previous study.  She does have a calcified aortic valve with some leaflet restriction and Doppler findings consistent with moderate aortic stenosis.  She will continue to follow with Dr. Marlou Porch on an annual basis.  We will set her up to see him in  6 months.  I  would be happy to see her back in the future if problems arise, but at this time her valvular disease appears stable.  If she develops symptomatic aortic stenosis in the future, she would likely be a good candidate for TAVR considering her advanced age.  She should have annual echo studies performed for surveillance of her valvular heart disease.            Medication Adjustments/Labs and Tests Ordered: Current medicines are reviewed at length with the patient today.  Concerns regarding medicines are outlined above.  Orders Placed This Encounter  Procedures   EKG 12-Lead   No orders of the defined types were placed in this encounter.   Patient Instructions  Medication Instructions:  Your physician recommends that you continue on your current medications as directed. Please refer to the Current Medication list given to you today.  *If you need a refill on your cardiac medications before your next appointment, please call your pharmacy*   Lab Work: NONE If you have labs (blood work) drawn today and your tests are completely normal, you will receive your results only by: [place] (if you have MyChart) OR A paper copy in the mail If you have any lab test that is abnormal or we need to change your treatment, we will call you to review the results.   Testing/Procedures: NONE   Follow-Up: At [facility], you and your health needs are our priority.  As part of our continuing mission to provide you with exceptional heart care, we have created designated Provider Care Teams.  These Care Teams include your primary Cardiologist (physician) and Advanced Practice Providers (APPs -  Physician Assistants and Nurse Practitioners) who all work together to provide you with the care you need, when you need it.  Your next appointment:   6 month(s)  The format for your next appointment:   In Person  Provider:   [name], MD       Important Information About  [place]         Signed, Sherren Mocha, MD  02/18/2022 1:14 PM    [place]

## 2022-02-22 ENCOUNTER — Telehealth: Payer: Self-pay

## 2022-02-22 DIAGNOSIS — I350 Nonrheumatic aortic (valve) stenosis: Secondary | ICD-10-CM

## 2022-02-22 NOTE — Telephone Encounter (Signed)
Called and spoke with patient who understands to get ECHO and f/u appt with Dr Burt Knack in 1 year, but will still see Dr Marlou Porch in 6 months as her primary cardiologist.  Order placed at this time for repeat ECHO.

## 2022-02-22 NOTE — Telephone Encounter (Signed)
-----   Message from Sherren Mocha, MD sent at 02/22/2022  5:09 AM EDT ----- Judy Walker can you have her follow-up with me in one year after her echo is completed to follow the AS? She is going to see Dr Marlou Porch in 6 months and we can alternate visits. thx

## 2022-05-20 ENCOUNTER — Other Ambulatory Visit: Payer: Self-pay | Admitting: Family Medicine

## 2022-05-20 ENCOUNTER — Ambulatory Visit (INDEPENDENT_AMBULATORY_CARE_PROVIDER_SITE_OTHER): Payer: Medicare Other | Admitting: Family Medicine

## 2022-05-20 ENCOUNTER — Encounter: Payer: Self-pay | Admitting: Family Medicine

## 2022-05-20 ENCOUNTER — Telehealth: Payer: Self-pay

## 2022-05-20 VITALS — BP 158/94 | HR 94 | Temp 97.2°F | Ht 66.0 in | Wt 160.4 lb

## 2022-05-20 DIAGNOSIS — I10 Essential (primary) hypertension: Secondary | ICD-10-CM

## 2022-05-20 DIAGNOSIS — Z0000 Encounter for general adult medical examination without abnormal findings: Secondary | ICD-10-CM | POA: Diagnosis not present

## 2022-05-20 DIAGNOSIS — Z8673 Personal history of transient ischemic attack (TIA), and cerebral infarction without residual deficits: Secondary | ICD-10-CM

## 2022-05-20 DIAGNOSIS — E559 Vitamin D deficiency, unspecified: Secondary | ICD-10-CM | POA: Diagnosis not present

## 2022-05-20 DIAGNOSIS — E7800 Pure hypercholesterolemia, unspecified: Secondary | ICD-10-CM | POA: Diagnosis not present

## 2022-05-20 DIAGNOSIS — N183 Chronic kidney disease, stage 3 unspecified: Secondary | ICD-10-CM | POA: Diagnosis not present

## 2022-05-20 DIAGNOSIS — M85859 Other specified disorders of bone density and structure, unspecified thigh: Secondary | ICD-10-CM

## 2022-05-20 DIAGNOSIS — I4821 Permanent atrial fibrillation: Secondary | ICD-10-CM

## 2022-05-20 DIAGNOSIS — E039 Hypothyroidism, unspecified: Secondary | ICD-10-CM

## 2022-05-20 DIAGNOSIS — I38 Endocarditis, valve unspecified: Secondary | ICD-10-CM

## 2022-05-20 DIAGNOSIS — K259 Gastric ulcer, unspecified as acute or chronic, without hemorrhage or perforation: Secondary | ICD-10-CM

## 2022-05-20 DIAGNOSIS — Z1231 Encounter for screening mammogram for malignant neoplasm of breast: Secondary | ICD-10-CM

## 2022-05-20 LAB — LIPID PANEL
Cholesterol: 186 mg/dL (ref 0–200)
HDL: 62.9 mg/dL (ref >39.00)
LDL Cholesterol: 91 mg/dL (ref 0–99)
NonHDL: 123.22
Total CHOL/HDL Ratio: 3
Triglycerides: 161 mg/dL — ABNORMAL HIGH (ref 0.0–149.0)
VLDL: 32.2 mg/dL (ref 0.0–40.0)

## 2022-05-20 LAB — COMPREHENSIVE METABOLIC PANEL
ALT: 13 U/L (ref 0–35)
AST: 16 U/L (ref 0–37)
Albumin: 4.7 g/dL (ref 3.5–5.2)
Alkaline Phosphatase: 62 U/L (ref 39–117)
BUN: 23 mg/dL (ref 6–23)
CO2: 31 mEq/L (ref 19–32)
Calcium: 9.7 mg/dL (ref 8.4–10.5)
Chloride: 101 mEq/L (ref 96–112)
Creatinine, Ser: 0.98 mg/dL (ref 0.40–1.20)
GFR: 51.91 mL/min — ABNORMAL LOW (ref >60.00)
Glucose, Bld: 101 mg/dL — ABNORMAL HIGH (ref 70–99)
Potassium: 4.2 mEq/L (ref 3.5–5.1)
Sodium: 141 mEq/L (ref 135–145)
Total Bilirubin: 0.8 mg/dL (ref 0.2–1.2)
Total Protein: 7.2 g/dL (ref 6.0–8.3)

## 2022-05-20 LAB — CBC WITH DIFFERENTIAL/PLATELET
Basophils Absolute: 0 10*3/uL (ref 0.0–0.1)
Basophils Relative: 0.9 % (ref 0.0–3.0)
Eosinophils Absolute: 0.2 10*3/uL (ref 0.0–0.7)
Eosinophils Relative: 3.8 % (ref 0.0–5.0)
HCT: 40.1 % (ref 36.0–46.0)
Hemoglobin: 13.9 g/dL (ref 12.0–15.0)
Lymphocytes Relative: 23.5 % (ref 12.0–46.0)
Lymphs Abs: 1.2 10*3/uL (ref 0.7–4.0)
MCHC: 34.8 g/dL (ref 30.0–36.0)
MCV: 101 fl — ABNORMAL HIGH (ref 78.0–100.0)
Monocytes Absolute: 0.5 10*3/uL (ref 0.1–1.0)
Monocytes Relative: 9.6 % (ref 3.0–12.0)
Neutro Abs: 3.2 10*3/uL (ref 1.4–7.7)
Neutrophils Relative %: 62.2 % (ref 43.0–77.0)
Platelets: 239 10*3/uL (ref 150.0–400.0)
RBC: 3.97 Mil/uL (ref 3.87–5.11)
RDW: 12.5 % (ref 11.5–15.5)
WBC: 5.2 10*3/uL (ref 4.0–10.5)

## 2022-05-20 LAB — MICROALBUMIN / CREATININE URINE RATIO
Creatinine,U: 71 mg/dL
Microalb Creat Ratio: 1 mg/g (ref 0.0–30.0)
Microalb, Ur: <0.7 mg/dL (ref 0.0–1.9)

## 2022-05-20 LAB — VITAMIN D 25 HYDROXY (VIT D DEFICIENCY, FRACTURES): VITD: 113.04 ng/mL (ref 30.00–100.00)

## 2022-05-20 LAB — TSH: TSH: 3.73 u[IU]/mL (ref 0.35–5.50)

## 2022-05-20 MED ORDER — LOSARTAN POTASSIUM 100 MG PO TABS: 100.0000 mg | ORAL_TABLET | Freq: Every day | ORAL | 4 refills | Status: DC

## 2022-05-20 MED ORDER — PANTOPRAZOLE SODIUM 40 MG PO TBEC: 40.0000 mg | DELAYED_RELEASE_TABLET | Freq: Every day | ORAL | 4 refills | Status: DC

## 2022-05-20 MED ORDER — VALSARTAN 320 MG PO TABS: 320.0000 mg | ORAL_TABLET | Freq: Every day | ORAL | 4 refills | Status: DC

## 2022-05-20 MED ORDER — LEVOTHYROXINE SODIUM 75 MCG PO TABS: 75.0000 ug | ORAL_TABLET | Freq: Every day | ORAL | 4 refills | Status: DC

## 2022-05-20 MED ORDER — ATORVASTATIN CALCIUM 20 MG PO TABS: 20.0000 mg | ORAL_TABLET | Freq: Every day | ORAL | 4 refills | Status: DC

## 2022-05-20 MED ORDER — HYDROCHLOROTHIAZIDE 25 MG PO TABS: 25.0000 mg | ORAL_TABLET | Freq: Every day | ORAL | 4 refills | Status: DC

## 2022-05-20 NOTE — Assessment & Plan Note (Addendum)
H/o this, sees GI on daily PPI in setting of eliquis use

## 2022-05-20 NOTE — Assessment & Plan Note (Addendum)
Continue eliquis and statin.

## 2022-05-20 NOTE — Patient Instructions (Addendum)
Labs today Change losartan to valsartan. Keep an eye on blood pressures at home. Return in 3 months for BP follow up visit. Schedule dentist eval at your convenience.  Call to schedule mammogram at your convenience: [place] [facility] [phone].  Schedule bone density scan on same day you schedule mammogram.  Good to see you today Return as needed or in 1 year for next physical.  Return in 3 months for HTN f/u visit   Health Maintenance After Age 86 After age 16, you are at a higher risk for certain long-term diseases and infections as well as injuries from falls. Falls are a major cause of broken bones and head injuries in people who are older than age 75. Getting regular preventive care can help to keep you healthy and well. Preventive care includes getting regular testing and making lifestyle changes as recommended by your health care provider. Talk with your health care provider about: Which screenings and tests you should have. A screening is a test that checks for a disease when you have no symptoms. A diet and exercise plan that is right for you. What should I know about screenings and tests to prevent falls? Screening and testing are the best ways to find a health problem early. Early diagnosis and treatment give you the best chance of managing medical conditions that are common after age 11. Certain conditions and lifestyle choices may make you more likely to have a fall. Your health care provider may recommend: Regular vision checks. Poor vision and conditions such as cataracts can make you more likely to have a fall. If you wear glasses, make sure to get your prescription updated if your vision changes. Medicine review. Work with your health care provider to regularly review all of the medicines you are taking, including over-the-counter medicines. Ask your health care provider about any side effects that may make you more likely to have a fall. Tell your health care provider  if any medicines that you take make you feel dizzy or sleepy. Strength and balance checks. Your health care provider may recommend certain tests to check your strength and balance while standing, walking, or changing positions. Foot health exam. Foot pain and numbness, as well as not wearing proper footwear, can make you more likely to have a fall. Screenings, including: Osteoporosis screening. Osteoporosis is a condition that causes the bones to get weaker and break more easily. Blood pressure screening. Blood pressure changes and medicines to control blood pressure can make you feel dizzy. Depression screening. You may be more likely to have a fall if you have a fear of falling, feel depressed, or feel unable to do activities that you used to do. Alcohol use screening. Using too much alcohol can affect your balance and may make you more likely to have a fall. Follow these instructions at home: Lifestyle Do not drink alcohol if: Your health care provider tells you not to drink. If you drink alcohol: Limit how much you have to: 0-1 drink a day for women. 0-2 drinks a day for men. Know how much alcohol is in your drink. In the [place] one drink equals one 12 oz bottle of beer (355 mL), one 5 oz glass of wine (148 mL), or one 1 oz glass of hard liquor (44 mL). Do not use any products that contain nicotine or tobacco. These products include cigarettes, chewing tobacco, and vaping devices, such as e-cigarettes. If you need help quitting, ask your health care provider. Activity  Follow a regular exercise program to stay fit. This will help you maintain your balance. Ask your health care provider what types of exercise are appropriate for you. If you need a cane or walker, use it as recommended by your health care provider. Wear supportive shoes that have nonskid soles. Safety  Remove any tripping hazards, such as rugs, cords, and clutter. Install safety equipment such as grab bars in bathrooms  and safety rails on stairs. Keep rooms and walkways well-lit. General instructions Talk with your health care provider about your risks for falling. Tell your health care provider if: You fall. Be sure to tell your health care provider about all falls, even ones that seem minor. You feel dizzy, tiredness (fatigue), or off-balance. Take over-the-counter and prescription medicines only as told by your health care provider. These include supplements. Eat a healthy diet and maintain a healthy weight. A healthy diet includes low-fat dairy products, low-fat (lean) meats, and fiber from whole grains, beans, and lots of fruits and vegetables. Stay current with your vaccines. Schedule regular health, dental, and eye exams. Summary Having a healthy lifestyle and getting preventive care can help to protect your health and wellness after age 62. Screening and testing are the best way to find a health problem early and help you avoid having a fall. Early diagnosis and treatment give you the best chance for managing medical conditions that are more common for people who are older than age 83. Falls are a major cause of broken bones and head injuries in people who are older than age 50. Take precautions to prevent a fall at home. Work with your health care provider to learn what changes you can make to improve your health and wellness and to prevent falls. This information is not intended to replace advice given to you by your health care provider. Make sure you discuss any questions you have with your health care provider. Document Revised: 10/12/2020 Document Reviewed: 10/12/2020 Elsevier Patient Education  [place].

## 2022-05-20 NOTE — Telephone Encounter (Signed)
Spoke with pt relaying Dr. G's message. Pt verbalizes understanding.  

## 2022-05-20 NOTE — Assessment & Plan Note (Addendum)
Update labs today 

## 2022-05-20 NOTE — Assessment & Plan Note (Signed)
Continue eliquis. Sounds regular today.

## 2022-05-20 NOTE — Assessment & Plan Note (Signed)
Preventative protocols reviewed and updated unless pt declined. Discussed healthy diet and lifestyle.  

## 2022-05-20 NOTE — Assessment & Plan Note (Signed)
Update levels on 1000 IU daily.

## 2022-05-20 NOTE — Assessment & Plan Note (Signed)
Update TSH on levothyroxine 78mg daily

## 2022-05-20 NOTE — Assessment & Plan Note (Signed)
Chronic, BP elevated today and at last office visit, as well as at home. Will intensify ARB  potency from losartan to valsartan. RTC 3 mo HTN f/u visit.

## 2022-05-20 NOTE — Assessment & Plan Note (Signed)
Update FLP on atorvastatin '20mg'$  daily. The ASCVD Risk score (Arnett DK, et al., 2019) failed to calculate for the following reasons:   The 2019 ASCVD risk score is only valid for ages 38 to 82

## 2022-05-20 NOTE — Telephone Encounter (Signed)
Noted. Plz have pt stop extra vitamin D 1000 IU daily at this time.

## 2022-05-20 NOTE — Assessment & Plan Note (Signed)
Will order updated DEXA to get done with mammogram next year.

## 2022-05-20 NOTE — Assessment & Plan Note (Addendum)
Appreciate cardiology care. Known mod-severe M, mild-mod TR, mild AS. She does endorse ongoing mild exertional dyspnea/dizziness.

## 2022-05-20 NOTE — Progress Notes (Signed)
Patient ID: Judy Walker, female    DOB: March 13, 1933, 88 y.o.   MRN: [id number]  This visit was conducted in person.  BP (!) 158/94   Pulse 94   Temp (!) 97.2 F (36.2 C) (Temporal)   Ht '5\' 6"'$  (1.676 m)   Wt 160 lb 6.4 oz (72.8 kg)   SpO2 98%   BMI 25.89 kg/m   BP 160/100 on recheck BP Readings from Last 3 Encounters:  05/20/22 (!) 158/94  02/18/22 (!) 152/92  09/21/21 132/82    CC: CPE/AMW Subjective:   HPI: Judy Walker is a 88 y.o. female presenting on 05/20/2022 for Medicare Wellness   Did not see health advisor this year.   Hearing Screening   '500Hz'$  '1000Hz'$  '2000Hz'$  '4000Hz'$   Right ear 0 0 40 0  Left ear 0 40 0 40   Vision Screening   Right eye Left eye Both eyes  Without correction '20/25 20/50 20/30 '$  With correction     Notes hearing loss but declines audiology eval.  Regularly sees eye doctor.  [facility] Visit from 05/20/2022 in [place] [facility] [place]  PHQ-2 Total Score 0          05/20/2022   10:08 AM 05/14/2021   10:33 AM 05/12/2020   10:43 AM 11/18/2019   10:32 AM 03/22/2019   10:54 AM  Fall Risk   Falls in the past year? 0 0 0 1 0  Number falls in past yr:  0 0 0   Injury with Fall?  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 Eliquis was stopped (afib s/p cardioversion 06/2019 with recurrence), PPI started. S/p EGD showing 4cm HH, healed ulcers, likely scarring as mucosal change. Eliquis restarted 01/2020. She last saw GI 08/2021, planned Q78yrcheck, continues pantoprazole '40mg'$  daily.    Known moderately severe myxomatous MR and moderate TR along with aortic stenosis.  Monitoring with medical therapy and serial echos. Seeing cardiology regularly. sees Dr CBurt Knackand SMarlou Porch   Walking less due to increasing exertional dizziness and dyspnea. No smoking history.    Preventative: Colon cancer screening - has declined colonoscopy. iFOB positive 2015. We referred to Dr GCarlean Purlper pt preference 2015, pt did not go. No h/o hemorrhoids. No fmhx colon cancer. Rpt iFOB returned negative (2016). Requested to stop screening. Denies blood in stool or bowel changes since.  Well woman - benign hysterectomy 2003, ovarian cysts. Aged out.  [facility] - [facility] Plans Q1-284yr Does breast exams at home. Sisters x2 with breast cancer.  DEXA - T score -1.9 (07/2012), T -2.3 femur (09/2015). Has been on fosamax since ~2014, stopped 2019.  DEXA 03/2019 - T -2.0 hip.  Doesn't like dairy products. Takes calc supplement and vit D. Discussed weight bearing exercise.  Lung cancer screening - not eligible  Flu shot yearly COVID vaccine Pfizer 07/2019, 08/2019, booster 03/2020, bivalent booster 02/2021, booster 04/2022 Pneumovax 2006, [id number].  Td 2012.  zostavax - 09/2013.  shingrix - discussed, declines.  Advanced directives - scanned and in chart 09/2017. HCPOA are son JoJenny Reichmannnd daughter BeRise PaganiniDoes not desire life prolonging measures if terminal  condition Seatbelt use discussed. Sunscreen use discussed.  No suspicious moles.  Sleep - averaging 7 hours/night  Non smoker  Alcohol - rarely  Dentist - does not see - encouraged schedule eye exam  Eye doctor - Q28yr, last seen 2022 Bowel - no constipation  Bladder - no incontinence   Doesn't drive - never has. Lives alone. Widow 2012. [place]2 year at [place] [employer]aughter lives nearby. Son also helps.  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  Diet: daily fruits/vegetables, some water.       Relevant past medical, surgical, family and  social history reviewed and updated as indicated. Interim medical history since our last visit reviewed. Allergies and medications reviewed and updated. Outpatient Medications Prior to Visit  Medication Sig Dispense Refill   Ascorbic Acid (VITAMIN C GUMMIE) 120 MG CHEW Chew 250 mg by mouth daily.     Calcium Carbonate (CALCIUM 500 PO) Take 500 mg by mouth daily.     Cholecalciferol (VITAMIN D3) 25 MCG (1000 UT) CAPS Take 1 capsule (1,000 Units total) by mouth daily. 30 capsule    diltiazem (CARDIZEM CD) 240 MG 24 hr capsule TAKE 1 CAPSULE BY MOUTH EVERY DAY 90 capsule 3   diphenhydrAMINE (BENADRYL) 25 mg capsule Take 25 mg by mouth at bedtime.     ELIQUIS 5 MG TABS tablet TAKE 1 TABLET BY MOUTH TWICE A DAY 60 tablet 5   Multiple Vitamins-Minerals (MULTIVITAMIN ADULT) TABS Take 1 tablet by mouth daily.     potassium gluconate 595 (99 K) MG TABS tablet Take 595 mg by mouth daily.     atorvastatin (LIPITOR) 20 MG tablet Take 1 tablet (20 mg total) by mouth daily. 90 tablet 3   hydrochlorothiazide (HYDRODIURIL) 25 MG tablet Take 1 tablet (25 mg total) by mouth daily. 90 tablet 3   levothyroxine (SYNTHROID) 75 MCG tablet Take 1 tablet (75 mcg total) by mouth daily. 90 tablet 3   losartan (COZAAR) 100 MG tablet TAKE 1 TABLET BY MOUTH EVERY DAY 90 tablet 1   pantoprazole (PROTONIX) 40 MG tablet Take 1 tablet (40 mg total) by mouth daily. 90 tablet 3   No facility-administered medications prior to visit.     Per HPI unless specifically indicated in ROS section below Review of Systems  Constitutional:  Negative for activity change, appetite change, chills, fatigue, fever and unexpected weight change.  HENT:  Negative for hearing loss.   Eyes:  Negative for visual disturbance.  Respiratory:  Positive for shortness of breath. Negative for cough, chest tightness and wheezing.   Cardiovascular:  Negative for chest pain, palpitations and leg swelling.  Gastrointestinal:  Negative for abdominal  distention, abdominal pain, blood in stool, constipation, diarrhea, nausea and vomiting.  Genitourinary:  Negative for difficulty urinating and hematuria.  Musculoskeletal:  Negative for arthralgias, myalgias and neck pain.  Skin:  Negative for rash.  Neurological:  Positive for dizziness. Negative for seizures, syncope and headaches.  Hematological:  Negative for adenopathy. Does not bruise/bleed easily.  Psychiatric/Behavioral:  Negative for dysphoric mood. The patient is not nervous/anxious.     Objective:  BP (!) 158/94   Pulse 94   Temp (!) 97.2 F (36.2 C) (Temporal)   Ht '5\' 6"'$  (1.676 m)   Wt 160 lb 6.4 oz (72.8 kg)   SpO2 98%   BMI 25.89 kg/m   Wt Readings from Last 3 Encounters:  05/20/22 160 lb 6.4 oz (72.8 kg)  02/18/22  159 lb 9.6 oz (72.4 kg)  09/21/21 162 lb 3.2 oz (73.6 kg)      Physical Exam Vitals and nursing note reviewed.  Constitutional:      Appearance: Normal appearance. She is not ill-appearing.  HENT:     Head: Normocephalic and atraumatic.     Right Ear: Tympanic membrane, ear canal and external ear normal. There is no impacted cerumen.     Left Ear: Tympanic membrane, ear canal and external ear normal. There is no impacted cerumen.     Mouth/Throat:     Comments: Wearing mask Eyes:     General:        Right eye: No discharge.        Left eye: No discharge.     Extraocular Movements: Extraocular movements intact.     Conjunctiva/sclera: Conjunctivae normal.     Pupils: Pupils are equal, round, and reactive to light.  Neck:     Thyroid: No thyroid mass or thyromegaly.     Vascular: Carotid bruit (radiation of cardiac murmur) present.  Cardiovascular:     Rate and Rhythm: Normal rate and regular rhythm.     Pulses: Normal pulses.     Heart sounds: Murmur (3/6 systolic USB) heard.  Pulmonary:     Effort: Pulmonary effort is normal. No respiratory distress.     Breath sounds: Normal breath sounds. No wheezing, rhonchi or rales.  Abdominal:      General: Bowel sounds are normal. There is no distension.     Palpations: Abdomen is soft. There is no mass.     Tenderness: There is no abdominal tenderness. There is no guarding or rebound.     Hernia: No hernia is present.  Musculoskeletal:     Cervical back: Normal range of motion and neck supple. No rigidity.     Right lower leg: No edema.     Left lower leg: No edema.  Lymphadenopathy:     Cervical: No cervical adenopathy.  Skin:    General: Skin is warm and dry.     Findings: No rash.  Neurological:     General: No focal deficit present.     Mental Status: She is alert. Mental status is at baseline.     Comments:  Recall 3/3 Calculation 5/5 serial 3s  Psychiatric:        Mood and Affect: Mood normal.        Behavior: Behavior normal.       Lab Results  Component Value Date   CREATININE 1.09 05/18/2021   BUN 23 05/18/2021   NA 139 05/18/2021   K 3.9 05/18/2021   CL 98 05/18/2021   CO2 33 (H) 05/18/2021    Assessment & Plan:   Problem List Items Addressed This Visit     Essential hypertension (Chronic)    Chronic, BP elevated today and at last office visit, as well as at home. Will intensify ARB  potency from losartan to valsartan. RTC 3 mo HTN f/u visit.       Relevant Medications   atorvastatin (LIPITOR) 20 MG tablet   hydrochlorothiazide (HYDRODIURIL) 25 MG tablet   valsartan (DIOVAN) 320 MG tablet   Medicare annual wellness visit, subsequent - Primary (Chronic)    I have personally reviewed the Medicare Annual Wellness questionnaire and have noted 1. The patient's medical and social history 2. Their use of alcohol, tobacco or illicit drugs 3. Their current medications and supplements 4. The patient's functional ability including ADL's, fall risks, home  safety risks and hearing or visual impairment. Cognitive function has been assessed and addressed as indicated.  5. Diet and physical activity 6. Evidence for depression or mood disorders The patients  weight, height, BMI have been recorded in the chart. I have made referrals, counseling and provided education to the patient based on review of the above and I have provided the pt with a written personalized care plan for preventive services. Provider list updated.. See scanned questionairre as needed for further documentation. Reviewed preventative protocols and updated unless pt declined.       Valvular heart disease (Chronic)    Appreciate cardiology care. Known mod-severe M, mild-mod TR, mild AS. She does endorse ongoing mild exertional dyspnea/dizziness.       Relevant Medications   atorvastatin (LIPITOR) 20 MG tablet   hydrochlorothiazide (HYDRODIURIL) 25 MG tablet   valsartan (DIOVAN) 320 MG tablet   Health maintenance examination (Chronic)    Preventative protocols reviewed and updated unless pt declined. Discussed healthy diet and lifestyle.       History of ischemic stroke (Chronic)    Continue eliquis and statin.      Permanent atrial fibrillation (HCC) (Chronic)    Continue eliquis. Sounds regular today.       Relevant Medications   atorvastatin (LIPITOR) 20 MG tablet   hydrochlorothiazide (HYDRODIURIL) 25 MG tablet   valsartan (DIOVAN) 320 MG tablet   Pure hypercholesterolemia (Chronic)    Update FLP on atorvastatin '20mg'$  daily. The ASCVD Risk score (Arnett DK, et al., 2019) failed to calculate for the following reasons:   The 2019 ASCVD risk score is only valid for ages 59 to 18       Relevant Medications   atorvastatin (LIPITOR) 20 MG tablet   hydrochlorothiazide (HYDRODIURIL) 25 MG tablet   valsartan (DIOVAN) 320 MG tablet   Other Relevant Orders   Lipid panel   Comprehensive metabolic panel   Hypothyroidism    Update TSH on levothyroxine 74mg daily      Relevant Medications   levothyroxine (SYNTHROID) 75 MCG tablet   Other Relevant Orders   TSH   Osteopenia    Will order updated DEXA to get done with mammogram next year.       Relevant Orders    DG Bone Density   VITAMIN D 25 Hydroxy (Vit-D Deficiency, Fractures)   Vitamin D deficiency    Update levels on 1000 IU daily.       Relevant Orders   VITAMIN D 25 Hydroxy (Vit-D Deficiency, Fractures)   Gastric ulcer    H/o this, sees GI on daily PPI in setting of eliquis use      CKD (chronic kidney disease) stage 3, GFR 30-59 ml/min (HCC)    Update labs today.       Relevant Orders   Comprehensive metabolic panel   CBC with Differential/Platelet   Parathyroid hormone, intact (no Ca)   Microalbumin / creatinine urine ratio     Meds ordered this encounter  Medications   atorvastatin (LIPITOR) 20 MG tablet    Sig: Take 1 tablet (20 mg total) by mouth daily.    Dispense:  90 tablet    Refill:  4   hydrochlorothiazide (HYDRODIURIL) 25 MG tablet    Sig: Take 1 tablet (25 mg total) by mouth daily.    Dispense:  90 tablet    Refill:  4   levothyroxine (SYNTHROID) 75 MCG tablet    Sig: Take 1 tablet (75 mcg total) by mouth daily.  Dispense:  90 tablet    Refill:  4   pantoprazole (PROTONIX) 40 MG tablet    Sig: Take 1 tablet (40 mg total) by mouth daily.    Dispense:  90 tablet    Refill:  4   DISCONTD: losartan (COZAAR) 100 MG tablet    Sig: Take 1 tablet (100 mg total) by mouth daily.    Dispense:  90 tablet    Refill:  4   valsartan (DIOVAN) 320 MG tablet    Sig: Take 1 tablet (320 mg total) by mouth daily.    Dispense:  90 tablet    Refill:  4    To replace losartan '100mg'$    Orders Placed This Encounter  Procedures   DG Bone Density    Standing Status:   Future    Standing Expiration Date:   05/21/2023    Order Specific Question:   Reason for Exam (SYMPTOM  OR DIAGNOSIS REQUIRED)    Answer:   osteoporosis f/u    Order Specific Question:   Preferred imaging location?    [facility]   VITAMIN D 25 Hydroxy (Vit-D Deficiency, Fractures)   Lipid panel   Comprehensive metabolic panel   TSH   CBC with Differential/Platelet   Parathyroid  hormone, intact (no Ca)   Microalbumin / creatinine urine ratio    Patient instructions: Labs today Change losartan to valsartan. Keep an eye on blood pressures at home. Return in 3 months for BP follow up visit. Schedule dentist eval at your convenience.  Call to schedule mammogram at your convenience: [facility] [phone].  Schedule bone density scan on same day you schedule mammogram.  Good to see you today Return as needed or in 1 year for next physical.   Follow up plan: Return in about 1 year (around 05/21/2023) for annual exam, prior fasting for blood work, medicare wellness visit.  Ria Bush, MD

## 2022-05-20 NOTE — Assessment & Plan Note (Signed)

## 2022-05-20 NOTE — Telephone Encounter (Signed)
CRITICAL VALUE STICKER  CRITICAL VALUE: Vtd D 113  RECEIVER (on-site recipient of call): Harrison Paulson   DATE & TIME NOTIFIED:  05/20/2022  at 2:40  MESSENGER (representative from lab): Hope   MD NOTIFIED:  Dr. Danise Mina   TIME OF NOTIFICATION: 05/20/2022 at 2:41  RESPONSE: Information documented in red binder and placed for provider to see. CMA informed as well. Message will be sent to Dr. Darnell Level as High Priority

## 2022-05-21 LAB — PARATHYROID HORMONE, INTACT (NO CA): PTH: 32 pg/mL (ref 16–77)

## 2022-05-24 ENCOUNTER — Telehealth: Payer: Self-pay | Admitting: Family Medicine

## 2022-05-24 NOTE — Telephone Encounter (Signed)
Patient would like to know if its necessary for her to come in for her appointment on 08/19/22 for her bp f/u,because she goes to  her heart doctor on that same day and she knows that he is going to check her b/p as well. She doesn't want to have 2 appointments if they aren't needed.

## 2022-05-24 NOTE — Telephone Encounter (Signed)
Since she's seeing cardiology on same date, ok to cancel appt here.

## 2022-05-25 ENCOUNTER — Other Ambulatory Visit: Payer: Self-pay | Admitting: Cardiology

## 2022-05-25 DIAGNOSIS — I4821 Permanent atrial fibrillation: Secondary | ICD-10-CM

## 2022-05-25 NOTE — Telephone Encounter (Signed)
Eliquis '5mg'$  refill request received. Patient is 88 years old, weight-72.8kg, Crea-0.98 on 05/20/2022, Diagnosis-Afib, and last seen by Dr. Burt Knack on 02/18/2022. Dose is appropriate based on dosing criteria. Will send in refill to requested pharmacy.

## 2022-05-25 NOTE — Telephone Encounter (Signed)
Spoke with pt relaying Dr. Synthia Innocent message. Pt expresses her thanks.   C/x OV.

## 2022-06-06 ENCOUNTER — Other Ambulatory Visit: Payer: Self-pay | Admitting: Family Medicine

## 2022-06-10 NOTE — Telephone Encounter (Signed)
Too soon. Rx sent on 05/20/22, #90/4 to [facility].

## 2022-08-19 ENCOUNTER — Ambulatory Visit: Payer: Medicare Other | Admitting: Family Medicine

## 2022-08-19 ENCOUNTER — Ambulatory Visit: Payer: Medicare Other | Admitting: Cardiology

## 2022-09-15 ENCOUNTER — Ambulatory Visit: Payer: Medicare Other | Admitting: Physician Assistant

## 2022-10-09 NOTE — Progress Notes (Unsigned)
Office Visit    Patient Name: Judy Walker Date of Encounter: 10/10/2022  PCP:  [name], MD    [facility]  Cardiologist:  [name], MD  Advanced Practice Provider:  No care team member to display Electrophysiologist:  None   HPI    Judy Walker is a 89 y.o. female presents today for follow-up visit.   Profile includes:    Permanent atrial fibrillation  S/p DCCV in Jan 2021 >> recurrent A-fib Aortic stenosis Echo 9/22: Mean gradient 12.4 mmHg, V-max 236 cm/s, DI 0.34 Mitral regurgitation Echo 9/22: Moderate to severe MR Eval w structural heart (Dr. [name]) 02/2021 >> med Rx w f/u 1 year Hx CVA Carotid artery disease [place] 7/22: bilat ICA 1-39 Hypertension  Hyperlipidemia  hypothyroidism Hx of GI bleed BPPV  Patient was last seen by Dr. [name] 02/18/2022 and at that time she was doing very well.  Denied any changes in symptoms over the last year.  Remained remarkably active.  Heading to the beach with her son that weekend.  Mild dyspnea with exertion and sometimes with certain housework such as vacuuming.  No chest pain/pressure.  Denied swelling, heart palpitations, orthopnea, PND.  Today, she states she is very active. She is headed to [employer] in the morning with her son who is retired and enjoys fishing. No chest pains or SOB. Sometimes she has dizzy spells when she is gardening and she knows to sit down. It makes her nervous but only lasts a few seconds and then goes away. This has been stable. We reviewed her echo again. She has two living siblings who are older than she is 90 and 13. One brother and one sister. She enjoys spending time with family.   Reports no shortness of breath nor dyspnea on exertion. Reports no chest pain, pressure, or tightness. No edema, orthopnea, PND. Reports no palpitations.    Past Medical History    Past Medical History:  Diagnosis Date   Anemia    Atrial fibrillation (HCC)    Benign  positional vertigo 2005   Colonoscopy refused    " I elected not to have one" SOC reviewed 07/06/10; she continues to decline colonscopy   Heart murmur    HLD (hyperlipidemia)    total cholesterol 340 in 2003, NHDL 61, TG 75. NMR Lipoprofile 2005: LDL 84 (933/285), LDL glad =<120 ideally <90. Framingham study LDL goal <130   HTN (hypertension)    Hypothyroidism    Osteopenia 08/2012, 09/2015   DEXA -1.9 (2014) -2.3 (2017)   Past Surgical History:  Procedure Laterality Date   ABDOMINAL HYSTERECTOMY  2003   & BSO for cystic ovaries . No PMH of abnormal PAP   CARDIAC CATHETERIZATION     normal   CARDIOVERSION N/A 06/27/2019   Procedure: CARDIOVERSION;  Surgeon: [name], MD;  Location: [facility] ENDOSCOPY;  Service: Cardiovascular;  Laterality: N/A;   CATARACT EXTRACTION, BILATERAL  09/2011   Dr.[name]    CYSTOSCOPY     bladder @ age 3 for painful hematuria   ESOPHAGOGASTRODUODENOSCOPY  12/2019   prepyloric scarring, 4cm HH, biopsies with healing ulcer, neg H pylori [name])   ESOPHAGOGASTRODUODENOSCOPY  11/2019   UGI bleed - mass present @ [facility]    Allergies  No Known Allergies  EKGs/Labs/Other Studies Reviewed:   The following studies were reviewed today: Cardiac Studies & Procedures       ECHOCARDIOGRAM  ECHOCARDIOGRAM COMPLETE 02/18/2022  Narrative ECHOCARDIOGRAM REPORT  Patient Name:   Judy Walker [facility] Date of Exam: 02/18/2022 Medical Rec #:  [id number]         Height:       60.5 in Accession #:    [id number]        Weight:       162.2 lb Date of Birth:  July 28, 1932         BSA:          1.718 m Patient Age:    89 years          BP:           132/82 mmHg Patient Gender: F                 HR:           86 bpm. Exam Location:  [address]  Procedure: 2D Echo, Cardiac Doppler and Color Doppler  Indications:    I34.0 MR  History:        Patient has prior history of Echocardiogram examinations, most recent 02/10/2021. MR, Arrythmias:Atrial  Fibrillation, Signs/Symptoms:AS; Risk Factors:Hypertension.  Sonographer:    Samule Ohm RDCS Referring Phys: [phone] MICHAEL COOPER  IMPRESSIONS   1. Left ventricular ejection fraction, by estimation, is 65 to 70%. The left ventricle has normal function. The left ventricle has no regional wall motion abnormalities. There is mild concentric left ventricular hypertrophy. Left ventricular diastolic function could not be evaluated. 2. Right ventricular systolic function is normal. The right ventricular size is normal. There is normal pulmonary artery systolic pressure. The estimated right ventricular systolic pressure is 31.9 mmHg. 3. Left atrial size was moderately dilated. 4. Right atrial size was mildly dilated. 5. The mitral valve is myxomatous. Moderate to severe mitral valve regurgitation. No evidence of mitral stenosis. Moderate mitral annular calcification. 6. Tricuspid valve regurgitation is mild to moderate. 7. Possible paradoxical low-flow low-gradient severe aortic stenosis with low stroke volume. Questionable validity of dimensionless index and AV area by continuity equation (both suggest severe AS) due to irregular rhythm and possible erroneous placement of the pulsed Doppler sample volume in the LV outflow tract. The AS jet remains early peaking (acceleration time <100 ms). The aortic valve is tricuspid. There is severe calcifcation of the aortic valve. There is severe thickening of the aortic valve. Aortic valve regurgitation is not visualized. Severe aortic valve stenosis. 8. The inferior vena cava is normal in size with greater than 50% respiratory variability, suggesting right atrial pressure of 3 mmHg.  Comparison(s): Consider TEE to clarify the severity of the aortic stenosis and mitral insufficiency, if this will have clinical implications (symptomatic patient that is a candidate for valve repair).  FINDINGS Left Ventricle: Left ventricular ejection fraction, by  estimation, is 65 to 70%. The left ventricle has normal function. The left ventricle has no regional wall motion abnormalities. The left ventricular internal cavity size was normal in size. There is mild concentric left ventricular hypertrophy. Left ventricular diastolic function could not be evaluated due to atrial fibrillation. Left ventricular diastolic function could not be evaluated.  Right Ventricle: The right ventricular size is normal. No increase in right ventricular wall thickness. Right ventricular systolic function is normal. There is normal pulmonary artery systolic pressure. The tricuspid regurgitant velocity is 2.69 m/s, and with an assumed right atrial pressure of 3 mmHg, the estimated right ventricular systolic pressure is 31.9 mmHg.  Left Atrium: Left atrial size was moderately dilated.  Right Atrium: Right atrial size was mildly dilated.  Pericardium: There  is no evidence of pericardial effusion.  Mitral Valve: The mitral valve is myxomatous. Moderate mitral annular calcification. Moderate to severe mitral valve regurgitation, with centrally-directed jet. No evidence of mitral valve stenosis.  Tricuspid Valve: The tricuspid valve is normal in structure. Tricuspid valve regurgitation is mild to moderate.  Aortic Valve: Possible paradoxical low-flow low-gradient severe aortic stenosis with low stroke volume. Questionable validity of dimensionless index and AV area by continuity equation (both suggest severe AS) due to irregular rhythm and possible erroneous placement of the pulsed Doppler sample volume in the LV outflow tract. The AS jet remains early peaking (acceleration time <100 ms). The aortic valve is tricuspid. There is severe calcifcation of the aortic valve. There is severe thickening of the aortic valve. Aortic valve regurgitation is not visualized. Severe aortic stenosis is present. Aortic valve mean gradient measures 15.6 mmHg. Aortic valve peak gradient measures 25.6  mmHg. Aortic valve area, by VTI measures 0.70 cm.  Pulmonic Valve: The pulmonic valve was normal in structure. Pulmonic valve regurgitation is not visualized.  Aorta: The aortic root and ascending aorta are structurally normal, with no evidence of dilitation.  Venous: The inferior vena cava is normal in size with greater than 50% respiratory variability, suggesting right atrial pressure of 3 mmHg.  IAS/Shunts: No atrial level shunt detected by color flow Doppler.   LEFT VENTRICLE PLAX 2D LVIDd:         3.40 cm   Diastology LVIDs:         2.10 cm   LV e' medial:    7.36 cm/s LV PW:         1.20 cm   LV E/e' medial:  21.7 LV IVS:        1.60 cm   LV e' lateral:   9.38 cm/s LVOT diam:     2.00 cm   LV E/e' lateral: 17.1 LV SV:         43 LV SV Index:   25 LVOT Area:     3.14 cm   RIGHT VENTRICLE RVSP:           31.9 mmHg  LEFT ATRIUM             Index        RIGHT ATRIUM           Index LA diam:        4.50 cm 2.62 cm/m   RA Pressure: 3.00 mmHg LA Vol (A2C):   56.6 ml 32.94 ml/m  RA Area:     19.90 cm LA Vol (A4C):   58.6 ml 34.11 ml/m  RA Volume:   65.90 ml  38.36 ml/m LA Biplane Vol: 59.0 ml 34.34 ml/m AORTIC VALVE AV Area (Vmax):    0.78 cm AV Area (Vmean):   0.72 cm AV Area (VTI):     0.70 cm AV Vmax:           253.00 cm/s AV Vmean:          184.800 cm/s AV VTI:            0.619 m AV Peak Grad:      25.6 mmHg AV Mean Grad:      15.6 mmHg LVOT Vmax:         62.94 cm/s LVOT Vmean:        42.300 cm/s LVOT VTI:          0.137 m LVOT/AV VTI ratio: 0.22  AORTA Ao Root diam: 3.30 cm Ao  Asc diam:  3.20 cm  MITRAL VALVE                TRICUSPID VALVE MV Area (PHT): 3.18 cm     TR Peak grad:   28.9 mmHg MV Decel Time: 239 msec     TR Vmax:        269.00 cm/s MV E velocity: 160.00 cm/s  Estimated RAP:  3.00 mmHg RVSP:           31.9 mmHg  SHUNTS Systemic VTI:  0.14 m Systemic Diam: 2.00 cm  Rachelle Hora Croitoru MD Electronically signed by Thurmon Fair  MD Signature Date/Time: 02/18/2022/3:30:31 PM    Final              EKG:  EKG is not ordered today.    Recent Labs: 05/20/2022: ALT 13; BUN 23; Creatinine, Ser 0.98; Hemoglobin 13.9; Platelets 239.0; Potassium 4.2; Sodium 141; TSH 3.73  Recent Lipid Panel    Component Value Date/Time   CHOL 186 05/20/2022 1042   TRIG 161.0 (H) 05/20/2022 1042   TRIG 63 05/17/2006 1034   HDL 62.90 05/20/2022 1042   CHOLHDL 3 05/20/2022 1042   VLDL 32.2 05/20/2022 1042   LDLCALC 91 05/20/2022 1042   LDLDIRECT 97.0 09/10/2015 1158    Risk Assessment/Calculations:   CHA2DS2-VASc Score = 6   This indicates a 9.7% annual risk of stroke. The patient's score is based upon: CHF History: 0 HTN History: 1 Diabetes History: 0 Stroke History: 2 Vascular Disease History: 0 Age Score: 2 Gender Score: 1    Home Medications   Current Meds  Medication Sig   Ascorbic Acid (VITAMIN C GUMMIE) 120 MG CHEW Chew 250 mg by mouth daily.   atorvastatin (LIPITOR) 20 MG tablet Take 1 tablet (20 mg total) by mouth daily.   Calcium Carbonate (CALCIUM 500 PO) Take 500 mg by mouth daily.   diltiazem (CARDIZEM CD) 240 MG 24 hr capsule TAKE 1 CAPSULE BY MOUTH EVERY DAY   diphenhydrAMINE (BENADRYL) 25 mg capsule Take 25 mg by mouth at bedtime.   ELIQUIS 5 MG TABS tablet TAKE 1 TABLET BY MOUTH TWICE A DAY   hydrochlorothiazide (HYDRODIURIL) 25 MG tablet Take 1 tablet (25 mg total) by mouth daily.   levothyroxine (SYNTHROID) 75 MCG tablet Take 1 tablet (75 mcg total) by mouth daily.   Multiple Vitamins-Minerals (MULTIVITAMIN ADULT) TABS Take 1 tablet by mouth daily.   pantoprazole (PROTONIX) 40 MG tablet Take 1 tablet (40 mg total) by mouth daily.   potassium gluconate 595 (99 K) MG TABS tablet Take 595 mg by mouth daily.   valsartan (DIOVAN) 320 MG tablet Take 1 tablet (320 mg total) by mouth daily.     Review of Systems      All other systems reviewed and are otherwise negative except as noted  above.  Physical Exam    VS:  BP (!) 146/84   Pulse 76   Ht 5\' 6"  (1.676 m)   Wt 163 lb 9.6 oz (74.2 kg)   SpO2 96%   BMI 26.41 kg/m  , BMI Body mass index is 26.41 kg/m.  Wt Readings from Last 3 Encounters:  10/10/22 163 lb 9.6 oz (74.2 kg)  05/20/22 160 lb 6.4 oz (72.8 kg)  02/18/22 159 lb 9.6 oz (72.4 kg)     GEN: Well nourished, well developed, in no acute distress. HEENT: normal. Neck: Supple, no JVD, carotid bruits, or masses. Cardiac: Irregularly irregular, no murmurs, rubs, or gallops. No clubbing,  cyanosis, edema.  Radials/PT 2+ and equal bilaterally.  Respiratory:  Respirations regular and unlabored, clear to auscultation bilaterally. GI: Soft, nontender, nondistended. MS: No deformity or atrophy. Skin: Warm and dry, no rash. Neuro:  Strength and sensation are intact. Psych: Normal affect.  Assessment & Plan    Nonrheumatic mitral valve regurgitation -stable on last echo 9/23 -continue current medications  Permanent atrial fibrillation -asymptomatic -continue Eliquis 5mg  BID -continue cardizem 240mg  daily  Severe  aortic stenosis by prior echo -AVA 0.70 cm2 -she does have the occasional dizzy spell with exertion (when she is in her garden working), but no syncope. -we plan to follow-up in a few months with repeat echo -she is reminded to call us if symptoms progress  4. Hypertension -Will allow window of HTN due to AS and age -continue to monitor at home    Disposition: Follow up 5 months with [name], MD or APP.  Signed, Sharlene Dory, PA-C 10/10/2022, 12:10 PM [facility]

## 2022-10-10 ENCOUNTER — Ambulatory Visit: Payer: Medicare Other | Attending: Cardiology | Admitting: Physician Assistant

## 2022-10-10 ENCOUNTER — Encounter: Payer: Self-pay | Admitting: Physician Assistant

## 2022-10-10 VITALS — BP 146/84 | HR 76 | Ht 66.0 in | Wt 163.6 lb

## 2022-10-10 DIAGNOSIS — I38 Endocarditis, valve unspecified: Secondary | ICD-10-CM

## 2022-10-10 DIAGNOSIS — I350 Nonrheumatic aortic (valve) stenosis: Secondary | ICD-10-CM

## 2022-10-10 DIAGNOSIS — I4821 Permanent atrial fibrillation: Secondary | ICD-10-CM | POA: Diagnosis not present

## 2022-10-10 DIAGNOSIS — I340 Nonrheumatic mitral (valve) insufficiency: Secondary | ICD-10-CM

## 2022-10-10 NOTE — Patient Instructions (Signed)
Medication Instructions:  Your physician recommends that you continue on your current medications as directed. Please refer to the Current Medication list given to you today.  *If you need a refill on your cardiac medications before your next appointment, please call your pharmacy*  Lab Work: None ordered If you have labs (blood work) drawn today and your tests are completely normal, you will receive your results only by: MyChart Message (if you have MyChart) OR A paper copy in the mail If you have any lab test that is abnormal or we need to change your treatment, we will call you to review the results.  Follow-Up: At [facility], you and your health needs are our priority.  As part of our continuing mission to provide you with exceptional heart care, we have created designated Provider Care Teams.  These Care Teams include your primary Cardiologist (physician) and Advanced Practice Providers (APPs -  Physician Assistants and Nurse Practitioners) who all work together to provide you with the care you need, when you need it.  We recommend signing up for the patient portal called "MyChart".  Sign up information is provided on this After Visit Summary.  MyChart is used to connect with patients for Virtual Visits (Telemedicine).  Patients are able to view lab/test results, encounter notes, upcoming appointments, etc.  Non-urgent messages can be sent to your provider as well.   To learn more about what you can do with MyChart, go to [email or web address].    Your next appointment:   5-6 month(s)  Provider:   Dr [name], Dr Anne Fu or Jari Favre PA-C  Low-Sodium Eating Plan Sodium, which is an element that makes up salt, helps you maintain a healthy balance of fluids in your body. Too much sodium can increase your blood pressure and cause fluid and waste to be held in your body. Your health care provider or dietitian may recommend following this plan if you have high blood pressure  (hypertension), kidney disease, liver disease, or heart failure. Eating less sodium can help lower your blood pressure, reduce swelling, and protect your heart, liver, and kidneys. What are tips for following this plan? Reading food labels The Nutrition Facts label lists the amount of sodium in one serving of the food. If you eat more than one serving, you must multiply the listed amount of sodium by the number of servings. Choose foods with less than 140 mg of sodium per serving. Avoid foods with 300 mg of sodium or more per serving. Shopping  Look for lower-sodium products, often labeled as "low-sodium" or "no salt added." Always check the sodium content, even if foods are labeled as "unsalted" or "no salt added." Buy fresh foods. Avoid canned foods and pre-made or frozen meals. Avoid canned, cured, or processed meats. Buy breads that have less than 80 mg of sodium per slice. Cooking  Eat more home-cooked food and less restaurant, buffet, and fast food. Avoid adding salt when cooking. Use salt-free seasonings or herbs instead of table salt or sea salt. Check with your health care provider or pharmacist before using salt substitutes. Cook with plant-based oils, such as canola, sunflower, or olive oil. Meal planning When eating at a restaurant, ask that your food be prepared with less salt or no salt, if possible. Avoid dishes labeled as brined, pickled, cured, smoked, or made with soy sauce, miso, or teriyaki sauce. Avoid foods that contain MSG (monosodium glutamate). MSG is sometimes added to [place] food, bouillon, and some canned foods. Make meals that  can be grilled, baked, poached, roasted, or steamed. These are generally made with less sodium. General information Most people on this plan should limit their sodium intake to 1,500-2,000 mg (milligrams) of sodium each day. What foods should I eat? Fruits Fresh, frozen, or canned fruit. Fruit juice. Vegetables Fresh or frozen  vegetables. "No salt added" canned vegetables. "No salt added" tomato sauce and paste. Low-sodium or reduced-sodium tomato and vegetable juice. Grains Low-sodium cereals, including oats, puffed wheat and rice, and shredded wheat. Low-sodium crackers. Unsalted rice. Unsalted pasta. Low-sodium bread. Whole-grain breads and whole-grain pasta. Meats and other proteins Fresh or frozen (no salt added) meat, poultry, seafood, and fish. Low-sodium canned tuna and salmon. Unsalted nuts. Dried peas, beans, and lentils without added salt. Unsalted canned beans. Eggs. Unsalted nut butters. Dairy Milk. Soy milk. Cheese that is naturally low in sodium, such as ricotta cheese, fresh mozzarella, or Swiss cheese. Low-sodium or reduced-sodium cheese. Cream cheese. Yogurt. Seasonings and condiments Fresh and dried herbs and spices. Salt-free seasonings. Low-sodium mustard and ketchup. Sodium-free salad dressing. Sodium-free light mayonnaise. Fresh or refrigerated horseradish. Lemon juice. Vinegar. Other foods Homemade, reduced-sodium, or low-sodium soups. Unsalted popcorn and pretzels. Low-salt or salt-free chips. The items listed above may not be a complete list of foods and beverages you can eat. Contact a dietitian for more information. What foods should I avoid? Vegetables Sauerkraut, pickled vegetables, and relishes. Olives. [place] fries. Onion rings. Regular canned vegetables (not low-sodium or reduced-sodium). Regular canned tomato sauce and paste (not low-sodium or reduced-sodium). Regular tomato and vegetable juice (not low-sodium or reduced-sodium). Frozen vegetables in sauces. Grains Instant hot cereals. Bread stuffing, pancake, and biscuit mixes. Croutons. Seasoned rice or pasta mixes. Noodle soup cups. Boxed or frozen macaroni and cheese. Regular salted crackers. Self-rising flour. Meats and other proteins Meat or fish that is salted, canned, smoked, spiced, or pickled. Precooked or cured meat, such as  sausages or meat loaves. [place] Salines. Ham. Pepperoni. Hot dogs. Corned beef. Chipped beef. Salt pork. Jerky. Pickled herring. Anchovies and sardines. Regular canned tuna. Salted nuts. Dairy Processed cheese and cheese spreads. Hard cheeses. Cheese curds. Blue cheese. Feta cheese. String cheese. Regular cottage cheese. Buttermilk. Canned milk. Fats and oils Salted butter. Regular margarine. Ghee. Bacon fat. Seasonings and condiments Onion salt, garlic salt, seasoned salt, table salt, and sea salt. Canned and packaged gravies. Worcestershire sauce. Tartar sauce. Barbecue sauce. Teriyaki sauce. Soy sauce, including reduced-sodium. Steak sauce. Fish sauce. Oyster sauce. Cocktail sauce. Horseradish that you find on the shelf. Regular ketchup and mustard. Meat flavorings and tenderizers. Bouillon cubes. Hot sauce. Pre-made or packaged marinades. Pre-made or packaged taco seasonings. Relishes. Regular salad dressings. Salsa. Other foods Salted popcorn and pretzels. Corn chips and puffs. Potato and tortilla chips. Canned or dried soups. Pizza. Frozen entrees and pot pies. The items listed above may not be a complete list of foods and beverages you should avoid. Contact a dietitian for more information. Summary Eating less sodium can help lower your blood pressure, reduce swelling, and protect your heart, liver, and kidneys. Most people on this plan should limit their sodium intake to 1,500-2,000 mg (milligrams) of sodium each day. Canned, boxed, and frozen foods are high in sodium. Restaurant foods, fast foods, and pizza are also very high in sodium. You also get sodium by adding salt to food. Try to cook at home, eat more fresh fruits and vegetables, and eat less fast food and canned, processed, or prepared foods. This information is not intended to replace advice given  to you by your health care provider. Make sure you discuss any questions you have with your health care provider. Document Revised: 04/29/2019  Document Reviewed: 04/24/2019 Elsevier Patient Education  [place] Many factors influence your heart health, including eating and exercise habits. Heart health is also called coronary health. Coronary risk increases with abnormal blood fat (lipid) levels. A heart-healthy eating plan includes limiting unhealthy fats, increasing healthy fats, limiting salt (sodium) intake, and making other diet and lifestyle changes. What is my plan? Your health care provider may recommend that: You limit your fat intake to _________% or less of your total calories each day. You limit your saturated fat intake to _________% or less of your total calories each day. You limit the amount of cholesterol in your diet to less than _________ mg per day. You limit the amount of sodium in your diet to less than _________ mg per day. What are tips for following this plan? Cooking Cook foods using methods other than frying. Baking, boiling, grilling, and broiling are all good options. Other ways to reduce fat include: Removing the skin from poultry. Removing all visible fats from meats. Steaming vegetables in water or broth. Meal planning  At meals, imagine dividing your plate into fourths: Fill one-half of your plate with vegetables and green salads. Fill one-fourth of your plate with whole grains. Fill one-fourth of your plate with lean protein foods. Eat 2-4 cups of vegetables per day. One cup of vegetables equals 1 cup (91 g) broccoli or cauliflower florets, 2 medium carrots, 1 large bell pepper, 1 large sweet potato, 1 large tomato, 1 medium white potato, 2 cups (150 g) raw leafy greens. Eat 1-2 cups of fruit per day. One cup of fruit equals 1 small apple, 1 large banana, 1 cup (237 g) mixed fruit, 1 large orange,  cup (82 g) dried fruit, 1 cup (240 mL) 100% fruit juice. Eat more foods that contain soluble fiber. Examples include apples, broccoli, carrots, beans, peas, and  barley. Aim to get 25-30 g of fiber per day. Increase your consumption of legumes, nuts, and seeds to 4-5 servings per week. One serving of dried beans or legumes equals  cup (90 g) cooked, 1 serving of nuts is  oz (12 almonds, 24 pistachios, or 7 walnut halves), and 1 serving of seeds equals  oz (8 g). Fats Choose healthy fats more often. Choose monounsaturated and polyunsaturated fats, such as olive and canola oils, avocado oil, flaxseeds, walnuts, almonds, and seeds. Eat more omega-3 fats. Choose salmon, mackerel, sardines, tuna, flaxseed oil, and ground flaxseeds. Aim to eat fish at least 2 times each week. Check food labels carefully to identify foods with trans fats or high amounts of saturated fat. Limit saturated fats. These are found in animal products, such as meats, butter, and cream. Plant sources of saturated fats include palm oil, palm kernel oil, and coconut oil. Avoid foods with partially hydrogenated oils in them. These contain trans fats. Examples are stick margarine, some tub margarines, cookies, crackers, and other baked goods. Avoid fried foods. General information Eat more home-cooked food and less restaurant, buffet, and fast food. Limit or avoid alcohol. Limit foods that are high in added sugar and simple starches such as foods made using white refined flour (white breads, pastries, sweets). Lose weight if you are overweight. Losing just 5-10% of your body weight can help your overall health and prevent diseases such as diabetes and heart disease. Monitor your sodium intake,  especially if you have high blood pressure. Talk with your health care provider about your sodium intake. Try to incorporate more vegetarian meals weekly. What foods should I eat? Fruits All fresh, canned (in natural juice), or frozen fruits. Vegetables Fresh or frozen vegetables (raw, steamed, roasted, or grilled). Green salads. Grains Most grains. Choose whole wheat and whole grains most of the  time. Rice and pasta, including brown rice and pastas made with whole wheat. Meats and other proteins Lean, well-trimmed beef, veal, pork, and lamb. Chicken and [place] without skin. All fish and shellfish. Wild duck, rabbit, pheasant, and venison. Egg whites or low-cholesterol egg substitutes. Dried beans, peas, lentils, and tofu. Seeds and most nuts. Dairy Low-fat or nonfat cheeses, including ricotta and mozzarella. Skim or 1% milk (liquid, powdered, or evaporated). Buttermilk made with low-fat milk. Nonfat or low-fat yogurt. Fats and oils Non-hydrogenated (trans-free) margarines. Vegetable oils, including soybean, sesame, sunflower, olive, avocado, peanut, safflower, corn, canola, and cottonseed. Salad dressings or mayonnaise made with a vegetable oil. Beverages Water (mineral or sparkling). Coffee and tea. Unsweetened ice tea. Diet beverages. Sweets and desserts Sherbet, gelatin, and fruit ice. Small amounts of dark chocolate. Limit all sweets and desserts. Seasonings and condiments All seasonings and condiments. The items listed above may not be a complete list of foods and beverages you can eat. Contact a dietitian for more options. What foods should I avoid? Fruits Canned fruit in heavy syrup. Fruit in cream or butter sauce. Fried fruit. Limit coconut. Vegetables Vegetables cooked in cheese, cream, or butter sauce. Fried vegetables. Grains Breads made with saturated or trans fats, oils, or whole milk. Croissants. Sweet rolls. Donuts. High-fat crackers, such as cheese crackers and chips. Meats and other proteins Fatty meats, such as hot dogs, ribs, sausage, bacon, rib-eye roast or steak. High-fat deli meats, such as salami and bologna. Caviar. Domestic duck and goose. Organ meats, such as liver. Dairy Cream, sour cream, cream cheese, and creamed cottage cheese. Whole-milk cheeses. Whole or 2% milk (liquid, evaporated, or condensed). Whole buttermilk. Cream sauce or high-fat cheese  sauce. Whole-milk yogurt. Fats and oils Meat fat, or shortening. Cocoa butter, hydrogenated oils, palm oil, coconut oil, palm kernel oil. Solid fats and shortenings, including bacon fat, salt pork, lard, and butter. Nondairy cream substitutes. Salad dressings with cheese or sour cream. Beverages Regular sodas and any drinks with added sugar. Sweets and desserts Frosting. Pudding. Cookies. Cakes. Pies. Milk chocolate or white chocolate. Buttered syrups. Full-fat ice cream or ice cream drinks. The items listed above may not be a complete list of foods and beverages to avoid. Contact a dietitian for more information. Summary Heart-healthy meal planning includes limiting unhealthy fats, increasing healthy fats, limiting salt (sodium) intake and making other diet and lifestyle changes. Lose weight if you are overweight. Losing just 5-10% of your body weight can help your overall health and prevent diseases such as diabetes and heart disease. Focus on eating a balance of foods, including fruits and vegetables, low-fat or nonfat dairy, lean protein, nuts and legumes, whole grains, and heart-healthy oils and fats. This information is not intended to replace advice given to you by your health care provider. Make sure you discuss any questions you have with your health care provider. Document Revised: 06/28/2021 Document Reviewed: 06/28/2021 Elsevier Patient Education  [place].

## 2022-10-26 ENCOUNTER — Other Ambulatory Visit: Payer: Self-pay | Admitting: Cardiology

## 2022-11-21 ENCOUNTER — Other Ambulatory Visit: Payer: Self-pay | Admitting: Cardiovascular Disease

## 2022-11-21 DIAGNOSIS — I4821 Permanent atrial fibrillation: Secondary | ICD-10-CM

## 2022-11-22 NOTE — Telephone Encounter (Signed)
Prescription refill request for Eliquis received. Indication:afib Last office visit:5/24 Scr:0.98  12/23 Age: 89 Weight:74.2  kg  Prescription refilled

## 2023-02-21 ENCOUNTER — Ambulatory Visit (HOSPITAL_COMMUNITY): Payer: Medicare Other | Attending: Cardiology

## 2023-02-21 DIAGNOSIS — I350 Nonrheumatic aortic (valve) stenosis: Secondary | ICD-10-CM | POA: Diagnosis not present

## 2023-02-21 LAB — ECHOCARDIOGRAM COMPLETE
AR max vel: 0.69 cm2
AV Area VTI: 0.61 cm2
AV Area mean vel: 0.63 cm2
AV Mean grad: 25 mmHg
AV Peak grad: 41 mmHg
Ao pk vel: 3.2 m/s
S' Lateral: 2.3 cm

## 2023-03-07 ENCOUNTER — Ambulatory Visit: Payer: Medicare Other | Attending: Cardiology | Admitting: Cardiology

## 2023-03-07 ENCOUNTER — Encounter: Payer: Self-pay | Admitting: Cardiology

## 2023-03-07 VITALS — BP 118/80 | HR 73 | Ht 67.5 in | Wt 161.4 lb

## 2023-03-07 DIAGNOSIS — I340 Nonrheumatic mitral (valve) insufficiency: Secondary | ICD-10-CM

## 2023-03-07 DIAGNOSIS — I38 Endocarditis, valve unspecified: Secondary | ICD-10-CM | POA: Diagnosis not present

## 2023-03-07 DIAGNOSIS — I350 Nonrheumatic aortic (valve) stenosis: Secondary | ICD-10-CM | POA: Diagnosis not present

## 2023-03-07 DIAGNOSIS — I4821 Permanent atrial fibrillation: Secondary | ICD-10-CM | POA: Diagnosis not present

## 2023-03-07 NOTE — Progress Notes (Signed)
Cardiology Office Note:  .   Date:  03/07/2023  ID:  Judy Walker, DOB 06/11/1932, MRN [id number] PCP: [name], MD  [facility] Providers Cardiologist:  [name], MD Structural Heart:  [name], MD   History of Present Illness: .   Judy Walker is a 89 y.o. female Discussed with the use of AI scribe software  History of Present Illness   The patient, with a history of aortic stenosis, permanent atrial fibrillation, and hypertension, presents for a follow-up visit after a recent echocardiogram. The echocardiogram revealed progression of the aortic stenosis, with a valve area of 0.61 cm^2, mean gradient of 25 mmHg, and Vmax of 3.2 m/s. The patient also has moderate mitral valve annular calcification and moderate mitral valve regurgitation.  Despite these findings, the patient reports feeling "great" overall. However, she has been experiencing episodes of dizziness, particularly when standing or exerting herself for extended periods. This has led her to purchase a walker with a seat for use during long outings, Walker as grocery shopping. She also notes that she needs to rest frequently when exerting herself, Walker as when vacuuming. She denies any breathlessness but expresses concern about feeling as though she might pass out during these episodes.  The patient's current medication regimen includes hydrochlorothiazide 25 mg daily, valsartan 320 mg daily, and Eliquis for atrial fibrillation. The patient was found to be in atrial fibrillation during the visit, but her heart rate was well-controlled and she reported feeling okay overall.  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 She reports that she is not as active as she used to be and has had to adjust her lifestyle accordingly. Despite these changes, she remains positive and is open to discussing potential interventions for her progressing aortic stenosis. Has  seen Dr. [name] in the past.         Studies Reviewed: Marland Kitchen   EKG Interpretation Date/Time:  Tuesday March 07 2023 09:11:05 EDT Ventricular Rate:  73 PR Interval:    QRS Duration:  72 QT Interval:  368 QTC Calculation: 405 R Axis:   73  Text Interpretation: Atrial fibrillation Nonspecific ST abnormality Abnormal QRS-T angle, consider primary T wave abnormality When compared with ECG of 27-Jun-2019 10:15, Atrial fibrillation has replaced Sinus rhythm Confirmed by [name] ([id number]) on 03/07/2023 9:19:11 AM    Results LABS Hb: 13.9 Cr: 0.9 LDL: 91  DIAGNOSTIC Echocardiogram: Aortic stenosis has progressed. Aortic valve area by VTI is 0.61 cm. Mean gradient is 25 mmHg. Vmax is 3.2 m/s. Dimensionless index is 0.15. Moderate mitral valve annular calcification and moderate mitral valve regurgitation noted. (02/21/2023)  Risk Assessment/Calculations:            Physical Exam:   VS:  BP 118/80   Pulse 73   Ht 5' 7.5" (1.715 m)   Wt 161 lb 6.4 oz (73.2 kg)   SpO2 96%   BMI 24.91 kg/m    Wt Readings from Last 3 Encounters:  03/07/23 161 lb 6.4 oz (73.2 kg)  10/10/22 163 lb 9.6 oz (74.2 kg)  05/20/22 160 lb 6.4 oz (72.8 kg)    GEN: Well nourished, well developed in no acute distress NECK: No JVD; No carotid bruits CARDIAC: IRRR, 3/6 systolic murmur, no rubs, gallops RESPIRATORY:  Clear to auscultation without rales, wheezing or rhonchi  ABDOMEN: Soft, non-tender, non-distended EXTREMITIES:  No edema; No deformity   ASSESSMENT AND PLAN: .  Assessment and Plan    Aortic Stenosis, severe Progression noted on recent echocardiogram (aortic valve area 0.61 cm^2, mean gradient 25 mmHg, Vmax 3.2 m/s, dimensionless index 0.15). Patient reports dizziness and fatigue with exertion. Discussed potential for TAVR procedure. -Schedule consultation with Structural Heart team for further discussion and evaluation of TAVR. Dr. [name] has seen in the past.   Atrial  Fibrillation Persistent, longstanding. Currently on Eliquis for stroke prevention. Heart rate well controlled. -Continue Eliquis as prescribed.  Hypertension Well controlled on current regimen of Hydrochlorothiazide 25mg  daily and Valsartan 320mg  daily. -Continue current antihypertensive regimen.  Follow-up in 1 year, or sooner if needed since she is seeing structural heart team.              Signed, [name], MD

## 2023-03-07 NOTE — Patient Instructions (Signed)
Medication Instructions:  Your physician recommends that you continue on your current medications as directed. Please refer to the Current Medication list given to you today.  *If you need a refill on your cardiac medications before your next appointment, please call your pharmacy*  Lab Work: None ordered today.  Testing/Procedures: None ordered today.  Follow-Up: At [facility], you and your health needs are our priority.  As part of our continuing mission to provide you with exceptional heart care, we have created designated Provider Care Teams.  These Care Teams include your primary Cardiologist (physician) and Advanced Practice Providers (APPs -  Physician Assistants and Nurse Practitioners) who all work together to provide you with the care you need, when you need it.  We recommend signing up for the patient portal called "MyChart".  Sign up information is provided on this After Visit Summary.  MyChart is used to connect with patients for Virtual Visits (Telemedicine).  Patients are able to view lab/test results, encounter notes, upcoming appointments, etc.  Non-urgent messages can be sent to your provider as well.   To learn more about what you can do with MyChart, go to [email or web address].    Your next appointment:   12 month(s)  The format for your next appointment:   In Person  Provider:   [name], MD {   Other Instructions ***

## 2023-03-20 ENCOUNTER — Ambulatory Visit: Payer: Medicare Other | Attending: Cardiovascular Disease | Admitting: Cardiovascular Disease

## 2023-03-20 ENCOUNTER — Encounter: Payer: Self-pay | Admitting: Cardiovascular Disease

## 2023-03-20 VITALS — BP 120/86 | HR 81 | Ht 66.75 in | Wt 164.0 lb

## 2023-03-20 DIAGNOSIS — Z01810 Encounter for preprocedural cardiovascular examination: Secondary | ICD-10-CM

## 2023-03-20 DIAGNOSIS — I350 Nonrheumatic aortic (valve) stenosis: Secondary | ICD-10-CM | POA: Diagnosis not present

## 2023-03-20 NOTE — H&P (View-Only) (Signed)
Cardiology Office Note:    Date:  03/20/2023   ID:  Judy Walker, DOB [birth year], MRN [id number]  PCP:  [name], MD   [facility] Providers Cardiologist:  [name], MD Structural Heart:  [name], MD    Referring MD: [name], MD   Chief Complaint  Patient presents with   Aortic Stenosis    History of Present Illness:    Judy Walker is a 89 y.o. female presenting for follow-up of aortic stenosis, referred by Dr. Anne Fu.  I am seeing the patient once before and we elected to monitor her aortic stenosis with clinical and echo surveillance.  The patient is followed for permanent atrial fibrillation, aortic stenosis, and moderate mitral regurgitation.  The patient had a recent echocardiogram demonstrating progression of her aortic stenosis with findings consistent with severe paradoxical low-flow low gradient aortic stenosis.  Her peak and mean transvalvular gradients are 41 and 25 mmHg, respectively.  The dimensionless index is 0.15 and the stroke-volume index is 24.  LVEF 52% and calculated aortic valve area is 0.61 cm.  The patient is here with multiple family members today.  She has noticed progressive fatigue and shortness of breath with physical activity.  She now has to stop and rest when she is doing her chores.  She continues to live independently and she manages to keep up with her housework.  She can do about 10 to 15 minutes of work before resting.  She states that when she vacuums now, she just does 1 room at a time.  She also begins to feel very lightheaded with exertion and she has to sit down to rest or she thinks she might pass out.  She has not had frank syncope or chest pain.  Her symptoms have progressed over about 3 to 6 months per her report.  She denies orthopnea or PND.  She has no dental problems and sees a dentist regularly.  She has no history of stroke or TIA.  She has had no complications of anesthesia in the  past.  Current Medications: Current Meds  Medication Sig   Ascorbic Acid (VITAMIN C GUMMIE) 120 MG CHEW Chew 250 mg by mouth daily.   atorvastatin (LIPITOR) 20 MG tablet Take 1 tablet (20 mg total) by mouth daily.   Calcium Carbonate (CALCIUM 500 PO) Take 500 mg by mouth daily.   diltiazem (CARDIZEM CD) 240 MG 24 hr capsule TAKE 1 CAPSULE BY MOUTH EVERY DAY   diphenhydrAMINE (BENADRYL) 25 mg capsule Take 25 mg by mouth at bedtime.   ELIQUIS 5 MG TABS tablet TAKE 1 TABLET BY MOUTH TWICE A DAY   hydrochlorothiazide (HYDRODIURIL) 25 MG tablet Take 1 tablet (25 mg total) by mouth daily.   levothyroxine (SYNTHROID) 75 MCG tablet Take 1 tablet (75 mcg total) by mouth daily.   Multiple Vitamins-Minerals (MULTIVITAMIN ADULT) TABS Take 1 tablet by mouth daily.   pantoprazole (PROTONIX) 40 MG tablet Take 1 tablet (40 mg total) by mouth daily.   potassium gluconate 595 (99 K) MG TABS tablet Take 595 mg by mouth daily.   valsartan (DIOVAN) 320 MG tablet Take 1 tablet (320 mg total) by mouth daily.     Allergies:   Patient has no known allergies.   ROS:   Please see the history of present illness.     All other systems reviewed and are negative.  EKGs/Labs/Other Studies Reviewed:    The following studies were reviewed today: Cardiac Studies & Procedures  ECHOCARDIOGRAM  ECHOCARDIOGRAM COMPLETE 02/21/2023  Narrative ECHOCARDIOGRAM REPORT    Patient Name:   Judy Walker Date of Exam: 02/21/2023 Medical Rec #:  [id number]         Height:       66.0 in Accession #:    [id number]        Weight:       163.6 lb Date of Birth:  1932-10-03         BSA:          1.836 m Patient Age:    89 years          BP:           146/84 mmHg Patient Gender: F                 HR:           95 bpm. Exam Location:  [address]  Procedure: 2D Echo, Color Doppler, Cardiac Doppler and 3D Echo  Indications:    Aortic Stenosis I35.0  History:        Patient has prior history of Echocardiogram  examinations, most recent 02/18/2022. Arrythmias:Atrial Fibrillation; Risk Factors:Hypertension and Dyslipidemia.  Sonographer:    Thurman Coyer RDCS Referring Phys: [phone] Miche Loughridge  IMPRESSIONS   1. Left ventricular ejection fraction, by estimation, is 55 to 60%. The left ventricle has normal function. The left ventricle has no regional wall motion abnormalities. Left ventricular diastolic function could not be evaluated. 2. Right ventricular systolic function is normal. The right ventricular size is normal. There is normal pulmonary artery systolic pressure. 3. The mitral valve is myxomatous. Moderate mitral valve regurgitation. No evidence of mitral stenosis. Moderate mitral annular calcification. 4. Tricuspid valve regurgitation is moderate. 5. The aortic valve is calcified. Aortic valve regurgitation is not visualized. Suspect paradoxical low flow low gradient severe aortic stenosis. Aortic valve area, by VTI measures 0.61 cm. Aortic valve mean gradient measures 25.0 mmHg. Aortic valve Vmax measures 3.20 m/s, Indexed AVA 0.33 cm2/m2, SVI 24 and DI 0.15. 6. The inferior vena cava is normal in size with greater than 50% respiratory variability, suggesting right atrial pressure of 3 mmHg. 7. Right atrial size was mildly dilated.  FINDINGS Left Ventricle: Left ventricular ejection fraction, by estimation, is 55 to 60%. The left ventricle has normal function. The left ventricle has no regional wall motion abnormalities. The left ventricular internal cavity size was normal in size. There is no left ventricular hypertrophy. Left ventricular diastolic function could not be evaluated due to mitral annular calcification (moderate or greater). Left ventricular diastolic function could not be evaluated.  Right Ventricle: The right ventricular size is normal. No increase in right ventricular wall thickness. Right ventricular systolic function is normal. There is normal pulmonary artery  systolic pressure. The tricuspid regurgitant velocity is 2.61 m/s, and with an assumed right atrial pressure of 3 mmHg, the estimated right ventricular systolic pressure is 30.2 mmHg.  Left Atrium: Left atrial size was normal in size.  Right Atrium: Right atrial size was mildly dilated.  Pericardium: There is no evidence of pericardial effusion. Presence of epicardial fat layer.  Mitral Valve: The mitral valve is myxomatous. Moderate mitral annular calcification. Moderate mitral valve regurgitation. No evidence of mitral valve stenosis.  Tricuspid Valve: The tricuspid valve is normal in structure. Tricuspid valve regurgitation is moderate . No evidence of tricuspid stenosis.  Aortic Valve: The aortic valve is calcified. Aortic valve regurgitation is not visualized. Suspect paradoxical low flow low gradient  aortic stenosis. Aortic valve mean gradient measures 25.0 mmHg. Aortic valve peak gradient measures 41.0 mmHg. Aortic valve area, by VTI measures 0.61 cm.  Pulmonic Valve: The pulmonic valve was normal in structure. Pulmonic valve regurgitation is not visualized. No evidence of pulmonic stenosis.  Aorta: The aortic root is normal in size and structure.  Venous: The inferior vena cava is normal in size with greater than 50% respiratory variability, suggesting right atrial pressure of 3 mmHg.  IAS/Shunts: No atrial level shunt detected by color flow Doppler.   LEFT VENTRICLE PLAX 2D LVIDd:         3.60 cm   Diastology LVIDs:         2.30 cm   LV e' medial: 5.27 cm/s LV PW:         1.00 cm LV IVS:        1.00 cm LVOT diam:     2.25 cm LV SV:         44 LV SV Index:   24        3D Volume EF: LVOT Area:     3.98 cm  3D EF:        52 % LV EDV:       71 ml LV ESV:       34 ml LV SV:        37 ml  RIGHT VENTRICLE RV Basal diam:  3.30 cm RV Mid diam:    2.60 cm RV S prime:     11.30 cm/s TAPSE (M-mode): 1.5 cm  LEFT ATRIUM             Index        RIGHT ATRIUM            Index LA diam:        3.70 cm 2.02 cm/m   RA Area:     17.10 cm LA Vol (A2C):   58.3 ml 31.75 ml/m  RA Volume:   49.70 ml  27.07 ml/m LA Vol (A4C):   31.4 ml 17.10 ml/m LA Biplane Vol: 47.5 ml 25.87 ml/m AORTIC VALVE AV Area (Vmax):    0.69 cm AV Area (Vmean):   0.63 cm AV Area (VTI):     0.61 cm AV Vmax:           320.00 cm/s AV Vmean:          235.000 cm/s AV VTI:            0.724 m AV Peak Grad:      41.0 mmHg AV Mean Grad:      25.0 mmHg LVOT Vmax:         55.55 cm/s LVOT Vmean:        37.150 cm/s LVOT VTI:          0.112 m LVOT/AV VTI ratio: 0.15  AORTA Ao Root diam: 3.20 cm Ao Asc diam:  2.90 cm  TRICUSPID VALVE TR Peak grad:   27.2 mmHg TR Vmax:        261.00 cm/s  SHUNTS Systemic VTI:  0.11 m Systemic Diam: 2.25 cm  Lavona Mound Tobb DO Electronically signed by Thomasene Ripple DO Signature Date/Time: 02/21/2023/12:59:07 PM    Final             EKG:        Recent Labs: 05/20/2022: ALT 13; BUN 23; Creatinine, Ser 0.98; Hemoglobin 13.9; Platelets 239.0; Potassium 4.2; Sodium 141; TSH 3.73  Recent Lipid Panel  Component Value Date/Time   CHOL 186 05/20/2022 1042   TRIG 161.0 (H) 05/20/2022 1042   TRIG 63 05/17/2006 1034   HDL 62.90 05/20/2022 1042   CHOLHDL 3 05/20/2022 1042   VLDL 32.2 05/20/2022 1042   LDLCALC 91 05/20/2022 1042   LDLDIRECT 97.0 09/10/2015 1158     Risk Assessment/Calculations:    CHA2DS2-VASc Score = 6   This indicates a 9.7% annual risk of stroke. The patient's score is based upon: CHF History: 0 HTN History: 1 Diabetes History: 0 Stroke History: 2 Vascular Disease History: 0 Age Score: 2 Gender Score: 1               Physical Exam:    VS:  BP 120/86   Pulse 81   Ht 5' 6.75" (1.695 m)   Wt 164 lb (74.4 kg)   SpO2 98%   BMI 25.88 kg/m     Wt Readings from Last 3 Encounters:  03/20/23 164 lb (74.4 kg)  03/07/23 161 lb 6.4 oz (73.2 kg)  10/10/22 163 lb 9.6 oz (74.2 kg)     GEN:  Well nourished, well  developed in no acute distress HEENT: Normal NECK: No JVD; No carotid bruits LYMPHATICS: No lymphadenopathy CARDIAC: Irregularly irregular with harsh 2/6 mid peaking crescendo decrescendo murmur at the right upper sternal border RESPIRATORY:  Clear to auscultation without rales, wheezing or rhonchi  ABDOMEN: Soft, non-tender, non-distended MUSCULOSKELETAL:  No edema; No deformity  SKIN: Warm and dry NEUROLOGIC:  Alert and oriented x 3 PSYCHIATRIC:  Normal affect   Assessment & Plan Nonrheumatic aortic valve stenosis The patient has severe paradoxical low-flow low gradient aortic stenosis with NYHA functional class II symptoms of progressive fatigue, exertional dyspnea, and lightheadedness.  I personally reviewed her echo images which demonstrate severe calcification and restriction of the aortic valve leaflets with Doppler findings outlined above in the HPI meeting criteria for low-flow low gradient aortic stenosis with a low stroke-volume index, low dimensionless index less than 0.25, and moderately elevated gradients with a mean gradient of 25 mmHg.  We reviewed the natural history of aortic stenosis including potential complications that can arise with progressive symptoms, heart failure, and death.  We reviewed treatment strategies including conventional surgery, transcatheter aortic valve replacement, and palliative medical therapy.  With her functional independence and strong will to live, she desires treatment of her aortic stenosis.  She has been noted in the past to have moderate to moderately severe mitral regurgitation.  On her most recent echo study, I do not think her mitral regurgitation looks any worse than 2+.  I think it would be appropriate to treat her aortic stenosis and follow her mitral regurgitation clinically.  Further evaluation will be needed to include a right and left heart catheterization as well as CT angiography studies of the heart and the chest, abdomen, and pelvis to  assess for anatomic suitability for TAVR. I have reviewed the risks, indications, and alternatives to cardiac catheterization, possible angioplasty, and stenting with the patient. Risks include but are not limited to bleeding, infection, vascular injury, stroke, myocardial infection, arrhythmia, kidney injury, radiation-related injury in the case of prolonged fluoroscopy use, emergency cardiac surgery, and death. The patient understands the risks of serious complication is 1-2 in 1000 with diagnostic cardiac cath and 1-2% or less with angioplasty/stenting.  Once her studies are completed, she will be referred for formal cardiac surgical consultation as part of a multidisciplinary approach to her care.  I demonstrated a TAVR procedural animation  to the patient and her family today and explained each step of the procedure.  We discussed the timeframe for her evaluation and scheduling of surgery.  She understands that we have to take a stepwise approach to her studies and preoperative planning.  All of her questions were answered today. Pre-procedural cardiovascular examination See discussion above.       Informed Consent   Shared Decision Making/Informed Consent The risks [stroke (1 in 1000), death (1 in 1000), kidney failure [usually temporary] (1 in 500), bleeding (1 in 200), allergic reaction [possibly serious] (1 in 200)], benefits (diagnostic support and management of coronary artery disease) and alternatives of a cardiac catheterization were discussed in detail with Judy Walker and she is willing to proceed.      Medication Adjustments/Labs and Tests Ordered: Current medicines are reviewed at length with the patient today.  Concerns regarding medicines are outlined above.  Orders Placed This Encounter  Procedures   CBC   Basic metabolic panel   No orders of the defined types were placed in this encounter.   Patient Instructions  Medication Instructions:  Your physician recommends that  you continue on your current medications as directed. Please refer to the Current Medication list given to you today.  Lab Work: CBC, BMET today If you have labs (blood work) drawn today and your tests are completely normal, you will receive your results only by: MyChart Message (if you have MyChart) OR A paper copy in the mail If you have any lab test that is abnormal or we need to change your treatment, we will call you to review the results.  Testing/Procedures: R & L Heart Catheterization Your physician has requested that you have a cardiac catheterization. Cardiac catheterization is used to diagnose and/or treat various heart conditions. Doctors may recommend this procedure for a number of different reasons. The most common reason is to evaluate chest pain. Chest pain can be a symptom of coronary artery disease (CAD), and cardiac catheterization can show whether plaque is narrowing or blocking your heart's arteries. This procedure is also used to evaluate the valves, as well as measure the blood flow and oxygen levels in different parts of your heart. For further information please visit [email or web address] Please follow instruction sheet, as given.  TAVR CT's and surgical appt (you will be called to schedule)  Follow-Up: At [facility], you and your health needs are our priority.  As part of our continuing mission to provide you with exceptional heart care, we have created designated Provider Care Teams.  These Care Teams include your primary Cardiologist (physician) and Advanced Practice Providers (APPs -  Physician Assistants and Nurse Practitioners) who all work together to provide you with the care you need, when you need it.  We recommend signing up for the patient portal called "MyChart".  Sign up information is provided on this After Visit Summary.  MyChart is used to connect with patients for Virtual Visits (Telemedicine).  Patients are able to view lab/test results,  encounter notes, upcoming appointments, etc.  Non-urgent messages can be sent to your provider as well.   To learn more about what you can do with MyChart, go to [email or web address].    Your next appointment:   Structural Team will follow-up  Provider:   [name], MD    Other Instructions       Cardiac/Peripheral Catheterization   You are scheduled for a Cardiac Catheterization on Thursday, October 24 with Dr. [name].  1. Please arrive at  the [employer] (Main Entrance A) at [facility]: [address] [place], [place] [address] at 10:00 AM (This time is two hour(s) before your procedure to ensure your preparation). Free valet parking service is available. You will check in at ADMITTING. The support person will be asked to wait in the waiting room.  It is OK to have someone drop you off and come back when you are ready to be discharged.        Special note: Every effort is made to have your procedure done on time. Please understand that emergencies sometimes delay scheduled procedures.  2. Diet: Do not eat solid foods after midnight.  You may have clear liquids until 5 AM the day of the procedure.  3. Labs: TODAY  4. Medication instructions in preparation for your procedure:   Contrast Allergy: No  DO NOT TAKE ELIQUIS for 2 days prior to procedure, resume 24 hours after procedure complete (Last dose Monday 10/21, resume Friday) DO NOT TAKE HYDROCHLOROTHIAZIDE, VALSARTAN or POTASSIUM the day before procedure or day of (HOLD Wed. 10/23 resume Friday 10/25)  On the morning of your procedure, take Aspirin 81 mg and any morning medicines NOT listed above.  You may use sips of water.  5. Plan to go home the same day, you will only stay overnight if medically necessary. 6. You MUST have a responsible adult to drive you home. 7. An adult MUST be with you the first 24 hours after you arrive home. 8. Bring a current list of your medications, and the last time  and date medication taken. 9. Bring ID and current insurance cards. 10.Please wear clothes that are easy to get on and off and wear slip-on shoes.  Thank you for allowing [place] to care for you!   -- [facility] Invasive Cardiovascular services    Signed, [name], MD  03/20/2023 4:17 PM    [facility]

## 2023-03-20 NOTE — Progress Notes (Signed)
Cardiology Office Note:    Date:  03/20/2023   ID:  Judy Walker, DOB [birth year], MRN [id number]  PCP:  Judy Boyden, MD   [facility] Providers Cardiologist:  Judy Schultz, MD Structural Heart:  Judy Bollman, MD    Referring MD: Judy Boyden, MD   Chief Complaint  Patient presents with   Aortic Stenosis    History of Present Illness:    Judy Walker is a 89 y.o. female presenting for follow-up of aortic stenosis, referred by Dr. Anne Walker.  I am seeing the patient once before and we elected to monitor her aortic stenosis with clinical and echo surveillance.  The patient is followed for permanent atrial fibrillation, aortic stenosis, and moderate mitral regurgitation.  The patient had a recent echocardiogram demonstrating progression of her aortic stenosis with findings consistent with severe paradoxical low-flow low gradient aortic stenosis.  Her peak and mean transvalvular gradients are 41 and 25 mmHg, respectively.  The dimensionless index is 0.15 and the stroke-volume index is 24.  LVEF 52% and calculated aortic valve area is 0.61 cm.  The patient is here with multiple family members today.  She has noticed progressive fatigue and shortness of breath with physical activity.  She now has to stop and rest when she is doing her chores.  She continues to live independently and she manages to keep up with her housework.  She can do about 10 to 15 minutes of work before resting.  She states that when she vacuums now, she just does 1 room at a time.  She also begins to feel very lightheaded with exertion and she has to sit down to rest or she thinks she might pass out.  She has not had frank syncope or chest pain.  Her symptoms have progressed over about 3 to 6 months per her report.  She denies orthopnea or PND.  She has no dental problems and sees a dentist regularly.  She has no history of stroke or TIA.  She has had no complications of anesthesia in the  past.  Current Medications: Current Meds  Medication Sig   Ascorbic Acid (VITAMIN C GUMMIE) 120 MG CHEW Chew 250 mg by mouth daily.   atorvastatin (LIPITOR) 20 MG tablet Take 1 tablet (20 mg total) by mouth daily.   Calcium Carbonate (CALCIUM 500 PO) Take 500 mg by mouth daily.   diltiazem (CARDIZEM CD) 240 MG 24 hr capsule TAKE 1 CAPSULE BY MOUTH EVERY DAY   diphenhydrAMINE (BENADRYL) 25 mg capsule Take 25 mg by mouth at bedtime.   ELIQUIS 5 MG TABS tablet TAKE 1 TABLET BY MOUTH TWICE A DAY   hydrochlorothiazide (HYDRODIURIL) 25 MG tablet Take 1 tablet (25 mg total) by mouth daily.   levothyroxine (SYNTHROID) 75 MCG tablet Take 1 tablet (75 mcg total) by mouth daily.   Multiple Vitamins-Minerals (MULTIVITAMIN ADULT) TABS Take 1 tablet by mouth daily.   pantoprazole (PROTONIX) 40 MG tablet Take 1 tablet (40 mg total) by mouth daily.   potassium gluconate 595 (99 K) MG TABS tablet Take 595 mg by mouth daily.   valsartan (DIOVAN) 320 MG tablet Take 1 tablet (320 mg total) by mouth daily.     Allergies:   Patient has no known allergies.   ROS:   Please see the history of present illness.     All other systems reviewed and are negative.  EKGs/Labs/Other Studies Reviewed:    The following studies were reviewed today: Cardiac Studies & Procedures  ECHOCARDIOGRAM  ECHOCARDIOGRAM COMPLETE 02/21/2023  Narrative ECHOCARDIOGRAM REPORT    Patient Name:   Judy Walker Judy Walker Date of Exam: 02/21/2023 Medical Rec #:  [id number]         Height:       66.0 in Accession #:    [id number]        Weight:       163.6 lb Date of Birth:  1932-10-03         BSA:          1.836 m Patient Age:    89 years          BP:           146/84 mmHg Patient Gender: F                 HR:           95 bpm. Exam Location:  Judy Walker  Procedure: 2D Echo, Color Doppler, Cardiac Doppler and 3D Echo  Indications:    Aortic Stenosis I35.0  History:        Patient has prior history of Echocardiogram  examinations, most recent 02/18/2022. Arrythmias:Atrial Fibrillation; Risk Factors:Hypertension and Dyslipidemia.  Sonographer:    Judy Walker RDCS Referring Phys: [phone] Judy Walker  IMPRESSIONS   1. Left ventricular ejection fraction, by estimation, is 55 to 60%. The left ventricle has normal function. The left ventricle has no regional wall motion abnormalities. Left ventricular diastolic function could not be evaluated. 2. Right ventricular systolic function is normal. The right ventricular size is normal. There is normal pulmonary artery systolic pressure. 3. The mitral valve is myxomatous. Moderate mitral valve regurgitation. No evidence of mitral stenosis. Moderate mitral annular calcification. 4. Tricuspid valve regurgitation is moderate. 5. The aortic valve is calcified. Aortic valve regurgitation is not visualized. Suspect paradoxical low flow low gradient severe aortic stenosis. Aortic valve area, by VTI measures 0.61 cm. Aortic valve mean gradient measures 25.0 mmHg. Aortic valve Vmax measures 3.20 m/s, Indexed AVA 0.33 cm2/m2, SVI 24 and DI 0.15. 6. The inferior vena cava is normal in size with greater than 50% respiratory variability, suggesting right atrial pressure of 3 mmHg. 7. Right atrial size was mildly dilated.  FINDINGS Left Ventricle: Left ventricular ejection fraction, by estimation, is 55 to 60%. The left ventricle has normal function. The left ventricle has no regional wall motion abnormalities. The left ventricular internal cavity size was normal in size. There is no left ventricular hypertrophy. Left ventricular diastolic function could not be evaluated due to mitral annular calcification (moderate or greater). Left ventricular diastolic function could not be evaluated.  Right Ventricle: The right ventricular size is normal. No increase in right ventricular wall thickness. Right ventricular systolic function is normal. There is normal pulmonary artery  systolic pressure. The tricuspid regurgitant velocity is 2.61 m/s, and with an assumed right atrial pressure of 3 mmHg, the estimated right ventricular systolic pressure is 30.2 mmHg.  Left Atrium: Left atrial size was normal in size.  Right Atrium: Right atrial size was mildly dilated.  Pericardium: There is no evidence of pericardial effusion. Presence of epicardial fat layer.  Mitral Valve: The mitral valve is myxomatous. Moderate mitral annular calcification. Moderate mitral valve regurgitation. No evidence of mitral valve stenosis.  Tricuspid Valve: The tricuspid valve is normal in structure. Tricuspid valve regurgitation is moderate . No evidence of tricuspid stenosis.  Aortic Valve: The aortic valve is calcified. Aortic valve regurgitation is not visualized. Suspect paradoxical low flow low gradient  aortic stenosis. Aortic valve mean gradient measures 25.0 mmHg. Aortic valve peak gradient measures 41.0 mmHg. Aortic valve area, by VTI measures 0.61 cm.  Pulmonic Valve: The pulmonic valve was normal in structure. Pulmonic valve regurgitation is not visualized. No evidence of pulmonic stenosis.  Aorta: The aortic root is normal in size and structure.  Venous: The inferior vena cava is normal in size with greater than 50% respiratory variability, suggesting right atrial pressure of 3 mmHg.  IAS/Shunts: No atrial level shunt detected by color flow Doppler.   LEFT VENTRICLE PLAX 2D LVIDd:         3.60 cm   Diastology LVIDs:         2.30 cm   LV e' medial: 5.27 cm/s LV PW:         1.00 cm LV IVS:        1.00 cm LVOT diam:     2.25 cm LV SV:         44 LV SV Index:   24        3D Volume EF: LVOT Area:     3.98 cm  3D EF:        52 % LV EDV:       71 ml LV ESV:       34 ml LV SV:        37 ml  RIGHT VENTRICLE RV Basal diam:  3.30 cm RV Mid diam:    2.60 cm RV S prime:     11.30 cm/s TAPSE (M-mode): 1.5 cm  LEFT ATRIUM             Index        RIGHT ATRIUM            Index LA diam:        3.70 cm 2.02 cm/m   RA Area:     17.10 cm LA Vol (A2C):   58.3 ml 31.75 ml/m  RA Volume:   49.70 ml  27.07 ml/m LA Vol (A4C):   31.4 ml 17.10 ml/m LA Biplane Vol: 47.5 ml 25.87 ml/m AORTIC VALVE AV Area (Vmax):    0.69 cm AV Area (Vmean):   0.63 cm AV Area (VTI):     0.61 cm AV Vmax:           320.00 cm/s AV Vmean:          235.000 cm/s AV VTI:            0.724 m AV Peak Grad:      41.0 mmHg AV Mean Grad:      25.0 mmHg LVOT Vmax:         55.55 cm/s LVOT Vmean:        37.150 cm/s LVOT VTI:          0.112 m LVOT/AV VTI ratio: 0.15  AORTA Ao Root diam: 3.20 cm Ao Asc diam:  2.90 cm  TRICUSPID VALVE TR Peak grad:   27.2 mmHg TR Vmax:        261.00 cm/s  SHUNTS Systemic VTI:  0.11 m Systemic Diam: 2.25 cm  Lavona Mound Tobb DO Electronically signed by Thomasene Ripple DO Signature Date/Time: 02/21/2023/12:59:07 PM    Final             EKG:        Recent Labs: 05/20/2022: ALT 13; BUN 23; Creatinine, Ser 0.98; Hemoglobin 13.9; Platelets 239.0; Potassium 4.2; Sodium 141; TSH 3.73  Recent Lipid Panel  Component Value Date/Time   CHOL 186 05/20/2022 1042   TRIG 161.0 (H) 05/20/2022 1042   TRIG 63 05/17/2006 1034   HDL 62.90 05/20/2022 1042   CHOLHDL 3 05/20/2022 1042   VLDL 32.2 05/20/2022 1042   LDLCALC 91 05/20/2022 1042   LDLDIRECT 97.0 09/10/2015 1158     Risk Assessment/Calculations:    CHA2DS2-VASc Score = 6   This indicates a 9.7% annual risk of stroke. The patient's score is based upon: CHF History: 0 HTN History: 1 Diabetes History: 0 Stroke History: 2 Vascular Disease History: 0 Age Score: 2 Gender Score: 1               Physical Exam:    VS:  BP 120/86   Pulse 81   Ht 5' 6.75" (1.695 m)   Wt 164 lb (74.4 kg)   SpO2 98%   BMI 25.88 kg/m     Wt Readings from Last 3 Encounters:  03/20/23 164 lb (74.4 kg)  03/07/23 161 lb 6.4 oz (73.2 kg)  10/10/22 163 lb 9.6 oz (74.2 kg)     GEN:  Well nourished, well  developed in no acute distress HEENT: Normal NECK: No JVD; No carotid bruits LYMPHATICS: No lymphadenopathy CARDIAC: Irregularly irregular with harsh 2/6 mid peaking crescendo decrescendo murmur at the right upper sternal border RESPIRATORY:  Clear to auscultation without rales, wheezing or rhonchi  ABDOMEN: Soft, non-tender, non-distended MUSCULOSKELETAL:  No edema; No deformity  SKIN: Warm and dry NEUROLOGIC:  Alert and oriented x 3 PSYCHIATRIC:  Normal affect   Assessment & Plan Nonrheumatic aortic valve stenosis The patient has severe paradoxical low-flow low gradient aortic stenosis with NYHA functional class II symptoms of progressive fatigue, exertional dyspnea, and lightheadedness.  I personally reviewed her echo images which demonstrate severe calcification and restriction of the aortic valve leaflets with Doppler findings outlined above in the HPI meeting criteria for low-flow low gradient aortic stenosis with a low stroke-volume index, low dimensionless index less than 0.25, and moderately elevated gradients with a mean gradient of 25 mmHg.  We reviewed the natural history of aortic stenosis including potential complications that can arise with progressive symptoms, heart failure, and death.  We reviewed treatment strategies including conventional surgery, transcatheter aortic valve replacement, and palliative medical therapy.  With her functional independence and strong will to live, she desires treatment of her aortic stenosis.  She has been noted in the past to have moderate to moderately severe mitral regurgitation.  On her most recent echo study, I do not think her mitral regurgitation looks any worse than 2+.  I think it would be appropriate to treat her aortic stenosis and follow her mitral regurgitation clinically.  Further evaluation will be needed to include a right and left heart catheterization as well as CT angiography studies of the heart and the chest, abdomen, and pelvis to  assess for anatomic suitability for TAVR. I have reviewed the risks, indications, and alternatives to cardiac catheterization, possible angioplasty, and stenting with the patient. Risks include but are not limited to bleeding, infection, vascular injury, stroke, myocardial infection, arrhythmia, kidney injury, radiation-related injury in the case of prolonged fluoroscopy use, emergency cardiac surgery, and death. The patient understands the risks of serious complication is 1-2 in 1000 with diagnostic cardiac cath and 1-2% or less with angioplasty/stenting.  Once her studies are completed, she will be referred for formal cardiac surgical consultation as part of a multidisciplinary approach to her care.  I demonstrated a TAVR procedural animation  to the patient and her family today and explained each step of the procedure.  We discussed the timeframe for her evaluation and scheduling of surgery.  She understands that we have to take a stepwise approach to her studies and preoperative planning.  All of her questions were answered today. Pre-procedural cardiovascular examination See discussion above.       Informed Consent   Shared Decision Making/Informed Consent The risks [stroke (1 in 1000), death (1 in 1000), kidney failure [usually temporary] (1 in 500), bleeding (1 in 200), allergic reaction [possibly serious] (1 in 200)], benefits (diagnostic support and management of coronary artery disease) and alternatives of a cardiac catheterization were discussed in detail with Ms. Filip and she is willing to proceed.      Medication Adjustments/Labs and Tests Ordered: Current medicines are reviewed at length with the patient today.  Concerns regarding medicines are outlined above.  Orders Placed This Encounter  Procedures   CBC   Basic metabolic panel   No orders of the defined types were placed in this encounter.   Patient Instructions  Medication Instructions:  Your physician recommends that  you continue on your current medications as directed. Please refer to the Current Medication list given to you today.  Lab Work: CBC, BMET today If you have labs (blood work) drawn today and your tests are completely normal, you will receive your results only by: MyChart Message (if you have MyChart) OR A paper copy in the mail If you have any lab test that is abnormal or we need to change your treatment, we will call you to review the results.  Testing/Procedures: R & L Heart Catheterization Your physician has requested that you have a cardiac catheterization. Cardiac catheterization is used to diagnose and/or treat various heart conditions. Doctors may recommend this procedure for a number of different reasons. The most common reason is to evaluate chest pain. Chest pain can be a symptom of coronary artery disease (CAD), and cardiac catheterization can show whether plaque is narrowing or blocking your heart's arteries. This procedure is also used to evaluate the valves, as well as measure the blood flow and oxygen levels in different parts of your heart. For further information please visit [email or web address] Please follow instruction sheet, as given.  TAVR CT's and surgical appt (you will be called to schedule)  Follow-Up: At [facility], you and your health needs are our priority.  As part of our continuing mission to provide you with exceptional heart care, we have created designated Provider Care Teams.  These Care Teams include your primary Cardiologist (physician) and Advanced Practice Providers (APPs -  Physician Assistants and Nurse Practitioners) who all work together to provide you with the care you need, when you need it.  We recommend signing up for the patient portal called "MyChart".  Sign up information is provided on this After Visit Summary.  MyChart is used to connect with patients for Virtual Visits (Telemedicine).  Patients are able to view lab/test results,  encounter notes, upcoming appointments, etc.  Non-urgent messages can be sent to your provider as well.   To learn more about what you can do with MyChart, go to [email or web address].    Your next appointment:   Structural Team will follow-up  Provider:   [name], MD    Other Instructions       Cardiac/Peripheral Catheterization   You are scheduled for a Cardiac Catheterization on Thursday, October 24 with Dr. Tonny Walker.  1. Please arrive at  the [employer] [employer] (Main Entrance A) at [facility]: [address] [place], [place] [address] at 10:00 AM (This time is two hour(s) before your procedure to ensure your preparation). Free valet parking service is available. You will check in at ADMITTING. The support person will be asked to wait in the waiting room.  It is OK to have someone drop you off and come back when you are ready to be discharged.        Special note: Every effort is made to have your procedure done on time. Please understand that emergencies sometimes delay scheduled procedures.  2. Diet: Do not eat solid foods after midnight.  You may have clear liquids until 5 AM the day of the procedure.  3. Labs: TODAY  4. Medication instructions in preparation for your procedure:   Contrast Allergy: No  DO NOT TAKE ELIQUIS for 2 days prior to procedure, resume 24 hours after procedure complete (Last dose Monday 10/21, resume Friday) DO NOT TAKE HYDROCHLOROTHIAZIDE, VALSARTAN or POTASSIUM the day before procedure or day of (HOLD Wed. 10/23 resume Friday 10/25)  On the morning of your procedure, take Aspirin 81 mg and any morning medicines NOT listed above.  You may use sips of water.  5. Plan to go home the same day, you will only stay overnight if medically necessary. 6. You MUST have a responsible adult to drive you home. 7. An adult MUST be with you the first 24 hours after you arrive home. 8. Bring a current list of your medications, and the last time  and date medication taken. 9. Bring ID and current insurance cards. 10.Please wear clothes that are easy to get on and off and wear slip-on shoes.  Thank you for allowing [place] to care for you!   -- [facility] Invasive Cardiovascular services    Signed, Judy Bollman, MD  03/20/2023 4:17 PM    [facility]

## 2023-03-20 NOTE — Addendum Note (Signed)
Addended by: Gunnar Fusi A on: 03/20/2023 05:42 PM   Modules accepted: Orders

## 2023-03-20 NOTE — Progress Notes (Addendum)
Pre Surgical Assessment: 5 M Walk Test  70M=16.72ft  5 Meter Walk Test- trial 1: 5.89 seconds 5 Meter Walk Test- trial 2: 6.08 seconds 5 Meter Walk Test- trial 3: 5.87 seconds 5 Meter Walk Test Average: 5.94 seconds  ______________________  Procedure Type: Isolated AVR Perioperative Outcome Estimate % Operative Mortality 5.06% Morbidity & Mortality 8.75% Stroke 1.56% Renal Failure 1.54% Reoperation 2.6% Prolonged Ventilation 5.19% Deep Sternal Wound Infection 0.037% Long Hospital Stay (>14 days) 4.69% Short Hospital Stay (<6 days)* 31.2%

## 2023-03-20 NOTE — Patient Instructions (Addendum)
Medication Instructions:  Your physician recommends that you continue on your current medications as directed. Please refer to the Current Medication list given to you today.  Lab Work: CBC, BMET today If you have labs (blood work) drawn today and your tests are completely normal, you will receive your results only by: MyChart Message (if you have MyChart) OR A paper copy in the mail If you have any lab test that is abnormal or we need to change your treatment, we will call you to review the results.  Testing/Procedures: R & L Heart Catheterization Your physician has requested that you have a cardiac catheterization. Cardiac catheterization is used to diagnose and/or treat various heart conditions. Doctors may recommend this procedure for a number of different reasons. The most common reason is to evaluate chest pain. Chest pain can be a symptom of coronary artery disease (CAD), and cardiac catheterization can show whether plaque is narrowing or blocking your heart's arteries. This procedure is also used to evaluate the valves, as well as measure the blood flow and oxygen levels in different parts of your heart. For further information please visit [email or web address] Please follow instruction sheet, as given.  TAVR CT's and surgical appt (you will be called to schedule)  Follow-Up: At [facility], you and your health needs are our priority.  As part of our continuing mission to provide you with exceptional heart care, we have created designated Provider Care Teams.  These Care Teams include your primary Cardiologist (physician) and Advanced Practice Providers (APPs -  Physician Assistants and Nurse Practitioners) who all work together to provide you with the care you need, when you need it.  We recommend signing up for the patient portal called "MyChart".  Sign up information is provided on this After Visit Summary.  MyChart is used to connect with patients for Virtual Visits  (Telemedicine).  Patients are able to view lab/test results, encounter notes, upcoming appointments, etc.  Non-urgent messages can be sent to your provider as well.   To learn more about what you can do with MyChart, go to [email or web address].    Your next appointment:   Structural Team will follow-up  Provider:   [name], MD    Other Instructions       Cardiac/Peripheral Catheterization   You are scheduled for a Cardiac Catheterization on Thursday, October 24 with Dr. [name].  1. Please arrive at the [facility] (Main Entrance A) at [facility]: [address] [place], [place] [address] at 10:00 AM (This time is two hour(s) before your procedure to ensure your preparation). Free valet parking service is available. You will check in at ADMITTING. The support person will be asked to wait in the waiting room.  It is OK to have someone drop you off and come back when you are ready to be discharged.        Special note: Every effort is made to have your procedure done on time. Please understand that emergencies sometimes delay scheduled procedures.  2. Diet: Do not eat solid foods after midnight.  You may have clear liquids until 5 AM the day of the procedure.  3. Labs: TODAY  4. Medication instructions in preparation for your procedure:   Contrast Allergy: No  DO NOT TAKE ELIQUIS for 2 days prior to procedure, resume 24 hours after procedure complete (Last dose Monday 10/21, resume Friday) DO NOT TAKE HYDROCHLOROTHIAZIDE, VALSARTAN or POTASSIUM the day before procedure or day of (HOLD Wed. 10/23 resume Friday  10/25)  On the morning of your procedure, take Aspirin 81 mg and any morning medicines NOT listed above.  You may use sips of water.  5. Plan to go home the same day, you will only stay overnight if medically necessary. 6. You MUST have a responsible adult to drive you home. 7. An adult MUST be with you the first 24 hours after you arrive home. 8.  Bring a current list of your medications, and the last time and date medication taken. 9. Bring ID and current insurance cards. 10.Please wear clothes that are easy to get on and off and wear slip-on shoes.  Thank you for allowing [place] to care for you!   -- [place] [facility]

## 2023-03-21 LAB — BASIC METABOLIC PANEL
BUN/Creatinine Ratio: 23 (ref 12–28)
BUN: 28 mg/dL — ABNORMAL HIGH (ref 8–27)
CO2: 26 mmol/L (ref 20–29)
Calcium: 9.6 mg/dL (ref 8.7–10.3)
Chloride: 102 mmol/L (ref 96–106)
Creatinine, Ser: 1.22 mg/dL — ABNORMAL HIGH (ref 0.57–1.00)
Glucose: 97 mg/dL (ref 70–99)
Potassium: 4.1 mmol/L (ref 3.5–5.2)
Sodium: 143 mmol/L (ref 134–144)
eGFR: 43 mL/min/{1.73_m2} — ABNORMAL LOW (ref >59)

## 2023-03-21 LAB — CBC
Hematocrit: 41.4 % (ref 34.0–46.6)
Hemoglobin: 13.7 g/dL (ref 11.1–15.9)
MCH: 34.1 pg — ABNORMAL HIGH (ref 26.6–33.0)
MCHC: 33.1 g/dL (ref 31.5–35.7)
MCV: 103 fL — ABNORMAL HIGH (ref 79–97)
Platelets: 261 10*3/uL (ref 150–450)
RBC: 4.02 x10E6/uL (ref 3.77–5.28)
RDW: 11.7 % (ref 11.7–15.4)
WBC: 6.2 10*3/uL (ref 3.4–10.8)

## 2023-03-28 ENCOUNTER — Telehealth: Payer: Self-pay | Admitting: *Deleted

## 2023-03-28 NOTE — Telephone Encounter (Signed)
Cardiac Catheterization scheduled at [facility] for: Thursday March 30, 2023 12 Noon Arrival time [facility] Main Entrance A at: 7 AM-pre-procedure hydration  Nothing to eat after midnight prior to procedure, clear liquids until 5 AM day of procedure.  Medication instructions: -Hold:  Eliquis-none 03/28/23 until post procedure  Hydrochlorothiazide/KCl/Valsartan-day before and day of procedure-per protocol GFR < 60 (40) -Other usual morning medications can be taken with sips of water including aspirin 81 mg.  Plan to go home the same day, you will only stay overnight if medically necessary.  You must have responsible adult to drive you home.  Someone must be with you the first 24 hours after you arrive home.  Reviewed procedure instructions, discussed pre-procedure hydration with patient.

## 2023-03-29 NOTE — Telephone Encounter (Signed)
Pt advised that she will need a contact name and number for the hosp to call if needed during the procedure and who to call when she is cleared to go home and will be staying with her through out the night.

## 2023-03-29 NOTE — Telephone Encounter (Signed)
Patient wants to know if someone will need to stay with her the whole time she is there.

## 2023-03-30 ENCOUNTER — Encounter (HOSPITAL_COMMUNITY)
Admission: RE | Disposition: A | Discharge status: Home / Self Care | Payer: Self-pay | Source: Home / Self Care | Attending: Cardiovascular Disease

## 2023-03-30 ENCOUNTER — Other Ambulatory Visit: Payer: Self-pay

## 2023-03-30 ENCOUNTER — Encounter: Payer: Self-pay | Admitting: Cardiology

## 2023-03-30 ENCOUNTER — Other Ambulatory Visit: Payer: Self-pay | Admitting: Cardiology

## 2023-03-30 ENCOUNTER — Ambulatory Visit (HOSPITAL_COMMUNITY)
Admission: RE | Admit: 2023-03-30 | Discharge: 2023-03-30 | Disposition: A | Discharge status: Home / Self Care | Payer: Medicare Other | Attending: Cardiovascular Disease | Admitting: Cardiovascular Disease

## 2023-03-30 DIAGNOSIS — I350 Nonrheumatic aortic (valve) stenosis: Secondary | ICD-10-CM

## 2023-03-30 DIAGNOSIS — I4821 Permanent atrial fibrillation: Secondary | ICD-10-CM | POA: Insufficient documentation

## 2023-03-30 DIAGNOSIS — I083 Combined rheumatic disorders of mitral, aortic and tricuspid valves: Secondary | ICD-10-CM | POA: Insufficient documentation

## 2023-03-30 HISTORY — PX: RIGHT/LEFT HEART CATH AND CORONARY ANGIOGRAPHY: CATH118266

## 2023-03-30 LAB — POCT I-STAT 7, (LYTES, BLD GAS, ICA,H+H)
Acid-base deficit: 1 mmol/L (ref 0.0–2.0)
Bicarbonate: 24.2 mmol/L (ref 20.0–28.0)
Calcium, Ion: 1.15 mmol/L (ref 1.15–1.40)
HCT: 32 % — ABNORMAL LOW (ref 36.0–46.0)
Hemoglobin: 10.9 g/dL — ABNORMAL LOW (ref 12.0–15.0)
O2 Saturation: 95 %
Potassium: 3.4 mmol/L — ABNORMAL LOW (ref 3.5–5.1)
Sodium: 142 mmol/L (ref 135–145)
TCO2: 25 mmol/L (ref 22–32)
pCO2 arterial: 39.4 mm[Hg] (ref 32–48)
pH, Arterial: 7.396 (ref 7.35–7.45)
pO2, Arterial: 75 mm[Hg] — ABNORMAL LOW (ref 83–108)

## 2023-03-30 LAB — POCT I-STAT EG7
Acid-base deficit: 1 mmol/L (ref 0.0–2.0)
Bicarbonate: 24.6 mmol/L (ref 20.0–28.0)
Calcium, Ion: 1.02 mmol/L — ABNORMAL LOW (ref 1.15–1.40)
HCT: 31 % — ABNORMAL LOW (ref 36.0–46.0)
Hemoglobin: 10.5 g/dL — ABNORMAL LOW (ref 12.0–15.0)
O2 Saturation: 70 %
Potassium: 3.1 mmol/L — ABNORMAL LOW (ref 3.5–5.1)
Sodium: 145 mmol/L (ref 135–145)
TCO2: 26 mmol/L (ref 22–32)
pCO2, Ven: 41.4 mm[Hg] — ABNORMAL LOW (ref 44–60)
pH, Ven: 7.382 (ref 7.25–7.43)
pO2, Ven: 38 mm[Hg] (ref 32–45)

## 2023-03-30 SURGERY — RIGHT/LEFT HEART CATH AND CORONARY ANGIOGRAPHY
Anesthesia: LOCAL

## 2023-03-30 MED ORDER — FENTANYL CITRATE (PF) 100 MCG/2ML IJ SOLN
INTRAMUSCULAR | Status: DC | PRN
  Administered 2023-03-30: 25 ug via INTRAVENOUS

## 2023-03-30 MED ORDER — LIDOCAINE HCL (PF) 1 % IJ SOLN
INTRAMUSCULAR | Status: AC
  Filled 2023-03-30: qty 30

## 2023-03-30 MED ORDER — LABETALOL HCL 5 MG/ML IV SOLN: 10.0000 mg | INTRAVENOUS | Status: DC | PRN

## 2023-03-30 MED ORDER — VERAPAMIL HCL 2.5 MG/ML IV SOLN
INTRAVENOUS | Status: AC
Start: 2023-03-30 — End: ?
  Filled 2023-03-30: qty 2

## 2023-03-30 MED ORDER — MIDAZOLAM HCL 2 MG/2ML IJ SOLN
INTRAMUSCULAR | Status: AC
  Filled 2023-03-30: qty 2

## 2023-03-30 MED ORDER — HEPARIN SODIUM (PORCINE) 1000 UNIT/ML IJ SOLN
INTRAMUSCULAR | Status: DC | PRN
  Administered 2023-03-30: 4000 [IU] via INTRAVENOUS

## 2023-03-30 MED ORDER — ASPIRIN 81 MG PO CHEW: 81.0000 mg | CHEWABLE_TABLET | ORAL | Status: DC

## 2023-03-30 MED ORDER — LIDOCAINE HCL (PF) 1 % IJ SOLN
INTRAMUSCULAR | Status: DC | PRN
  Administered 2023-03-30 (×2): 2 mL

## 2023-03-30 MED ORDER — ACETAMINOPHEN 325 MG PO TABS
650.0000 mg | ORAL_TABLET | ORAL | Status: DC | PRN
Start: 2023-03-30 — End: 2023-03-30

## 2023-03-30 MED ORDER — SODIUM CHLORIDE 0.9 % WEIGHT BASED INFUSION
1.0000 mL/kg/h | INTRAVENOUS | Status: DC
Start: 2023-03-30 — End: 2023-03-30

## 2023-03-30 MED ORDER — HEPARIN SODIUM (PORCINE) 1000 UNIT/ML IJ SOLN
INTRAMUSCULAR | Status: AC
  Filled 2023-03-30: qty 10

## 2023-03-30 MED ORDER — SODIUM CHLORIDE 0.9 % WEIGHT BASED INFUSION
3.0000 mL/kg/h | INTRAVENOUS | Status: AC
Start: 2023-03-30 — End: 2023-03-30
  Administered 2023-03-30: 3 mL/kg/h via INTRAVENOUS

## 2023-03-30 MED ORDER — FENTANYL CITRATE (PF) 100 MCG/2ML IJ SOLN
INTRAMUSCULAR | Status: AC
  Filled 2023-03-30: qty 2

## 2023-03-30 MED ORDER — VERAPAMIL HCL 2.5 MG/ML IV SOLN
INTRAVENOUS | Status: DC | PRN
  Administered 2023-03-30: 10 mL via INTRA_ARTERIAL

## 2023-03-30 MED ORDER — HEPARIN (PORCINE) IN NACL 1000-0.9 UT/500ML-% IV SOLN
INTRAVENOUS | Status: DC | PRN
  Administered 2023-03-30 (×2): 500 mL

## 2023-03-30 MED ORDER — SODIUM CHLORIDE 0.9% FLUSH: 3.0000 mL | INTRAVENOUS | Status: DC | PRN

## 2023-03-30 MED ORDER — ONDANSETRON HCL 4 MG/2ML IJ SOLN: 4.0000 mg | Freq: Four times a day (QID) | INTRAMUSCULAR | Status: DC | PRN

## 2023-03-30 MED ORDER — SODIUM CHLORIDE 0.9 % IV SOLN: 250.0000 mL | INTRAVENOUS | Status: DC | PRN

## 2023-03-30 MED ORDER — SODIUM CHLORIDE 0.9% FLUSH
3.0000 mL | Freq: Two times a day (BID) | INTRAVENOUS | Status: DC
Start: 2023-03-30 — End: 2023-03-30

## 2023-03-30 MED ORDER — IOHEXOL 350 MG/ML SOLN
INTRAVENOUS | Status: DC | PRN
  Administered 2023-03-30: 40 mL

## 2023-03-30 MED ORDER — MIDAZOLAM HCL 2 MG/2ML IJ SOLN
INTRAMUSCULAR | Status: DC | PRN
  Administered 2023-03-30: 2 mg via INTRAVENOUS

## 2023-03-30 MED ORDER — HYDRALAZINE HCL 20 MG/ML IJ SOLN: 10.0000 mg | INTRAMUSCULAR | Status: DC | PRN

## 2023-03-30 SURGICAL SUPPLY — 9 items
CATH 5FR JL3.5 JR4 ANG PIG MP (CATHETERS) IMPLANT
CATH BALLN WEDGE 5F 110CM (CATHETERS) IMPLANT
DEVICE RAD COMP TR BAND LRG (VASCULAR PRODUCTS) IMPLANT
GLIDESHEATH SLEND SS 6F .021 (SHEATH) IMPLANT
GUIDEWIRE INQWIRE 1.5J.035X260 (WIRE) IMPLANT
INQWIRE 1.5J .035X260CM (WIRE) ×1
PACK CARDIAC CATHETERIZATION (CUSTOM PROCEDURE TRAY) ×2 IMPLANT
SET ATX-X65L (MISCELLANEOUS) IMPLANT
SHEATH GLIDE SLENDER 4/5FR (SHEATH) IMPLANT

## 2023-03-30 NOTE — Discharge Instructions (Signed)

## 2023-03-30 NOTE — Plan of Care (Signed)
[facility] Tonsillectomy/Adenoidectomy, Postoperative PEDS care plan entered in error.

## 2023-03-30 NOTE — Interval H&P Note (Signed)
History and Physical Interval Note:  03/30/2023 12:04 PM  Judy Walker  has presented today for surgery, with the diagnosis of aortic stenosis.  The various methods of treatment have been discussed with the patient and family. After consideration of risks, benefits and other options for treatment, the patient has consented to  Procedure(s): RIGHT/LEFT HEART CATH AND CORONARY ANGIOGRAPHY (N/A) as a surgical intervention.  The patient's history has been reviewed, patient examined, no change in status, stable for surgery.  I have reviewed the patient's chart and labs.  Questions were answered to the patient's satisfaction.     [name]

## 2023-03-31 ENCOUNTER — Encounter (HOSPITAL_COMMUNITY): Payer: Self-pay | Admitting: Cardiovascular Disease

## 2023-04-03 ENCOUNTER — Encounter: Payer: Self-pay | Admitting: Physician Assistant

## 2023-04-03 ENCOUNTER — Ambulatory Visit (HOSPITAL_COMMUNITY)
Admission: RE | Admit: 2023-04-03 | Discharge: 2023-04-03 | Disposition: A | Discharge status: Home / Self Care | Payer: Medicare Other | Source: Ambulatory Visit | Attending: Cardiovascular Disease | Admitting: Cardiovascular Disease

## 2023-04-03 DIAGNOSIS — K449 Diaphragmatic hernia without obstruction or gangrene: Secondary | ICD-10-CM | POA: Diagnosis not present

## 2023-04-03 DIAGNOSIS — I350 Nonrheumatic aortic (valve) stenosis: Secondary | ICD-10-CM | POA: Diagnosis not present

## 2023-04-03 DIAGNOSIS — I700 Atherosclerosis of aorta: Secondary | ICD-10-CM | POA: Diagnosis not present

## 2023-04-03 DIAGNOSIS — Z01810 Encounter for preprocedural cardiovascular examination: Secondary | ICD-10-CM | POA: Diagnosis not present

## 2023-04-03 DIAGNOSIS — I517 Cardiomegaly: Secondary | ICD-10-CM | POA: Diagnosis not present

## 2023-04-03 MED ORDER — IOHEXOL 350 MG/ML SOLN
95.0000 mL | Freq: Once | INTRAVENOUS | Status: AC | PRN
  Administered 2023-04-03: 95 mL via INTRAVENOUS

## 2023-04-03 MED ORDER — METOPROLOL TARTRATE 5 MG/5ML IV SOLN
INTRAVENOUS | Status: AC
  Filled 2023-04-03: qty 5

## 2023-04-05 ENCOUNTER — Institutional Professional Consult (permissible substitution): Payer: Medicare Other | Admitting: Surgery

## 2023-04-05 ENCOUNTER — Encounter: Payer: Self-pay | Admitting: Surgery

## 2023-04-05 VITALS — BP 150/85 | HR 94 | Resp 20 | Ht 66.0 in | Wt 161.0 lb

## 2023-04-05 DIAGNOSIS — I350 Nonrheumatic aortic (valve) stenosis: Secondary | ICD-10-CM | POA: Diagnosis not present

## 2023-04-05 NOTE — Progress Notes (Signed)
Patient ID: Judy Walker, female   DOB: [birth year], 89 y.o.   MRN: [id number]  [facility]       [address]       [name] [address]             [phone]          CARDIOTHORACIC SURGERY CONSULTATION REPORT  PCP is [name], MD Referring Provider is [name], MD Primary Cardiologist is [name], MD  Reason for consultation: Severe aortic stenosis  HPI:  The patient is an 89 year old active woman with a history of hypertension, hyperlipidemia, hypothyroidism, permanent atrial fibrillation, and aortic stenosis who was referred for consideration of TAVR.  She says that she felt fine until about 5 years ago when she was noted to be in atrial fibrillation and began developing exertional fatigue and shortness of breath.  A 2D echocardiogram at that time showed a mean gradient of 10 mmHg across the aortic valve with a peak of 19 mmHg.  Stroke-volume index was 11.6 with normal left ventricular ejection fraction.  She had an echo in September 2023 showing severe thickening and calcification of the aortic valve with a mean gradient of 15.6 mmHg and a peak gradient of 25.6 mmHg.  Aortic valve area by VTI was 0.7 cm with a stroke-volume index of 25 and a dimensionless index of 0.22.  She was also felt to have moderate to severe mitral regurgitation at that time.  She was seen by Dr. [name] and it was felt that her aortic stenosis was probably still in the moderate range and continued observation was recommended.  Her most recent echo on 02/21/2023 showed an increase in the mean gradient to 25 mmHg with a peak of 41 mmHg and a valve area of 0.61 cm by VTI.  Stroke-volume index remains low at 24 with a dimensionless index of 0.15.  Mitral regurgitation is moderate.  She is here today with her daughter and son-in-law.  She is widowed and lives alone.  She reports progressive fatigue and shortness of breath with  exertion.  She said she is still taking care of her house but has to stop frequently to rest.  She said that if she pushes too hard she will develop dizziness and has to sit down to rest or thinks she might pass out.  She denies any chest pain or pressure.  She has had no orthopnea.  She denies peripheral edema.  Past Medical History:  Diagnosis Date   Anemia    Atrial fibrillation (HCC)    Benign positional vertigo 2005   Colonoscopy refused    " I elected not to have one" SOC reviewed 07/06/10; she continues to decline colonscopy   HLD (hyperlipidemia)    total cholesterol 340 in 2003, NHDL 61, TG 75. NMR Lipoprofile 2005: LDL 84 (933/285), LDL glad =<120 ideally <90. Framingham study LDL goal <130   HTN (hypertension)    Hypothyroidism    Osteopenia 08/2012, 09/2015   DEXA -1.9 (2014) -2.3 (2017)   Severe aortic stenosis     Past Surgical History:  Procedure Laterality Date   ABDOMINAL HYSTERECTOMY  2003   & BSO for cystic ovaries . No PMH of abnormal PAP   CARDIAC CATHETERIZATION     normal   CARDIOVERSION N/A 06/27/2019   Procedure: CARDIOVERSION;  Surgeon: [name], MD;  Location: [facility];  Service: Cardiovascular;  Laterality: N/A;   CATARACT EXTRACTION, BILATERAL  09/2011   Dr.[name]    CYSTOSCOPY     bladder @ age 13 for painful hematuria   ESOPHAGOGASTRODUODENOSCOPY  12/2019   prepyloric scarring, 4cm HH, biopsies with healing ulcer, neg H pylori [name])   ESOPHAGOGASTRODUODENOSCOPY  11/2019   UGI bleed - mass present @ [facility]   RIGHT/LEFT HEART CATH AND CORONARY ANGIOGRAPHY N/A 03/30/2023   Procedure: RIGHT/LEFT HEART CATH AND CORONARY ANGIOGRAPHY;  Surgeon: [name], MD;  Location: [facility] INVASIVE CV LAB;  Service: Cardiovascular;  Laterality: N/A;    Family History  Problem Relation Age of Onset   Kidney cancer Mother    Diabetes Father    Heart attack Father        in 65s   Breast cancer Sister    Stroke Sister    Cirrhosis  Sister        veins came appart and had to be glued back together   Breast cancer Sister        mets   Breast cancer Sister        53s   Ovarian cancer Sister    Alzheimer's disease Sister    Cancer Maternal Grandmother        unknown type   Diabetes Paternal Grandfather    Heart attack Brother        1 pre 40   Brain cancer Brother    Heart attack Brother    Other Brother        died at birth   Other Brother        died at birth   Heart disease Brother    Ulcerative colitis Son    Diabetes Other         4 brothers and  3 sisters    Social History   Socioeconomic History   Marital status: Widowed    Spouse name: Not on file   Number of children: 2   Years of education: Not on file   Highest education level: Not on file  Occupational History   Occupation: retired  Tobacco Use   Smoking status: Never   Smokeless tobacco: Never  Vaping Use   Vaping status: Never Used  Substance and Sexual Activity   Alcohol use: Yes    Comment:  RARELY   Drug use: No   Sexual activity: Not on file  Other Topics Concern   Not on file  Social History Narrative   Lives alone. Widow 2012   Daughter lives nearby.   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   Diet: daily fruits/vegetables, some water   Social Determinants of Health   Financial Resource Strain: Low Risk  (05/14/2021)   Overall Financial Resource Strain (CARDIA)    Difficulty of Paying Living Expenses: Not hard at all  Food Insecurity: No Food Insecurity (05/14/2021)   Hunger Vital Sign    Worried About Running Out of Food in the Last Year: Never true    Ran Out of Food in the Last Year: Never true  Transportation Needs: No Transportation Needs (05/14/2021)   PRAPARE - Administrator, Civil Service (Medical): No    Lack of Transportation (Non-Medical): No  Physical Activity: Insufficiently Active  (05/14/2021)   Exercise Vital Sign    Days of Exercise per Week: 3 days    Minutes of Exercise per Session: 10 min  Stress: No Stress Concern  Present (05/14/2021)   [employer] of Occupational Health - Occupational Stress Questionnaire    Feeling of Stress : Not at all  Social Connections: Moderately Isolated (05/14/2021)   Social Connection and Isolation Panel [NHANES]    Frequency of Communication with Friends and Family: More than three times a week    Frequency of Social Gatherings with Friends and Family: Twice a week    Attends Religious Services: More than 4 times per year    Active Member of [employer] or Organizations: No    Attends Banker Meetings: Never    Marital Status: Widowed  Intimate Partner Violence: Not At Risk (05/14/2021)   Humiliation, Afraid, Rape, and Kick questionnaire    Fear of Current or Ex-Partner: No    Emotionally Abused: No    Physically Abused: No    Sexually Abused: No    Prior to Admission medications   Medication Sig Start Date End Date Taking? Authorizing Provider  Ascorbic Acid (VITAMIN C GUMMIE) 120 MG CHEW Chew 250 mg by mouth daily. 05/15/20   [name], MD  atorvastatin (LIPITOR) 20 MG tablet Take 1 tablet (20 mg total) by mouth daily. Patient taking differently: Take 20 mg by mouth at bedtime. 05/20/22   [name], MD  Calcium Carbonate (CALCIUM 500 PO) Take 500 mg by mouth daily.    [provider]  diltiazem (CARDIZEM CD) 240 MG 24 hr capsule TAKE 1 CAPSULE BY MOUTH EVERY DAY 10/26/22   [name], MD  diphenhydrAMINE (BENADRYL) 25 mg capsule Take 25 mg by mouth at bedtime.    [provider]  ELIQUIS 5 MG TABS tablet TAKE 1 TABLET BY MOUTH TWICE A DAY 11/22/22   [name], MD  hydrochlorothiazide (HYDRODIURIL) 25 MG tablet Take 1 tablet (25 mg total) by mouth daily. 05/20/22   [name], MD  levothyroxine (SYNTHROID) 75 MCG tablet Take 1 tablet (75 mcg total) by mouth daily.  05/20/22   [name], MD  Multiple Vitamins-Minerals (MULTIVITAMIN ADULT) TABS Take 1 tablet by mouth daily. 03/26/19   [name], MD  pantoprazole (PROTONIX) 40 MG tablet Take 1 tablet (40 mg total) by mouth daily. Patient taking differently: Take 40 mg by mouth at bedtime. 05/20/22   [name], MD  potassium gluconate 595 (99 K) MG TABS tablet Take 595 mg by mouth in the morning.    [provider]  valsartan (DIOVAN) 320 MG tablet Take 1 tablet (320 mg total) by mouth daily. 05/20/22   [name], MD    Current Outpatient Medications  Medication Sig Dispense Refill   Ascorbic Acid (VITAMIN C GUMMIE) 120 MG CHEW Chew 250 mg by mouth daily.     atorvastatin (LIPITOR) 20 MG tablet Take 1 tablet (20 mg total) by mouth daily. (Patient taking differently: Take 20 mg by mouth at bedtime.) 90 tablet 4   Calcium Carbonate (CALCIUM 500 PO) Take 500 mg by mouth daily.     diltiazem (CARDIZEM CD) 240 MG 24 hr capsule TAKE 1 CAPSULE BY MOUTH EVERY DAY 90 capsule 1   diphenhydrAMINE (BENADRYL) 25 mg capsule Take 25 mg by mouth at bedtime.     ELIQUIS 5 MG TABS tablet TAKE 1 TABLET BY MOUTH TWICE A DAY 60 tablet 5   hydrochlorothiazide (HYDRODIURIL) 25 MG tablet Take 1 tablet (25 mg total) by mouth daily. 90 tablet 4   levothyroxine (SYNTHROID) 75 MCG tablet Take 1 tablet (75 mcg total) by mouth daily. 90 tablet 4  Multiple Vitamins-Minerals (MULTIVITAMIN ADULT) TABS Take 1 tablet by mouth daily.     pantoprazole (PROTONIX) 40 MG tablet Take 1 tablet (40 mg total) by mouth daily. (Patient taking differently: Take 40 mg by mouth at bedtime.) 90 tablet 4   potassium gluconate 595 (99 K) MG TABS tablet Take 595 mg by mouth in the morning.     valsartan (DIOVAN) 320 MG tablet Take 1 tablet (320 mg total) by mouth daily. 90 tablet 4   No current facility-administered medications for this visit.    No Known Allergies    Review of Systems:   General:  normal  appetite, + decreased energy, no weight gain, no weight loss, no fever  Cardiac:  no chest pain with exertion, no chest pain at rest, +SOB with moderate exertion, no resting SOB, no PND, no orthopnea, no palpitations, + arrhythmia, + atrial fibrillation, no LE edema, + dizzy spells, no syncope  Respiratory:  + exertional shortness of breath, no home oxygen, no productive cough, no dry cough, no bronchitis, no wheezing, no hemoptysis, no asthma, no pain with inspiration or cough, no sleep apnea, no CPAP at night  GI:   no difficulty swallowing, no reflux, no frequent heartburn, no hiatal hernia, no abdominal pain, no constipation, no diarrhea, no hematochezia, no hematemesis, no melena  GU:   no dysuria,  no frequency, no urinary tract infection, no hematuria, no kidney stones, no kidney disease  Vascular:  no pain suggestive of claudication, no pain in feet, no leg cramps, no varicose veins, no DVT, no non-healing foot ulcer  Neuro:   no stroke, no TIA's, no seizures, no headaches, no temporary blindness one eye,  no slurred speech, no peripheral neuropathy, no chronic pain, no instability of gait, no memory/cognitive dysfunction  Musculoskeletal: no arthritis, no joint swelling, no myalgias, no difficulty walking, normal mobility   Skin:   no rash, no itching, no skin infections, no pressure sores or ulcerations  Psych:   no anxiety, no depression, no nervousness, no unusual recent stress  Eyes:   no blurry vision, no floaters, no recent vision changes, does not wear glasses or contacts  ENT:   no hearing loss, no loose or painful teeth, no dentures, last saw dentist last year  Hematologic:  no easy bruising, no abnormal bleeding, no clotting disorder, no frequent epistaxis  Endocrine:  no diabetes, does not check CBG's at home     Physical Exam:   BP (!) 150/85   Pulse 94   Resp 20   Ht 5\' 6"  (1.676 m)   Wt 161 lb (73 kg)   SpO2 96% Comment: RA  BMI 25.99 kg/m   General:  Looks younger  than stated age,  well-appearing  HEENT:  Unremarkable, NCAT, PERLA, EOMI  Neck:   no JVD, no bruits, no adenopathy   Chest:   clear to auscultation, symmetrical breath sounds, no wheezes, no rhonchi   CV:   RRR, 3/6 systolic murmur RSB, no diastolic murmur  Abdomen:  soft, non-tender, no masses   Extremities:  warm, well-perfused, pedal pulses palpable, no lower extremity edema  Rectal/GU  Deferred  Neuro:   Grossly non-focal and symmetrical throughout  Skin:   Clean and dry, no rashes, no breakdown  Diagnostic Tests:    ECHOCARDIOGRAM REPORT       Patient Name:   DORISMAR BASSE Date of Exam: 02/21/2023  Medical Rec #:  [id number]         Height:  66.0 in  Accession #:    [id number]        Weight:       163.6 lb  Date of Birth:  08/12/1932         BSA:          1.836 m  Patient Age:    89 years          BP:           146/84 mmHg  Patient Gender: F                 HR:           95 bpm.  Exam Location:  [address]   Procedure: 2D Echo, Color Doppler, Cardiac Doppler and 3D Echo   Indications:    Aortic Stenosis I35.0    History:        Patient has prior history of Echocardiogram examinations,  most                 recent 02/18/2022. Arrythmias:Atrial Fibrillation; Risk                  Factors:Hypertension and Dyslipidemia.    Sonographer:    Thurman Coyer RDCS  Referring Phys: [phone] MICHAEL COOPER   IMPRESSIONS     1. Left ventricular ejection fraction, by estimation, is 55 to 60%. The  left ventricle has normal function. The left ventricle has no regional  wall motion abnormalities. Left ventricular diastolic function could not  be evaluated.   2. Right ventricular systolic function is normal. The right ventricular  size is normal. There is normal pulmonary artery systolic pressure.   3. The mitral valve is myxomatous. Moderate mitral valve regurgitation.  No evidence of mitral stenosis. Moderate mitral annular calcification.   4. Tricuspid valve  regurgitation is moderate.   5. The aortic valve is calcified. Aortic valve regurgitation is not  visualized. Suspect paradoxical low flow low gradient severe aortic  stenosis. Aortic valve area, by VTI measures 0.61 cm. Aortic valve mean  gradient measures 25.0 mmHg. Aortic valve  Vmax measures 3.20 m/s, Indexed AVA 0.33 cm2/m2, SVI 24 and DI 0.15.   6. The inferior vena cava is normal in size with greater than 50%  respiratory variability, suggesting right atrial pressure of 3 mmHg.   7. Right atrial size was mildly dilated.   FINDINGS   Left Ventricle: Left ventricular ejection fraction, by estimation, is 55  to 60%. The left ventricle has normal function. The left ventricle has no  regional wall motion abnormalities. The left ventricular internal cavity  size was normal in size. There is   no left ventricular hypertrophy. Left ventricular diastolic function  could not be evaluated due to mitral annular calcification (moderate or  greater). Left ventricular diastolic function could not be evaluated.   Right Ventricle: The right ventricular size is normal. No increase in  right ventricular wall thickness. Right ventricular systolic function is  normal. There is normal pulmonary artery systolic pressure. The tricuspid  regurgitant velocity is 2.61 m/s, and   with an assumed right atrial pressure of 3 mmHg, the estimated right  ventricular systolic pressure is 30.2 mmHg.   Left Atrium: Left atrial size was normal in size.   Right Atrium: Right atrial size was mildly dilated.   Pericardium: There is no evidence of pericardial effusion. Presence of  epicardial fat layer.   Mitral Valve: The mitral valve is myxomatous. Moderate mitral annular  calcification. Moderate  mitral valve regurgitation. No evidence of mitral  valve stenosis.   Tricuspid Valve: The tricuspid valve is normal in structure. Tricuspid  valve regurgitation is moderate . No evidence of tricuspid stenosis.    Aortic Valve: The aortic valve is calcified. Aortic valve regurgitation is  not visualized. Suspect paradoxical low flow low gradient aortic stenosis.  Aortic valve mean gradient measures 25.0 mmHg. Aortic valve peak gradient  measures 41.0 mmHg. Aortic  valve area, by VTI measures 0.61 cm.   Pulmonic Valve: The pulmonic valve was normal in structure. Pulmonic valve  regurgitation is not visualized. No evidence of pulmonic stenosis.   Aorta: The aortic root is normal in size and structure.   Venous: The inferior vena cava is normal in size with greater than 50%  respiratory variability, suggesting right atrial pressure of 3 mmHg.   IAS/Shunts: No atrial level shunt detected by color flow Doppler.     LEFT VENTRICLE  PLAX 2D  LVIDd:         3.60 cm   Diastology  LVIDs:         2.30 cm   LV e' medial: 5.27 cm/s  LV PW:         1.00 cm  LV IVS:        1.00 cm  LVOT diam:     2.25 cm  LV SV:         44  LV SV Index:   24        3D Volume EF:  LVOT Area:     3.98 cm  3D EF:        52 %                           LV EDV:       71 ml                           LV ESV:       34 ml                           LV SV:        37 ml   RIGHT VENTRICLE  RV Basal diam:  3.30 cm  RV Mid diam:    2.60 cm  RV S prime:     11.30 cm/s  TAPSE (M-mode): 1.5 cm   LEFT ATRIUM             Index        RIGHT ATRIUM           Index  LA diam:        3.70 cm 2.02 cm/m   RA Area:     17.10 cm  LA Vol (A2C):   58.3 ml 31.75 ml/m  RA Volume:   49.70 ml  27.07 ml/m  LA Vol (A4C):   31.4 ml 17.10 ml/m  LA Biplane Vol: 47.5 ml 25.87 ml/m   AORTIC VALVE  AV Area (Vmax):    0.69 cm  AV Area (Vmean):   0.63 cm  AV Area (VTI):     0.61 cm  AV Vmax:           320.00 cm/s  AV Vmean:          235.000 cm/s  AV VTI:            0.724  m  AV Peak Grad:      41.0 mmHg  AV Mean Grad:      25.0 mmHg  LVOT Vmax:         55.55 cm/s  LVOT Vmean:        37.150 cm/s  LVOT VTI:          0.112 m  LVOT/AV VTI  ratio: 0.15    AORTA  Ao Root diam: 3.20 cm  Ao Asc diam:  2.90 cm   TRICUSPID VALVE  TR Peak grad:   27.2 mmHg  TR Vmax:        261.00 cm/s    SHUNTS  Systemic VTI:  0.11 m  Systemic Diam: 2.25 cm   Lavona Mound Tobb DO  Electronically signed by Thomasene Ripple DO  Signature Date/Time: 02/21/2023/12:59:07 PM        Final      hysicians  Panel Physicians Referring Physician Case Authorizing Physician  [name], MD (Primary)     Procedures  RIGHT/LEFT HEART CATH AND CORONARY ANGIOGRAPHY   Conclusion  1.  Patent coronary arteries with mild nonobstructive plaquing in the mid RCA, minimal irregularities in the other vessels, no significant stenoses throughout the coronary tree 2.  Known severe paradoxical low-flow low gradient aortic stenosis by noninvasive assessment, by catheter measurement, the mean transvalvular gradient is 22 mmHg with a calculated aortic valve area of 1.2 cm 3.  Normal right heart hemodynamics and preserved cardiac output of 5.2 L/min   Recommendations: Continue TAVR evaluation   Indications  Severe aortic stenosis [I35.0 (ICD-10-CM)]   Procedural Details  Technical Details INDICATION: Severe paradoxical low-flow low gradient aortic stenosis  PROCEDURAL DETAILS: The right wrist is prepped, draped, and anesthetized with 1% lidocaine. Using the modified Seldinger technique, a 5/6 French Slender sheath is introduced into the right radial artery. 3 mg of verapamil is administered through the sheath, weight-based unfractionated heparin was administered intravenously. Standard Judkins catheters are used for selective coronary angiography. LV pressure is recorded and an aortic valve pullback gradient is measured.  Catheter exchanges are performed over an exchange length guidewire. There are no immediate procedural complications. A TR band is used for radial hemostasis at the completion of the procedure.  The patient was transferred to the post catheterization  recovery area for further monitoring.      Estimated blood loss <50 mL.   During this procedure medications were administered to achieve and maintain moderate conscious sedation while the patient's heart rate, blood pressure, and oxygen saturation were continuously monitored and I was present face-to-face 100% of this time.   Medications (Filter: Administrations occurring from 1224 to 1317 on 03/30/23) Heparin (Porcine) in NaCl 1000-0.9 UT/500ML-% SOLN (mL)  Total volume: 1,000 mL Date/Time Rate/Dose/Volume Action   03/30/23 1238 500 mL Given   1239 500 mL Given   fentaNYL (SUBLIMAZE) injection (mcg)  Total dose: 25 mcg Date/Time Rate/Dose/Volume Action   03/30/23 1239 25 mcg Given   midazolam (VERSED) injection (mg)  Total dose: 2 mg Date/Time Rate/Dose/Volume Action   03/30/23 1239 2 mg Given   lidocaine (PF) (XYLOCAINE) 1 % injection (mL)  Total volume: 4 mL Date/Time Rate/Dose/Volume Action   03/30/23 1246 2 mL Given   1247 2 mL Given   Radial Cocktail/Verapamil only (mL)  Total volume: 10 mL Date/Time Rate/Dose/Volume Action   03/30/23 1248 10 mL Given   heparin sodium (porcine) injection (Units)  Total dose: 4,000 Units Date/Time Rate/Dose/Volume Action   03/30/23 1255 4,000 Units Given  iohexol (OMNIPAQUE) 350 MG/ML injection (mL)  Total volume: 40 mL Date/Time Rate/Dose/Volume Action   03/30/23 1302 40 mL Given    Sedation Time  Sedation Time Physician-1: 20 minutes 58 seconds Contrast     Administrations occurring from 1224 to 1317 on 03/30/23:  Medication Name Total Dose  iohexol (OMNIPAQUE) 350 MG/ML injection 40 mL   Radiation/Fluoro  Fluoro time: 2.4 (min) DAP: 4.6 (Gycm2) Cumulative Air Kerma: 82.1 (mGy) Complications  Complications documented before study signed (03/30/2023  1:17 PM)   No complications were associated with this study.  Documented by London Sheer T - 03/30/2023  1:02 PM     Coronary Findings  Diagnostic Dominance:  Right Left Main  The vessel exhibits minimal luminal irregularities. The left main is patent with no stenosis. The vessel divides into the LAD and circumflex.    Left Anterior Descending  The vessel exhibits minimal luminal irregularities. The LAD is patent to the apex without significant stenosis. The diagonal branches are patent with no significant stenoses.    Left Circumflex  The vessel exhibits minimal luminal irregularities. Circumflex is patent with mild irregularity, 2 patent OM branches without significant stenosis.    Right Coronary Artery  There is mild diffuse disease throughout the vessel. Patent RCA, dominant vessel, mild nonobstructive mid vessel plaquing noted. No significant stenosis throughout.  Mid RCA lesion is 30% stenosed.    Intervention   No interventions have been documented.   Left Heart  Aortic Valve There is moderate aortic valve stenosis. The aortic valve is calcified. There is restricted aortic valve motion.   Coronary Diagrams  Diagnostic Dominance: Right  Intervention   Implants   No implant documentation for this case.   Syngo Images   Show images for CARDIAC CATHETERIZATION Images on Long Term Storage   Show images for Sreenidhi, Maraldo to Procedure Log  Procedure Log   Link to Procedure Log  Procedure Log    Hemo Data  Flowsheet Row Most Recent Value  Fick Cardiac Output 5.21 L/min  Fick Cardiac Output Index 2.86 (L/min)/BSA  Aortic Mean Gradient 21.79 mmHg  Aortic Peak Gradient 24.4 mmHg  Aortic Valve Area 1.22  Aortic Value Area Index 0.67 cm2/BSA  RA A Wave 6 mmHg  RA V Wave 6 mmHg  RA Mean 4 mmHg  RV Systolic Pressure 33 mmHg  RV Diastolic Pressure -4 mmHg  RV EDP 5 mmHg  PA Systolic Pressure 33 mmHg  PA Diastolic Pressure 12 mmHg  PA Mean 20 mmHg  PW A Wave 12 mmHg  PW V Wave 23 mmHg  PW Mean 12 mmHg  AO Systolic Pressure 116 mmHg  AO Diastolic Pressure 59 mmHg  AO Mean 82 mmHg  LV Systolic Pressure  170 mmHg  LV Diastolic Pressure -5 mmHg  LV EDP 16 mmHg  AOp Systolic Pressure 144 mmHg  AOp Diastolic Pressure 76 mmHg  AOp Mean Pressure 105 mmHg  LVp Systolic Pressure 167 mmHg  LVp Diastolic Pressure -6 mmHg  LVp EDP Pressure 16 mmHg  QP/QS 1  TPVR Index 7 HRUI  TSVR Index 34.67 HRUI  PVR SVR Ratio 0.08  TPVR/TSVR Ratio 0.2    ADDENDUM REPORT: 04/03/2023 14:54   EXAM: OVER-READ INTERPRETATION  CT CHEST   The following report is an over-read performed by radiologist Dr. Jacob Moores [facility] on 04/03/2023. This over-read does not include interpretation of cardiac or coronary anatomy or pathology. The cardiac TAVR interpretation by the cardiologist is attached.   COMPARISON:  None.   FINDINGS: Extracardiac findings will be described separately under dictation for contemporaneously obtained CTA chest, abdomen and pelvis.   IMPRESSION: Please see separate dictation for contemporaneously obtained CTA chest, abdomen and pelvis dated 04/03/2023 for full description of relevant extracardiac findings.     Electronically Signed   By: Allegra Lai M.D.   On: 04/03/2023 14:54    Addended by Renford Dills, MD on 04/03/2023  2:56 PM    Study Result  Narrative & Impression  CLINICAL DATA:  Aortic Valve pathology with assessment for TAVR   EXAM: Cardiac TAVR CT   TECHNIQUE: The patient was scanned on a Siemens Force 192 slice scanner. A 120 kV retrospective scan was triggered in the descending thoracic aorta at 111 HU's. Gantry rotation speed was 270 msecs and collimation was .9 mm. No beta blockade or nitro were given. The 3D data set was reconstructed in 5% intervals of the R-R cycle. Systolic and diastolic phases were analyzed on a dedicated work station using MPR, MIP and VRT modes. The patient received 95 cc of contrast.   FINDINGS: Aortic Valve: Severely thickened aortic valve with heavy calcification and reduced excursion the  planimeter valve area is 0.986 Sq cm consistent with severe aortic stenosis   Number of leaflets: 3   Annular calcification: Technically severe- two small calcified nodules but presence of LVOT calcification   Aortic Valve Calcium Score: 2242   Presence of basal septal hypertrophy: No   Perimembranous septal diameter: 5 mm   Mitral Valve: Moderate mitral annular calcification. Bi-leaflet prolapse. Mitral annular disjunction noted, disjunction distance 4 mm.   Aortic Annulus Measurements- 20% Phase   Major annulus diameter: 27 mm   Minor annulus diameter: 22 mm   Annular perimeter: 77 mm   Annular area: 4.56 cm2   Aortic Root Measurements   Sinotubular Junction: 29 mm   Ascending Thoracic Aorta: 31 mm   Aortic Arch: 27 mm   Descending Thoracic Aorta: 24 mm   Aortic atherosclerosis.   Sinus of Valsalva Measurements:   Right coronary cusp width: 31 mm   Left coronary cusp width: 31 mm   Non coronary cusp width: 31 mm   Coronary Artery Height above Annulus:   Left Main: 19 mm   Left SoV height: 22 mm   Right Coronary: 17 mm   Right SoV height: 22 mm   Optimum Fluoroscopic Angle for Delivery: LAO 17, CAU 16   Valves for structural team consideration: 26 mm Sapien Valve, 29 mm Evolut Valve   Non TAVR Valve Findings:   Coronary Arteries: Normal coronary origin. Study not completed with nitroglycerin.   Coronary Calcium Score:   Left main: 16   Left anterior descending artery: 43   Left circumflex artery: 8   Right coronary artery: 58   Total: 125   Systemic veins:Normal anatomy   Main Pulmonary artery: Normal caliber   Pulmonary veins: Normal anatomy.   Left atrial appendage: Patent   Interatrial septum: No communications.   Left ventricle:  Normal   Left atrium: Dilated   Right ventricle: Dilated   Right atrium: Dilated   Pericardium: No calcifications   Extra Cardiac Findings as per separate reporting.   IMPRESSION: 1.  Severe Aortic stenosis. Findings pertinent to TAVR procedure are detailed above.   RECOMMENDATIONS:   The proposed cut-off value of 1,651 AU yielded a 93 % sensitivity and 75 % specificity in grading AS severity in patients with classical low-flow, low-gradient AS. Proposed different cut-off  values to define severe AS for men and women as 2,065 AU and 1,274 AU, respectively. The joint European and American recommendations for the assessment of AS consider the aortic valve calcium score as a continuum - a very high calcium score suggests severe AS and a low calcium score suggests severe AS is unlikely.   Sunday Shams, et al. 2017 ESC/EACTS Guidelines for the management of valvular heart disease. Eur Heart J [phone]   Coronary artery calcium (CAC) score is a strong predictor of incident coronary heart disease (CHD) and provides predictive information beyond traditional risk factors. CAC scoring is reasonable to use in the decision to withhold, postpone, or initiate statin therapy in intermediate-risk or selected borderline-risk asymptomatic adults (age 11-75 years and LDL-C >=70 to <190 mg/dL) who do not have diabetes or established atherosclerotic cardiovascular disease (ASCVD).* In intermediate-risk (10-year ASCVD risk >=7.5% to <20%) adults or selected borderline-risk (10-year ASCVD risk >=5% to <7.5%) adults in whom a CAC score is measured for the purpose of making a treatment decision the following recommendations have been made:   If CAC = 0, it is reasonable to withhold statin therapy and reassess in 5 to 10 years, as long as higher risk conditions are absent (diabetes mellitus, family history of premature CHD in first degree relatives (males <55 years; females <65 years), cigarette smoking, LDL >=190 mg/dL or other independent risk factors).   If CAC is 1 to 99, it is reasonable to initiate statin therapy for patients >=48 years of age.   If CAC  is >=100 or >=75th percentile, it is reasonable to initiate statin therapy at any age.   Cardiology referral should be considered for patients with CAC scores >=400 or >=75th percentile.   *2018 AHA/ACC/AACVPR/AAPA/ABC/ACPM/ADA/AGS/APhA/ASPC/NLA/PCNA Guideline on the Management of Blood Cholesterol: A Report of the American College of Cardiology/American Heart Association Task Force on Clinical Practice Guidelines. J Am Coll Cardiol. 2019;73(24):3168-3209.   Mahesh  Chandrasekhar   Electronically Signed: By: Riley Lam M.D. On: 04/03/2023 14:23      Narrative & Impression  CLINICAL DATA:  Aortic valve replacement preop evaluation   EXAM: CT ANGIOGRAPHY CHEST, ABDOMEN AND PELVIS   TECHNIQUE: Multidetector CT imaging through the chest, abdomen and pelvis was performed using the standard protocol during bolus administration of intravenous contrast. Multiplanar reconstructed images and MIPs were obtained and reviewed to evaluate the vascular anatomy.   RADIATION DOSE REDUCTION: This exam was performed according to the departmental dose-optimization program which includes automated exposure control, adjustment of the mA and/or kV according to patient size and/or use of iterative reconstruction technique.   CONTRAST:  95mL OMNIPAQUE IOHEXOL 350 MG/ML SOLN   COMPARISON:  Abdomen CT dated December 05, 2019   FINDINGS: CTA CHEST FINDINGS   Cardiovascular: Cardiomegaly. Aortic valve thickening and calcifications. Normal caliber thoracic aorta with moderate atherosclerotic disease. Left main and three-vessel coronary artery calcifications.   Mediastinum/Nodes: Small hiatal hernia. Thyroid is unremarkable. No enlarged lymph nodes seen in the chest.   Lungs/Pleura: Central airways are patent. Biapical pleural-parenchymal scarring with calcifications. No consolidation, pleural effusion or pneumothorax. Small bilateral solid pulmonary nodules. Reference left lower lobe  3 minimal meter nodule located on series 9, image 76. Reference right middle lobe pulmonary nodule measuring 2 mm on series 9, image 65.   Musculoskeletal: No chest wall abnormality. No acute or significant osseous findings.   CTA ABDOMEN AND PELVIS FINDINGS   Hepatobiliary: No focal liver abnormality is seen. Focal wall thickening  of the neck of the gallbladder. No biliary ductal dilation.   Pancreas: Unremarkable. No pancreatic ductal dilatation or surrounding inflammatory changes.   Spleen: Normal in size without focal abnormality.   Adrenals/Urinary Tract: Bilateral adrenal glands are unremarkable. Prominent bilateral extrarenal pelvises, unchanged when compared with the prior. No hydronephrosis or nephrolithiasis.   Stomach/Bowel: Stomach is within normal limits. Appendix appears normal. No evidence of bowel wall thickening, distention, or inflammatory changes.   Vascular/lymphatic: Caliber thoracic aorta with severe atherosclerotic disease. No enlarged lymph nodes seen in the abdomen or pelvis.   Reproductive: No adnexal masses.   Other: No abdominal wall hernia or abnormality. No abdominopelvic ascites.   Musculoskeletal: No acute or significant osseous findings.   VASCULAR MEASUREMENTS PERTINENT TO TAVR:   AORTA:   Minimal Aortic Diameter -  10.3 mm   Severity of Aortic Calcification-severe   RIGHT PELVIS:   Right Common Iliac Artery -   Minimal Diameter-5.2 mm   Tortuosity-moderate   Calcification-severe   Right External Iliac Artery -   Minimal Diameter-6.6 mm   Tortuosity-mild   Calcification-none   Right Common Femoral Artery -   Minimal Diameter-5.6 mm   Tortuosity-mild   Calcification-moderate   LEFT PELVIS:   Left Common Iliac Artery -   Minimal Diameter-6.4 mm   Tortuosity-mild   Calcification-severe   Left External Iliac Artery -   Minimal Diameter-7.3 mm   Tortuosity-mild   Calcification-none   Left Common  Femoral Artery -   Minimal Diameter-5.8 mm   Tortuosity-mild   Calcification-severe   Review of the MIP images confirms the above findings.   IMPRESSION: Vascular:   1. Vascular findings and measurements pertinent to potential TAVR procedure, as detailed above. 2. Thickening and calcification of the aortic valve, compatible with reported clinical history of aortic stenosis. 3. Severe aortoiliac atherosclerosis. Left main and 3 vessel coronary artery disease.   Nonvascular:   1. Focal wall thickening of the neck of the gallbladder, could be due to adenomyomatosis although neoplasm can not be excluded, however recommend further evaluation with right upper quadrant ultrasound. 2. Small bilateral solid pulmonary nodules measuring up to 3 mm. No follow-up needed if patient is low-risk.This recommendation follows the consensus statement: Guidelines for Management of Incidental Pulmonary Nodules Detected on CT Images: From the Fleischner Society 2017; Radiology 2017; 284:228-243.     Electronically Signed   By: Allegra Lai M.D.   On: 04/03/2023 13:58      Impression:  This 89 year old woman has stage D3, severe, symptomatic low-flow/low gradient aortic stenosis with NYHA class II symptoms of progressive exertional fatigue and shortness of breath as well as lightheadedness consistent with chronic diastolic congestive heart failure.  I have personally reviewed her 2D echocardiogram, cardiac catheterization, and CTA studies.  Her echocardiogram shows a severely thickened and calcified aortic valve with a mean gradient of 25 mmHg and a valve area of 0.61 cm by VTI.  Left ventricular ejection fraction is normal with a low stroke-volume index of 24 and dimensionless index of 0.15 consistent with low-flow/low gradient severe aortic stenosis.  Her cardiac catheterization shows mild nonobstructive disease.  I agree that aortic valve replacement is indicated in this active patient for  relief of her symptoms to improve her quality of life and to prevent progressive left ventricular deterioration.  Given her advanced age I think transcatheter aortic valve replacement would be the best treatment for her.  Her gated cardiac CTA shows anatomy suitable for TAVR using a 26 mm SAPIEN 3  valve.  Her abdominal and pelvic CTA shows adequate pelvic vascular anatomy to allow transfemoral insertion.  The patient and her family were counseled at length regarding treatment alternatives for management of severe symptomatic aortic stenosis. The risks and benefits of surgical intervention has been discussed in detail. Long-term prognosis with medical therapy was discussed. Alternative approaches such as conventional surgical aortic valve replacement, transcatheter aortic valve replacement, and palliative medical therapy were compared and contrasted at length. This discussion was placed in the context of the patient's own specific clinical presentation and past medical history. All of their questions have been addressed.   Following the decision to proceed with transcatheter aortic valve replacement, a discussion was held regarding what types of management strategies would be attempted intraoperatively in the event of life-threatening complications, including whether or not the patient would be considered a candidate for the use of cardiopulmonary bypass and/or conversion to open sternotomy for attempted surgical intervention.  She is still very active and independent at 89 years old and I think she would be a candidate for emergent sternotomy to manage any intraoperative complications.  The patient has been advised of a variety of complications that might develop including but not limited to risks of death, stroke, paravalvular leak, aortic dissection or other major vascular complications, aortic annulus rupture, device embolization, cardiac rupture or perforation, mitral regurgitation, acute myocardial  infarction, arrhythmia, heart block or bradycardia requiring permanent pacemaker placement, congestive heart failure, respiratory failure, renal failure, pneumonia, infection, other late complications related to structural valve deterioration or migration, or other complications that might ultimately cause a temporary or permanent loss of functional independence or other long term morbidity. The patient provides full informed consent for the procedure as described and all questions were answered.      Plan:  He will be scheduled for transfemoral TAVR using a SAPIEN 3 valve on 04/18/2023.  She will need to stop her Eliquis 5 days preoperatively and we will need to discuss the need for a Lovenox bridge.  I spent 60 minutes performing this consultation and > 50% of this time was spent face to face counseling and coordinating the care of this patient's severe symptomatic aortic stenosis.   Alleen Borne, MD 04/05/2023

## 2023-04-07 ENCOUNTER — Other Ambulatory Visit: Payer: Self-pay

## 2023-04-07 DIAGNOSIS — I350 Nonrheumatic aortic (valve) stenosis: Secondary | ICD-10-CM

## 2023-04-12 ENCOUNTER — Telehealth: Payer: Self-pay

## 2023-04-12 NOTE — Telephone Encounter (Signed)
Per Dr Earmon Phoenix 10/14 OV note: She has no dental problems and sees a dentist regularly.   The pt went in to see her dentist, Dr Vanessa Barbara, for routine evaluation prior to TAVR.  Per Dr Vanessa Barbara the patient has a number of broken teeth but no pain or infection.  She does feel like the patient will need dental treatment in the future.  I made her aware that typically after TAVR we have pt's hold off on dental work for 4-6 weeks and she will require SBE after TAVR. Our team can address clearance to proceed with dental treatment in the future.

## 2023-04-14 ENCOUNTER — Other Ambulatory Visit: Payer: Self-pay

## 2023-04-14 ENCOUNTER — Encounter (HOSPITAL_COMMUNITY)
Admission: RE | Admit: 2023-04-14 | Discharge: 2023-04-14 | Disposition: A | Discharge status: Home / Self Care | Payer: Medicare Other | Source: Ambulatory Visit | Attending: Cardiovascular Disease | Admitting: Cardiovascular Disease

## 2023-04-14 ENCOUNTER — Ambulatory Visit (HOSPITAL_COMMUNITY)
Admission: RE | Admit: 2023-04-14 | Discharge: 2023-04-14 | Disposition: A | Discharge status: Home / Self Care | Payer: Medicare Other | Source: Ambulatory Visit | Attending: Cardiovascular Disease | Admitting: Cardiovascular Disease

## 2023-04-14 DIAGNOSIS — I350 Nonrheumatic aortic (valve) stenosis: Secondary | ICD-10-CM | POA: Diagnosis not present

## 2023-04-14 DIAGNOSIS — Z1152 Encounter for screening for COVID-19: Secondary | ICD-10-CM | POA: Diagnosis not present

## 2023-04-14 DIAGNOSIS — Z01818 Encounter for other preprocedural examination: Secondary | ICD-10-CM | POA: Diagnosis not present

## 2023-04-14 DIAGNOSIS — I517 Cardiomegaly: Secondary | ICD-10-CM | POA: Diagnosis not present

## 2023-04-14 DIAGNOSIS — I700 Atherosclerosis of aorta: Secondary | ICD-10-CM | POA: Diagnosis not present

## 2023-04-14 LAB — URINALYSIS, ROUTINE W REFLEX MICROSCOPIC
Bilirubin Urine: NEGATIVE
Glucose, UA: NEGATIVE mg/dL
Hgb urine dipstick: NEGATIVE
Ketones, ur: NEGATIVE mg/dL
Nitrite: NEGATIVE
Protein, ur: NEGATIVE mg/dL
Specific Gravity, Urine: 1.014 (ref 1.005–1.030)
pH: 7 (ref 5.0–8.0)

## 2023-04-14 LAB — CBC
HCT: 40.7 % (ref 36.0–46.0)
Hemoglobin: 13.5 g/dL (ref 12.0–15.0)
MCH: 33.6 pg (ref 26.0–34.0)
MCHC: 33.2 g/dL (ref 30.0–36.0)
MCV: 101.2 fL — ABNORMAL HIGH (ref 80.0–100.0)
Platelets: 260 10*3/uL (ref 150–400)
RBC: 4.02 MIL/uL (ref 3.87–5.11)
RDW: 12.1 % (ref 11.5–15.5)
WBC: 6.8 10*3/uL (ref 4.0–10.5)
nRBC: 0 % (ref 0.0–0.2)

## 2023-04-14 LAB — COMPREHENSIVE METABOLIC PANEL
ALT: 16 U/L (ref 0–44)
AST: 18 U/L (ref 15–41)
Albumin: 4.2 g/dL (ref 3.5–5.0)
Alkaline Phosphatase: 61 U/L (ref 38–126)
Anion gap: 11 (ref 5–15)
BUN: 25 mg/dL — ABNORMAL HIGH (ref 8–23)
CO2: 26 mmol/L (ref 22–32)
Calcium: 9.6 mg/dL (ref 8.9–10.3)
Chloride: 101 mmol/L (ref 98–111)
Creatinine, Ser: 1.35 mg/dL — ABNORMAL HIGH (ref 0.44–1.00)
GFR, Estimated: 38 mL/min — ABNORMAL LOW (ref >60)
Glucose, Bld: 115 mg/dL — ABNORMAL HIGH (ref 70–99)
Potassium: 4.4 mmol/L (ref 3.5–5.1)
Sodium: 138 mmol/L (ref 135–145)
Total Bilirubin: 1.3 mg/dL — ABNORMAL HIGH (ref <1.2)
Total Protein: 7.5 g/dL (ref 6.5–8.1)

## 2023-04-14 LAB — TYPE AND SCREEN
ABO/RH(D): O POS
Antibody Screen: NEGATIVE

## 2023-04-14 LAB — PROTIME-INR
INR: 1.1 (ref 0.8–1.2)
Prothrombin Time: 13.9 s (ref 11.4–15.2)

## 2023-04-14 LAB — SURGICAL PCR SCREEN
MRSA, PCR: NEGATIVE
Staphylococcus aureus: NEGATIVE

## 2023-04-14 NOTE — Progress Notes (Signed)
Patient signed all consents at PAT lab appointment. CHG soap and instructions were given to patient. CHG surgical prep reviewed with patient and all questions answered.  Patients chart send to anesthesia for review. Pt denies any respiratory illness/infection in the last two months.

## 2023-04-15 LAB — SARS CORONAVIRUS 2 (TAT 6-24 HRS): SARS Coronavirus 2: NEGATIVE

## 2023-04-17 MED ORDER — HEPARIN 30,000 UNITS/1000 ML (OHS) CELLSAVER SOLUTION
Status: DC
  Filled 2023-04-17: qty 1000

## 2023-04-17 MED ORDER — POTASSIUM CHLORIDE 2 MEQ/ML IV SOLN
80.0000 meq | INTRAVENOUS | Status: DC
  Filled 2023-04-17: qty 40

## 2023-04-17 MED ORDER — CEFAZOLIN SODIUM-DEXTROSE 2-4 GM/100ML-% IV SOLN
2.0000 g | INTRAVENOUS | Status: AC
Start: 2023-04-18 — End: 2023-04-19
  Administered 2023-04-18: 2 g via INTRAVENOUS
  Filled 2023-04-17: qty 100

## 2023-04-17 MED ORDER — NOREPINEPHRINE 4 MG/250ML-% IV SOLN
0.0000 ug/min | INTRAVENOUS | Status: DC
  Filled 2023-04-17: qty 250

## 2023-04-17 MED ORDER — MAGNESIUM SULFATE 50 % IJ SOLN
40.0000 meq | INTRAMUSCULAR | Status: DC
  Filled 2023-04-17: qty 9.85

## 2023-04-17 MED ORDER — DEXMEDETOMIDINE HCL IN NACL 400 MCG/100ML IV SOLN
0.1000 ug/kg/h | INTRAVENOUS | Status: AC
  Administered 2023-04-18 (×3): 4 ug via INTRAVENOUS
  Filled 2023-04-17: qty 100

## 2023-04-17 NOTE — Anesthesia Preprocedure Evaluation (Addendum)
Anesthesia Evaluation  Patient identified by MRN, date of birth, ID band Patient awake    Reviewed: Allergy & Precautions, NPO status , Patient's Chart, lab work & pertinent test results  Airway Mallampati: II  TM Distance: >3 FB Neck ROM: Full    Dental no notable dental hx.    Pulmonary neg pulmonary ROS   Pulmonary exam normal        Cardiovascular hypertension, Pt. on medications + Valvular Problems/Murmurs AS  Rhythm:Regular Rate:Normal + Systolic murmurs ECHO:  1. Left ventricular ejection fraction, by estimation, is 55 to 60%. The  left ventricle has normal function. The left ventricle has no regional  wall motion abnormalities. Left ventricular diastolic function could not  be evaluated.   2. Right ventricular systolic function is normal. The right ventricular  size is normal. There is normal pulmonary artery systolic pressure.   3. The mitral valve is myxomatous. Moderate mitral valve regurgitation.  No evidence of mitral stenosis. Moderate mitral annular calcification.   4. Tricuspid valve regurgitation is moderate.   5. The aortic valve is calcified. Aortic valve regurgitation is not  visualized. Suspect paradoxical low flow low gradient severe aortic  stenosis. Aortic valve area, by VTI measures 0.61 cm. Aortic valve mean  gradient measures 25.0 mmHg. Aortic valve  Vmax measures 3.20 m/s, Indexed AVA 0.33 cm2/m2, SVI 24 and DI 0.15.   6. The inferior vena cava is normal in size with greater than 50%  respiratory variability, suggesting right atrial pressure of 3 mmHg.   7. Right atrial size was mildly dilated.    Narrative 1.  Patent coronary arteries with mild nonobstructive plaquing in the mid RCA, minimal irregularities in the other vessels, no significant stenoses throughout the coronary tree 2.  Known severe paradoxical low-flow low gradient aortic stenosis by noninvasive assessment, by catheter  measurement, the mean transvalvular gradient is 22 mmHg with a calculated aortic valve area of 1.2 cm 3.  Normal right heart hemodynamics and preserved cardiac output of 5.2 L/min      Neuro/Psych negative neurological ROS  negative psych ROS   GI/Hepatic Neg liver ROS, PUD,GERD  Medicated,,  Endo/Other  Hypothyroidism    Renal/GU   negative genitourinary   Musculoskeletal   Abdominal Normal abdominal exam  (+)   Peds  Hematology  (+) Blood dyscrasia, anemia Lab Results      Component                Value               Date                      WBC                      6.8                 04/14/2023                HGB                      13.5                04/14/2023                HCT                      40.7  04/14/2023                MCV                      101.2 (H)           04/14/2023                PLT                      260                 04/14/2023              Anesthesia Other Findings   Reproductive/Obstetrics                             Anesthesia Physical Anesthesia Plan  ASA: 3  Anesthesia Plan: MAC   Post-op Pain Management: Tylenol PO (pre-op)*   Induction: Intravenous  PONV Risk Score and Plan: 2 and Ondansetron, Dexamethasone and Treatment may vary due to age or medical condition  Airway Management Planned: Simple Face Mask and Nasal Cannula  Additional Equipment: None  Intra-op Plan:   Post-operative Plan:   Informed Consent: I have reviewed the patients History and Physical, chart, labs and discussed the procedure including the risks, benefits and alternatives for the proposed anesthesia with the patient or authorized representative who has indicated his/her understanding and acceptance.     Dental advisory given  Plan Discussed with: CRNA  Anesthesia Plan Comments:        Anesthesia Quick Evaluation

## 2023-04-17 NOTE — H&P (Signed)
[address]       Judy Walker [address]             [id number]      Cardiothoracic Surgery Admission History and Physical   PCP is [name], MD Referring Provider is [name], MD Primary Cardiologist is [name], MD   Reason for admission: Severe aortic stenosis   HPI:   The patient is an 89 year old active woman with a history of hypertension, hyperlipidemia, hypothyroidism, permanent atrial fibrillation, and aortic stenosis who was referred for consideration of TAVR.  She says that she felt fine until about 5 years ago when she was noted to be in atrial fibrillation and began developing exertional fatigue and shortness of breath.  A 2D echocardiogram at that time showed a mean gradient of 10 mmHg across the aortic valve with a peak of 19 mmHg.  Stroke-volume index was 11.6 with normal left ventricular ejection fraction.  She had an echo in September 2023 showing severe thickening and calcification of the aortic valve with a mean gradient of 15.6 mmHg and a peak gradient of 25.6 mmHg.  Aortic valve area by VTI was 0.7 cm with a stroke-volume index of 25 and a dimensionless index of 0.22.  She was also felt to have moderate to severe mitral regurgitation at that time.  She was seen by Dr. [name] and it was felt that her aortic stenosis was probably still in the moderate range and continued observation was recommended.  Her most recent echo on 02/21/2023 showed an increase in the mean gradient to 25 mmHg with a peak of 41 mmHg and a valve area of 0.61 cm by VTI.  Stroke-volume index remains low at 24 with a dimensionless index of 0.15.  Mitral regurgitation is moderate.   She is widowed and lives alone.  She reports progressive fatigue and shortness of breath with exertion.  She said she is still taking care of her house but has to stop frequently to rest.  She said that if she pushes too hard she will develop dizziness and has to sit down to rest or  thinks she might pass out.  She denies any chest pain or pressure.  She has had no orthopnea.  She denies peripheral edema.       Past Medical History:  Diagnosis Date   Anemia     Atrial fibrillation (HCC)     Benign positional vertigo 2005   Colonoscopy refused      " I elected not to have one" SOC reviewed 07/06/10; she continues to decline colonscopy   HLD (hyperlipidemia)      total cholesterol 340 in 2003, NHDL 61, TG 75. NMR Lipoprofile 2005: LDL 84 (933/285), LDL glad =<120 ideally <90. Framingham study LDL goal <130   HTN (hypertension)     Hypothyroidism     Osteopenia 08/2012, 09/2015    DEXA -1.9 (2014) -2.3 (2017)   Severe aortic stenosis                 Past Surgical History:  Procedure Laterality Date   ABDOMINAL HYSTERECTOMY   2003    & BSO for cystic ovaries . No PMH of abnormal PAP   CARDIAC CATHETERIZATION        normal   CARDIOVERSION N/A 06/27/2019    Procedure: CARDIOVERSION;  Surgeon: [name], MD;  Location: [facility] ENDOSCOPY;  Service: Cardiovascular;  Laterality: N/A;   CATARACT EXTRACTION, BILATERAL   09/2011  Dr.[name]    CYSTOSCOPY        bladder @ age 35 for painful hematuria   ESOPHAGOGASTRODUODENOSCOPY   12/2019    prepyloric scarring, 4cm HH, biopsies with healing ulcer, neg H pylori [name])   ESOPHAGOGASTRODUODENOSCOPY   11/2019    UGI bleed - mass present @ [facility]   RIGHT/LEFT HEART CATH AND CORONARY ANGIOGRAPHY N/A 03/30/2023    Procedure: RIGHT/LEFT HEART CATH AND CORONARY ANGIOGRAPHY;  Surgeon: [name], MD;  Location: [facility] INVASIVE CV LAB;  Service: Cardiovascular;  Laterality: N/A;               Family History  Problem Relation Age of Onset   Kidney cancer Mother     Diabetes Father     Heart attack Father          in 23s   Breast cancer Sister     Stroke Sister     Cirrhosis Sister          veins came appart and had to be glued back together   Breast cancer Sister          mets   Breast cancer  Sister          63s   Ovarian cancer Sister     Alzheimer's disease Sister     Cancer Maternal Grandmother          unknown type   Diabetes Paternal Grandfather     Heart attack Brother          1 pre 65   Brain cancer Brother     Heart attack Brother     Other Brother          died at birth   Other Brother          died at birth   Heart disease Brother     Ulcerative colitis Son     Diabetes Other           4 brothers and  3 sisters          Social History         Socioeconomic History   Marital status: Widowed      Spouse name: Not on file   Number of children: 2   Years of education: Not on file   Highest education level: Not on file  Occupational History   Occupation: retired  Tobacco Use   Smoking status: Never   Smokeless tobacco: Never  Vaping Use   Vaping status: Never Used  Substance and Sexual Activity   Alcohol use: Yes      Comment:  RARELY   Drug use: No   Sexual activity: Not on file  Other Topics Concern   Not on file  Social History Narrative    Lives alone. Widow 2012    Daughter lives nearby.    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    Diet: daily fruits/vegetables, some water    Social Determinants of Health        Financial Resource Strain: Low Risk  (05/14/2021)    Overall Financial Resource Strain (CARDIA)     Difficulty of Paying Living Expenses: Not hard at all  Food Insecurity: No Food Insecurity (05/14/2021)    Hunger Vital Sign     Worried About Running Out of Food in the Last Year: Never true  Ran Out of Food in the Last Year: Never true  Transportation Needs: No Transportation Needs (05/14/2021)    PRAPARE - Therapist, art (Medical): No     Lack of Transportation (Non-Medical): No  Physical Activity: Insufficiently Active (05/14/2021)    Exercise Vital Sign     Days of Exercise per  Week: 3 days     Minutes of Exercise per Session: 10 min  Stress: No Stress Concern Present (05/14/2021)    [employer] of Occupational Health - Occupational Stress Questionnaire     Feeling of Stress : Not at all  Social Connections: Moderately Isolated (05/14/2021)    Social Connection and Isolation Panel [NHANES]     Frequency of Communication with Friends and Family: More than three times a week     Frequency of Social Gatherings with Friends and Family: Twice a week     Attends Religious Services: More than 4 times per year     Active Member of [employer] or Organizations: No     Attends Banker Meetings: Never     Marital Status: Widowed  Intimate Partner Violence: Not At Risk (05/14/2021)    Humiliation, Afraid, Rape, and Kick questionnaire     Fear of Current or Ex-Partner: No     Emotionally Abused: No     Physically Abused: No     Sexually Abused: No             Prior to Admission medications   Medication Sig Start Date End Date Taking? Authorizing Provider  Ascorbic Acid (VITAMIN C GUMMIE) 120 MG CHEW Chew 250 mg by mouth daily. 05/15/20     [name], MD  atorvastatin (LIPITOR) 20 MG tablet Take 1 tablet (20 mg total) by mouth daily. Patient taking differently: Take 20 mg by mouth at bedtime. 05/20/22     [name], MD  Calcium Carbonate (CALCIUM 500 PO) Take 500 mg by mouth daily.       [provider]  diltiazem (CARDIZEM CD) 240 MG 24 hr capsule TAKE 1 CAPSULE BY MOUTH EVERY DAY 10/26/22     [name], MD  diphenhydrAMINE (BENADRYL) 25 mg capsule Take 25 mg by mouth at bedtime.       [provider]  ELIQUIS 5 MG TABS tablet TAKE 1 TABLET BY MOUTH TWICE A DAY 11/22/22     [name], MD  hydrochlorothiazide (HYDRODIURIL) 25 MG tablet Take 1 tablet (25 mg total) by mouth daily. 05/20/22     [name], MD  levothyroxine (SYNTHROID) 75 MCG tablet Take 1 tablet (75 mcg total) by mouth daily. 05/20/22      [name], MD  Multiple Vitamins-Minerals (MULTIVITAMIN ADULT) TABS Take 1 tablet by mouth daily. 03/26/19     [name], MD  pantoprazole (PROTONIX) 40 MG tablet Take 1 tablet (40 mg total) by mouth daily. Patient taking differently: Take 40 mg by mouth at bedtime. 05/20/22     [name], MD  potassium gluconate 595 (99 K) MG TABS tablet Take 595 mg by mouth in the morning.       [provider]  valsartan (DIOVAN) 320 MG tablet Take 1 tablet (320 mg total) by mouth daily. 05/20/22     [name], MD            Current Outpatient Medications  Medication Sig Dispense Refill   Ascorbic Acid (VITAMIN C GUMMIE) 120 MG CHEW Chew 250 mg by mouth daily.  atorvastatin (LIPITOR) 20 MG tablet Take 1 tablet (20 mg total) by mouth daily. (Patient taking differently: Take 20 mg by mouth at bedtime.) 90 tablet 4   Calcium Carbonate (CALCIUM 500 PO) Take 500 mg by mouth daily.       diltiazem (CARDIZEM CD) 240 MG 24 hr capsule TAKE 1 CAPSULE BY MOUTH EVERY DAY 90 capsule 1   diphenhydrAMINE (BENADRYL) 25 mg capsule Take 25 mg by mouth at bedtime.       ELIQUIS 5 MG TABS tablet TAKE 1 TABLET BY MOUTH TWICE A DAY 60 tablet 5   hydrochlorothiazide (HYDRODIURIL) 25 MG tablet Take 1 tablet (25 mg total) by mouth daily. 90 tablet 4   levothyroxine (SYNTHROID) 75 MCG tablet Take 1 tablet (75 mcg total) by mouth daily. 90 tablet 4   Multiple Vitamins-Minerals (MULTIVITAMIN ADULT) TABS Take 1 tablet by mouth daily.       pantoprazole (PROTONIX) 40 MG tablet Take 1 tablet (40 mg total) by mouth daily. (Patient taking differently: Take 40 mg by mouth at bedtime.) 90 tablet 4   potassium gluconate 595 (99 K) MG TABS tablet Take 595 mg by mouth in the morning.       valsartan (DIOVAN) 320 MG tablet Take 1 tablet (320 mg total) by mouth daily. 90 tablet 4      No current facility-administered medications for this visit.        Allergies  No Known Allergies          Review of Systems:               General:                      normal appetite, + decreased energy, no weight gain, no weight loss, no fever             Cardiac:                       no chest pain with exertion, no chest pain at rest, +SOB with moderate exertion, no resting SOB, no PND, no orthopnea, no palpitations, + arrhythmia, + atrial fibrillation, no LE edema, + dizzy spells, no syncope             Respiratory:                 + exertional shortness of breath, no home oxygen, no productive cough, no dry cough, no bronchitis, no wheezing, no hemoptysis, no asthma, no pain with inspiration or cough, no sleep apnea, no CPAP at night             GI:                               no difficulty swallowing, no reflux, no frequent heartburn, no hiatal hernia, no abdominal pain, no constipation, no diarrhea, no hematochezia, no hematemesis, no melena             GU:                              no dysuria,  no frequency, no urinary tract infection, no hematuria, no kidney stones, no kidney disease             Vascular:  no pain suggestive of claudication, no pain in feet, no leg cramps, no varicose veins, no DVT, no non-healing foot ulcer             Neuro:                         no stroke, no TIA's, no seizures, no headaches, no temporary blindness one eye,  no slurred speech, no peripheral neuropathy, no chronic pain, no instability of gait, no memory/cognitive dysfunction             Musculoskeletal:         no arthritis, no joint swelling, no myalgias, no difficulty walking, normal mobility              Skin:                            no rash, no itching, no skin infections, no pressure sores or ulcerations             Psych:                         no anxiety, no depression, no nervousness, no unusual recent stress             Eyes:                           no blurry vision, no floaters, no recent vision changes, does not wear glasses or contacts             ENT:                             no hearing loss, no loose or painful teeth, no dentures, last saw dentist last year             Hematologic:               no easy bruising, no abnormal bleeding, no clotting disorder, no frequent epistaxis             Endocrine:                   no diabetes, does not check CBG's at home                            Physical Exam:               BP (!) 150/85   Pulse 94   Resp 20   Ht 5\' 6"  (1.676 m)   Wt 161 lb (73 kg)   SpO2 96% Comment: RA  BMI 25.99 kg/m              General:                      Looks younger than stated age,  well-appearing             HEENT:                       Unremarkable, NCAT, PERLA, EOMI             Neck:  no JVD, no bruits, no adenopathy              Chest:                          clear to auscultation, symmetrical breath sounds, no wheezes, no rhonchi              CV:                              RRR, 3/6 systolic murmur RSB, no diastolic murmur             Abdomen:                    soft, non-tender, no masses              Extremities:                 warm, well-perfused, pedal pulses palpable, no lower extremity edema             Rectal/GU                   Deferred             Neuro:                         Grossly non-focal and symmetrical throughout             Skin:                            Clean and dry, no rashes, no breakdown   Diagnostic Tests:     ECHOCARDIOGRAM REPORT       Patient Name:   Judy Walker Date of Exam: 02/21/2023  Medical Rec #:  [id number]         Height:       66.0 in  Accession #:    [id number]        Weight:       163.6 lb  Date of Birth:  [birth year]         BSA:          1.836 m  Patient Age:    89 years          BP:           146/84 mmHg  Patient Gender: F                 HR:           95 bpm.  Exam Location:  [address]   Procedure: 2D Echo, Color Doppler, Cardiac Doppler and 3D Echo   Indications:    Aortic Stenosis I35.0    History:        Patient has prior  history of Echocardiogram examinations,  most                 recent 02/18/2022. Arrythmias:Atrial Fibrillation; Risk                  Factors:Hypertension and Dyslipidemia.    Sonographer:    Thurman Coyer RDCS  Referring Phys: [phone] MICHAEL COOPER   IMPRESSIONS     1. Left ventricular ejection fraction, by estimation, is 55 to 60%. The  left ventricle has normal function. The left ventricle has no regional  wall motion abnormalities. Left ventricular diastolic function could not  be evaluated.   2. Right ventricular systolic function is normal. The right ventricular  size is normal. There is normal pulmonary artery systolic pressure.   3. The mitral valve is myxomatous. Moderate mitral valve regurgitation.  No evidence of mitral stenosis. Moderate mitral annular calcification.   4. Tricuspid valve regurgitation is moderate.   5. The aortic valve is calcified. Aortic valve regurgitation is not  visualized. Suspect paradoxical low flow low gradient severe aortic  stenosis. Aortic valve area, by VTI measures 0.61 cm. Aortic valve mean  gradient measures 25.0 mmHg. Aortic valve  Vmax measures 3.20 m/s, Indexed AVA 0.33 cm2/m2, SVI 24 and DI 0.15.   6. The inferior vena cava is normal in size with greater than 50%  respiratory variability, suggesting right atrial pressure of 3 mmHg.   7. Right atrial size was mildly dilated.   FINDINGS   Left Ventricle: Left ventricular ejection fraction, by estimation, is 55  to 60%. The left ventricle has normal function. The left ventricle has no  regional wall motion abnormalities. The left ventricular internal cavity  size was normal in size. There is   no left ventricular hypertrophy. Left ventricular diastolic function  could not be evaluated due to mitral annular calcification (moderate or  greater). Left ventricular diastolic function could not be evaluated.   Right Ventricle: The right ventricular size is normal. No increase in  right  ventricular wall thickness. Right ventricular systolic function is  normal. There is normal pulmonary artery systolic pressure. The tricuspid  regurgitant velocity is 2.61 m/s, and   with an assumed right atrial pressure of 3 mmHg, the estimated right  ventricular systolic pressure is 30.2 mmHg.   Left Atrium: Left atrial size was normal in size.   Right Atrium: Right atrial size was mildly dilated.   Pericardium: There is no evidence of pericardial effusion. Presence of  epicardial fat layer.   Mitral Valve: The mitral valve is myxomatous. Moderate mitral annular  calcification. Moderate mitral valve regurgitation. No evidence of mitral  valve stenosis.   Tricuspid Valve: The tricuspid valve is normal in structure. Tricuspid  valve regurgitation is moderate . No evidence of tricuspid stenosis.   Aortic Valve: The aortic valve is calcified. Aortic valve regurgitation is  not visualized. Suspect paradoxical low flow low gradient aortic stenosis.  Aortic valve mean gradient measures 25.0 mmHg. Aortic valve peak gradient  measures 41.0 mmHg. Aortic  valve area, by VTI measures 0.61 cm.   Pulmonic Valve: The pulmonic valve was normal in structure. Pulmonic valve  regurgitation is not visualized. No evidence of pulmonic stenosis.   Aorta: The aortic root is normal in size and structure.   Venous: The inferior vena cava is normal in size with greater than 50%  respiratory variability, suggesting right atrial pressure of 3 mmHg.   IAS/Shunts: No atrial level shunt detected by color flow Doppler.     LEFT VENTRICLE  PLAX 2D  LVIDd:         3.60 cm   Diastology  LVIDs:         2.30 cm   LV e' medial: 5.27 cm/s  LV PW:         1.00 cm  LV IVS:        1.00 cm  LVOT diam:     2.25 cm  LV SV:         44  LV SV Index:   24  3D Volume EF:  LVOT Area:     3.98 cm  3D EF:        52 %                           LV EDV:       71 ml                           LV ESV:       34 ml                            LV SV:        37 ml   RIGHT VENTRICLE  RV Basal diam:  3.30 cm  RV Mid diam:    2.60 cm  RV S prime:     11.30 cm/s  TAPSE (M-mode): 1.5 cm   LEFT ATRIUM             Index        RIGHT ATRIUM           Index  LA diam:        3.70 cm 2.02 cm/m   RA Area:     17.10 cm  LA Vol (A2C):   58.3 ml 31.75 ml/m  RA Volume:   49.70 ml  27.07 ml/m  LA Vol (A4C):   31.4 ml 17.10 ml/m  LA Biplane Vol: 47.5 ml 25.87 ml/m   AORTIC VALVE  AV Area (Vmax):    0.69 cm  AV Area (Vmean):   0.63 cm  AV Area (VTI):     0.61 cm  AV Vmax:           320.00 cm/s  AV Vmean:          235.000 cm/s  AV VTI:            0.724 m  AV Peak Grad:      41.0 mmHg  AV Mean Grad:      25.0 mmHg  LVOT Vmax:         55.55 cm/s  LVOT Vmean:        37.150 cm/s  LVOT VTI:          0.112 m  LVOT/AV VTI ratio: 0.15    AORTA  Ao Root diam: 3.20 cm  Ao Asc diam:  2.90 cm   TRICUSPID VALVE  TR Peak grad:   27.2 mmHg  TR Vmax:        261.00 cm/s    SHUNTS  Systemic VTI:  0.11 m  Systemic Diam: 2.25 cm   Lavona Mound Tobb DO  Electronically signed by Thomasene Ripple DO  Signature Date/Time: 02/21/2023/12:59:07 PM        Final        hysicians   Panel Physicians Referring Physician Case Authorizing Physician  [name], MD (Primary)        Procedures   RIGHT/LEFT HEART CATH AND CORONARY ANGIOGRAPHY    Conclusion   1.  Patent coronary arteries with mild nonobstructive plaquing in the mid RCA, minimal irregularities in the other vessels, no significant stenoses throughout the coronary tree 2.  Known severe paradoxical low-flow low gradient aortic stenosis by noninvasive assessment, by catheter measurement, the mean transvalvular gradient is 22 mmHg with a calculated aortic valve area of 1.2 cm 3.  Normal right heart hemodynamics and  preserved cardiac output of 5.2 L/min   Recommendations: Continue TAVR evaluation   Indications   Severe aortic stenosis [I35.0 (ICD-10-CM)]     Procedural Details   Technical Details INDICATION: Severe paradoxical low-flow low gradient aortic stenosis  PROCEDURAL DETAILS: The right wrist is prepped, draped, and anesthetized with 1% lidocaine. Using the modified Seldinger technique, a 5/6 French Slender sheath is introduced into the right radial artery. 3 mg of verapamil is administered through the sheath, weight-based unfractionated heparin was administered intravenously. Standard Judkins catheters are used for selective coronary angiography. LV pressure is recorded and an aortic valve pullback gradient is measured.  Catheter exchanges are performed over an exchange length guidewire. There are no immediate procedural complications. A TR band is used for radial hemostasis at the completion of the procedure.  The patient was transferred to the post catheterization recovery area for further monitoring.      Estimated blood loss <50 mL.   During this procedure medications were administered to achieve and maintain moderate conscious sedation while the patient's heart rate, blood pressure, and oxygen saturation were continuously monitored and I was present face-to-face 100% of this time.    Medications (Filter: Administrations occurring from 1224 to 1317 on 03/30/23) Heparin (Porcine) in NaCl 1000-0.9 UT/500ML-% SOLN (mL)  Total volume: 1,000 mL Date/Time Rate/Dose/Volume Action    03/30/23 1238 500 mL Given    1239 500 mL Given    fentaNYL (SUBLIMAZE) injection (mcg)  Total dose: 25 mcg Date/Time Rate/Dose/Volume Action    03/30/23 1239 25 mcg Given    midazolam (VERSED) injection (mg)  Total dose: 2 mg Date/Time Rate/Dose/Volume Action    03/30/23 1239 2 mg Given    lidocaine (PF) (XYLOCAINE) 1 % injection (mL)  Total volume: 4 mL Date/Time Rate/Dose/Volume Action    03/30/23 1246 2 mL Given    1247 2 mL Given    Radial Cocktail/Verapamil only (mL)  Total volume: 10 mL Date/Time Rate/Dose/Volume Action    03/30/23 1248 10  mL Given    heparin sodium (porcine) injection (Units)  Total dose: 4,000 Units Date/Time Rate/Dose/Volume Action    03/30/23 1255 4,000 Units Given    iohexol (OMNIPAQUE) 350 MG/ML injection (mL)  Total volume: 40 mL Date/Time Rate/Dose/Volume Action    03/30/23 1302 40 mL Given      Sedation Time   Sedation Time Physician-1: 20 minutes 58 seconds Contrast        Administrations occurring from 1224 to 1317 on 03/30/23:  Medication Name Total Dose  iohexol (OMNIPAQUE) 350 MG/ML injection 40 mL    Radiation/Fluoro   Fluoro time: 2.4 (min) DAP: 4.6 (Gycm2) Cumulative Air Kerma: 82.1 (mGy) Complications   Complications documented before study signed (03/30/2023  1:17 PM)    No complications were associated with this study.  Documented by London Sheer T - 03/30/2023  1:02 PM      Coronary Findings   Diagnostic Dominance: Right Left Main  The vessel exhibits minimal luminal irregularities. The left main is patent with no stenosis. The vessel divides into the LAD and circumflex.    Left Anterior Descending  The vessel exhibits minimal luminal irregularities. The LAD is patent to the apex without significant stenosis. The diagonal branches are patent with no significant stenoses.    Left Circumflex  The vessel exhibits minimal luminal irregularities. Circumflex is patent with mild irregularity, 2 patent OM branches without significant stenosis.    Right Coronary Artery  There is mild diffuse disease throughout the vessel. Patent  RCA, dominant vessel, mild nonobstructive mid vessel plaquing noted. No significant stenosis throughout.  Mid RCA lesion is 30% stenosed.    Intervention    No interventions have been documented.    Left Heart   Aortic Valve There is moderate aortic valve stenosis. The aortic valve is calcified. There is restricted aortic valve motion.    Coronary Diagrams   Diagnostic Dominance: Right  Intervention    Implants    No implant  documentation for this case.    Syngo Images    Show images for CARDIAC CATHETERIZATION Images on Long Term Storage    Show images for Jennyfer, Abby to Procedure Log   Procedure Log    Link to Procedure Log   Procedure Log    Hemo Data   Flowsheet Row Most Recent Value  Fick Cardiac Output 5.21 L/min  Fick Cardiac Output Index 2.86 (L/min)/BSA  Aortic Mean Gradient 21.79 mmHg  Aortic Peak Gradient 24.4 mmHg  Aortic Valve Area 1.22  Aortic Value Area Index 0.67 cm2/BSA  RA A Wave 6 mmHg  RA V Wave 6 mmHg  RA Mean 4 mmHg  RV Systolic Pressure 33 mmHg  RV Diastolic Pressure -4 mmHg  RV EDP 5 mmHg  PA Systolic Pressure 33 mmHg  PA Diastolic Pressure 12 mmHg  PA Mean 20 mmHg  PW A Wave 12 mmHg  PW V Wave 23 mmHg  PW Mean 12 mmHg  AO Systolic Pressure 116 mmHg  AO Diastolic Pressure 59 mmHg  AO Mean 82 mmHg  LV Systolic Pressure 170 mmHg  LV Diastolic Pressure -5 mmHg  LV EDP 16 mmHg  AOp Systolic Pressure 144 mmHg  AOp Diastolic Pressure 76 mmHg  AOp Mean Pressure 105 mmHg  LVp Systolic Pressure 167 mmHg  LVp Diastolic Pressure -6 mmHg  LVp EDP Pressure 16 mmHg  QP/QS 1  TPVR Index 7 HRUI  TSVR Index 34.67 HRUI  PVR SVR Ratio 0.08  TPVR/TSVR Ratio 0.2      ADDENDUM REPORT: 04/03/2023 14:54   EXAM: OVER-READ INTERPRETATION  CT CHEST   The following report is an over-read performed by radiologist Dr. Jacob Moores [facility] on 04/03/2023. This over-read does not include interpretation of cardiac or coronary anatomy or pathology. The cardiac TAVR interpretation by the cardiologist is attached.   COMPARISON:  None.   FINDINGS: Extracardiac findings will be described separately under dictation for contemporaneously obtained CTA chest, abdomen and pelvis.   IMPRESSION: Please see separate dictation for contemporaneously obtained CTA chest, abdomen and pelvis dated 04/03/2023 for full description of relevant extracardiac  findings.     Electronically Signed   By: Allegra Lai M.D.   On: 04/03/2023 14:54    Addended by Renford Dills, MD on 04/03/2023  2:56 PM    Study Result   Narrative & Impression  CLINICAL DATA:  Aortic Valve pathology with assessment for TAVR   EXAM: Cardiac TAVR CT   TECHNIQUE: The patient was scanned on a Siemens Force 192 slice scanner. A 120 kV retrospective scan was triggered in the descending thoracic aorta at 111 HU's. Gantry rotation speed was 270 msecs and collimation was .9 mm. No beta blockade or nitro were given. The 3D data set was reconstructed in 5% intervals of the R-R cycle. Systolic and diastolic phases were analyzed on a dedicated work station using MPR, MIP and VRT modes. The patient received 95 cc of contrast.   FINDINGS: Aortic Valve: Severely thickened aortic valve with heavy  calcification and reduced excursion the planimeter valve area is 0.986 Sq cm consistent with severe aortic stenosis   Number of leaflets: 3   Annular calcification: Technically severe- two small calcified nodules but presence of LVOT calcification   Aortic Valve Calcium Score: 2242   Presence of basal septal hypertrophy: No   Perimembranous septal diameter: 5 mm   Mitral Valve: Moderate mitral annular calcification. Bi-leaflet prolapse. Mitral annular disjunction noted, disjunction distance 4 mm.   Aortic Annulus Measurements- 20% Phase   Major annulus diameter: 27 mm   Minor annulus diameter: 22 mm   Annular perimeter: 77 mm   Annular area: 4.56 cm2   Aortic Root Measurements   Sinotubular Junction: 29 mm   Ascending Thoracic Aorta: 31 mm   Aortic Arch: 27 mm   Descending Thoracic Aorta: 24 mm   Aortic atherosclerosis.   Sinus of Valsalva Measurements:   Right coronary cusp width: 31 mm   Left coronary cusp width: 31 mm   Non coronary cusp width: 31 mm   Coronary Artery Height above Annulus:   Left Main: 19 mm   Left SoV height: 22  mm   Right Coronary: 17 mm   Right SoV height: 22 mm   Optimum Fluoroscopic Angle for Delivery: LAO 17, CAU 16   Valves for structural team consideration: 26 mm Sapien Valve, 29 mm Evolut Valve   Non TAVR Valve Findings:   Coronary Arteries: Normal coronary origin. Study not completed with nitroglycerin.   Coronary Calcium Score:   Left main: 16   Left anterior descending artery: 43   Left circumflex artery: 8   Right coronary artery: 58   Total: 125   Systemic veins:Normal anatomy   Main Pulmonary artery: Normal caliber   Pulmonary veins: Normal anatomy.   Left atrial appendage: Patent   Interatrial septum: No communications.   Left ventricle:  Normal   Left atrium: Dilated   Right ventricle: Dilated   Right atrium: Dilated   Pericardium: No calcifications   Extra Cardiac Findings as per separate reporting.   IMPRESSION: 1. Severe Aortic stenosis. Findings pertinent to TAVR procedure are detailed above.   RECOMMENDATIONS:   The proposed cut-off value of 1,651 AU yielded a 93 % sensitivity and 75 % specificity in grading AS severity in patients with classical low-flow, low-gradient AS. Proposed different cut-off values to define severe AS for men and women as 2,065 AU and 1,274 AU, respectively. The joint European and American recommendations for the assessment of AS consider the aortic valve calcium score as a continuum - a very high calcium score suggests severe AS and a low calcium score suggests severe AS is unlikely.   Sunday Shams, et al. 2017 ESC/EACTS Guidelines for the management of valvular heart disease. Eur Heart J [phone]   Coronary artery calcium (CAC) score is a strong predictor of incident coronary heart disease (CHD) and provides predictive information beyond traditional risk factors. CAC scoring is reasonable to use in the decision to withhold, postpone, or initiate statin therapy in intermediate-risk  or selected borderline-risk asymptomatic adults (age 16-75 years and LDL-C >=70 to <190 mg/dL) who do not have diabetes or established atherosclerotic cardiovascular disease (ASCVD).* In intermediate-risk (10-year ASCVD risk >=7.5% to <20%) adults or selected borderline-risk (10-year ASCVD risk >=5% to <7.5%) adults in whom a CAC score is measured for the purpose of making a treatment decision the following recommendations have been made:   If CAC = 0, it is reasonable to  withhold statin therapy and reassess in 5 to 10 years, as long as higher risk conditions are absent (diabetes mellitus, family history of premature CHD in first degree relatives (males <55 years; females <65 years), cigarette smoking, LDL >=190 mg/dL or other independent risk factors).   If CAC is 1 to 99, it is reasonable to initiate statin therapy for patients >=80 years of age.   If CAC is >=100 or >=75th percentile, it is reasonable to initiate statin therapy at any age.   Cardiology referral should be considered for patients with CAC scores >=400 or >=75th percentile.   *2018 AHA/ACC/AACVPR/AAPA/ABC/ACPM/ADA/AGS/APhA/ASPC/NLA/PCNA Guideline on the Management of Blood Cholesterol: A Report of the American College of Cardiology/American Heart Association Task Force on Clinical Practice Guidelines. J Am Coll Cardiol. 2019;73(24):3168-3209.   Mahesh  Chandrasekhar   Electronically Signed: By: Riley Lam M.D. On: 04/03/2023 14:23        Narrative & Impression  CLINICAL DATA:  Aortic valve replacement preop evaluation   EXAM: CT ANGIOGRAPHY CHEST, ABDOMEN AND PELVIS   TECHNIQUE: Multidetector CT imaging through the chest, abdomen and pelvis was performed using the standard protocol during bolus administration of intravenous contrast. Multiplanar reconstructed images and MIPs were obtained and reviewed to evaluate the vascular anatomy.   RADIATION DOSE REDUCTION: This exam was performed  according to the departmental dose-optimization program which includes automated exposure control, adjustment of the mA and/or kV according to patient size and/or use of iterative reconstruction technique.   CONTRAST:  95mL OMNIPAQUE IOHEXOL 350 MG/ML SOLN   COMPARISON:  Abdomen CT dated December 05, 2019   FINDINGS: CTA CHEST FINDINGS   Cardiovascular: Cardiomegaly. Aortic valve thickening and calcifications. Normal caliber thoracic aorta with moderate atherosclerotic disease. Left main and three-vessel coronary artery calcifications.   Mediastinum/Nodes: Small hiatal hernia. Thyroid is unremarkable. No enlarged lymph nodes seen in the chest.   Lungs/Pleura: Central airways are patent. Biapical pleural-parenchymal scarring with calcifications. No consolidation, pleural effusion or pneumothorax. Small bilateral solid pulmonary nodules. Reference left lower lobe 3 minimal meter nodule located on series 9, image 76. Reference right middle lobe pulmonary nodule measuring 2 mm on series 9, image 65.   Musculoskeletal: No chest wall abnormality. No acute or significant osseous findings.   CTA ABDOMEN AND PELVIS FINDINGS   Hepatobiliary: No focal liver abnormality is seen. Focal wall thickening of the neck of the gallbladder. No biliary ductal dilation.   Pancreas: Unremarkable. No pancreatic ductal dilatation or surrounding inflammatory changes.   Spleen: Normal in size without focal abnormality.   Adrenals/Urinary Tract: Bilateral adrenal glands are unremarkable. Prominent bilateral extrarenal pelvises, unchanged when compared with the prior. No hydronephrosis or nephrolithiasis.   Stomach/Bowel: Stomach is within normal limits. Appendix appears normal. No evidence of bowel wall thickening, distention, or inflammatory changes.   Vascular/lymphatic: Caliber thoracic aorta with severe atherosclerotic disease. No enlarged lymph nodes seen in the abdomen or pelvis.    Reproductive: No adnexal masses.   Other: No abdominal wall hernia or abnormality. No abdominopelvic ascites.   Musculoskeletal: No acute or significant osseous findings.   VASCULAR MEASUREMENTS PERTINENT TO TAVR:   AORTA:   Minimal Aortic Diameter -  10.3 mm   Severity of Aortic Calcification-severe   RIGHT PELVIS:   Right Common Iliac Artery -   Minimal Diameter-5.2 mm   Tortuosity-moderate   Calcification-severe   Right External Iliac Artery -   Minimal Diameter-6.6 mm   Tortuosity-mild   Calcification-none   Right Common Femoral Artery -  Minimal Diameter-5.6 mm   Tortuosity-mild   Calcification-moderate   LEFT PELVIS:   Left Common Iliac Artery -   Minimal Diameter-6.4 mm   Tortuosity-mild   Calcification-severe   Left External Iliac Artery -   Minimal Diameter-7.3 mm   Tortuosity-mild   Calcification-none   Left Common Femoral Artery -   Minimal Diameter-5.8 mm   Tortuosity-mild   Calcification-severe   Review of the MIP images confirms the above findings.   IMPRESSION: Vascular:   1. Vascular findings and measurements pertinent to potential TAVR procedure, as detailed above. 2. Thickening and calcification of the aortic valve, compatible with reported clinical history of aortic stenosis. 3. Severe aortoiliac atherosclerosis. Left main and 3 vessel coronary artery disease.   Nonvascular:   1. Focal wall thickening of the neck of the gallbladder, could be due to adenomyomatosis although neoplasm can not be excluded, however recommend further evaluation with right upper quadrant ultrasound. 2. Small bilateral solid pulmonary nodules measuring up to 3 mm. No follow-up needed if patient is low-risk.This recommendation follows the consensus statement: Guidelines for Management of Incidental Pulmonary Nodules Detected on CT Images: From the Fleischner Society 2017; Radiology 2017; 284:228-243.     Electronically Signed    By: Allegra Lai M.D.   On: 04/03/2023 13:58        Impression:   This 89 year old woman has stage D3, severe, symptomatic low-flow/low gradient aortic stenosis with NYHA class II symptoms of progressive exertional fatigue and shortness of breath as well as lightheadedness consistent with chronic diastolic congestive heart failure.  I have personally reviewed her 2D echocardiogram, cardiac catheterization, and CTA studies.  Her echocardiogram shows a severely thickened and calcified aortic valve with a mean gradient of 25 mmHg and a valve area of 0.61 cm by VTI.  Left ventricular ejection fraction is normal with a low stroke-volume index of 24 and dimensionless index of 0.15 consistent with low-flow/low gradient severe aortic stenosis.  Her cardiac catheterization shows mild nonobstructive disease.  I agree that aortic valve replacement is indicated in this active patient for relief of her symptoms to improve her quality of life and to prevent progressive left ventricular deterioration.  Given her advanced age I think transcatheter aortic valve replacement would be the best treatment for her.  Her gated cardiac CTA shows anatomy suitable for TAVR using a 26 mm SAPIEN 3 valve.  Her abdominal and pelvic CTA shows adequate pelvic vascular anatomy to allow transfemoral insertion.   The patient and her family were counseled at length regarding treatment alternatives for management of severe symptomatic aortic stenosis. The risks and benefits of surgical intervention has been discussed in detail. Long-term prognosis with medical therapy was discussed. Alternative approaches such as conventional surgical aortic valve replacement, transcatheter aortic valve replacement, and palliative medical therapy were compared and contrasted at length. This discussion was placed in the context of the patient's own specific clinical presentation and past medical history. All of their questions have been addressed.     Following the decision to proceed with transcatheter aortic valve replacement, a discussion was held regarding what types of management strategies would be attempted intraoperatively in the event of life-threatening complications, including whether or not the patient would be considered a candidate for the use of cardiopulmonary bypass and/or conversion to open sternotomy for attempted surgical intervention.  She is still very active and independent at 89 years old and I think she would be a candidate for emergent sternotomy to manage any intraoperative complications.  The  patient has been advised of a variety of complications that might develop including but not limited to risks of death, stroke, paravalvular leak, aortic dissection or other major vascular complications, aortic annulus rupture, device embolization, cardiac rupture or perforation, mitral regurgitation, acute myocardial infarction, arrhythmia, heart block or bradycardia requiring permanent pacemaker placement, congestive heart failure, respiratory failure, renal failure, pneumonia, infection, other late complications related to structural valve deterioration or migration, or other complications that might ultimately cause a temporary or permanent loss of functional independence or other long term morbidity. The patient provides full informed consent for the procedure as described and all questions were answered.       Plan:   Transfemoral TAVR using a SAPIEN 3 valve.      Alleen Borne, MD

## 2023-04-18 ENCOUNTER — Inpatient Hospital Stay (HOSPITAL_COMMUNITY)
Admission: RE | Admit: 2023-04-18 | Discharge: 2023-04-19 | DRG: 267 | Disposition: A | Discharge status: Home / Self Care | Payer: Medicare Other | Attending: Surgery | Admitting: Surgery

## 2023-04-18 ENCOUNTER — Other Ambulatory Visit: Payer: Medicare Other

## 2023-04-18 ENCOUNTER — Encounter (HOSPITAL_COMMUNITY)
Admission: RE | Disposition: A | Discharge status: Home / Self Care | Payer: Self-pay | Source: Home / Self Care | Attending: Surgery

## 2023-04-18 ENCOUNTER — Other Ambulatory Visit: Payer: Self-pay | Admitting: Physician Assistant

## 2023-04-18 ENCOUNTER — Inpatient Hospital Stay (HOSPITAL_COMMUNITY): Payer: Medicare Other

## 2023-04-18 ENCOUNTER — Encounter (HOSPITAL_COMMUNITY): Payer: Self-pay | Admitting: Cardiovascular Disease

## 2023-04-18 ENCOUNTER — Other Ambulatory Visit: Payer: Self-pay

## 2023-04-18 ENCOUNTER — Ambulatory Visit: Payer: Medicare Other

## 2023-04-18 ENCOUNTER — Inpatient Hospital Stay (HOSPITAL_COMMUNITY): Payer: Medicare Other | Admitting: Physician Assistant

## 2023-04-18 ENCOUNTER — Inpatient Hospital Stay (HOSPITAL_COMMUNITY): Payer: Medicare Other | Admitting: Anesthesiology

## 2023-04-18 DIAGNOSIS — Z79890 Hormone replacement therapy (postmenopausal): Secondary | ICD-10-CM

## 2023-04-18 DIAGNOSIS — Z90710 Acquired absence of both cervix and uterus: Secondary | ICD-10-CM

## 2023-04-18 DIAGNOSIS — Z7901 Long term (current) use of anticoagulants: Secondary | ICD-10-CM

## 2023-04-18 DIAGNOSIS — E7800 Pure hypercholesterolemia, unspecified: Secondary | ICD-10-CM | POA: Diagnosis present

## 2023-04-18 DIAGNOSIS — I10 Essential (primary) hypertension: Secondary | ICD-10-CM | POA: Diagnosis present

## 2023-04-18 DIAGNOSIS — Z90722 Acquired absence of ovaries, bilateral: Secondary | ICD-10-CM

## 2023-04-18 DIAGNOSIS — I350 Nonrheumatic aortic (valve) stenosis: Secondary | ICD-10-CM

## 2023-04-18 DIAGNOSIS — I340 Nonrheumatic mitral (valve) insufficiency: Secondary | ICD-10-CM | POA: Diagnosis not present

## 2023-04-18 DIAGNOSIS — I129 Hypertensive chronic kidney disease with stage 1 through stage 4 chronic kidney disease, or unspecified chronic kidney disease: Secondary | ICD-10-CM | POA: Diagnosis present

## 2023-04-18 DIAGNOSIS — I4821 Permanent atrial fibrillation: Secondary | ICD-10-CM | POA: Diagnosis present

## 2023-04-18 DIAGNOSIS — I7300 Raynaud's syndrome without gangrene: Secondary | ICD-10-CM | POA: Diagnosis present

## 2023-04-18 DIAGNOSIS — Z006 Encounter for examination for normal comparison and control in clinical research program: Secondary | ICD-10-CM

## 2023-04-18 DIAGNOSIS — Z79899 Other long term (current) drug therapy: Secondary | ICD-10-CM

## 2023-04-18 DIAGNOSIS — R918 Other nonspecific abnormal finding of lung field: Secondary | ICD-10-CM | POA: Diagnosis present

## 2023-04-18 DIAGNOSIS — N183 Chronic kidney disease, stage 3 unspecified: Secondary | ICD-10-CM | POA: Diagnosis not present

## 2023-04-18 DIAGNOSIS — Z952 Presence of prosthetic heart valve: Principal | ICD-10-CM

## 2023-04-18 DIAGNOSIS — Z8249 Family history of ischemic heart disease and other diseases of the circulatory system: Secondary | ICD-10-CM

## 2023-04-18 DIAGNOSIS — Z7984 Long term (current) use of oral hypoglycemic drugs: Secondary | ICD-10-CM

## 2023-04-18 DIAGNOSIS — E039 Hypothyroidism, unspecified: Secondary | ICD-10-CM | POA: Diagnosis not present

## 2023-04-18 DIAGNOSIS — Z954 Presence of other heart-valve replacement: Secondary | ICD-10-CM | POA: Diagnosis not present

## 2023-04-18 HISTORY — PX: INTRAOPERATIVE TRANSTHORACIC ECHOCARDIOGRAM: SHX6523

## 2023-04-18 HISTORY — DX: Presence of prosthetic heart valve: Z95.2

## 2023-04-18 LAB — POCT I-STAT, CHEM 8
BUN: 27 mg/dL — ABNORMAL HIGH (ref 8–23)
Calcium, Ion: 1.23 mmol/L (ref 1.15–1.40)
Chloride: 101 mmol/L (ref 98–111)
Creatinine, Ser: 1 mg/dL (ref 0.44–1.00)
Glucose, Bld: 117 mg/dL — ABNORMAL HIGH (ref 70–99)
HCT: 33 % — ABNORMAL LOW (ref 36.0–46.0)
Hemoglobin: 11.2 g/dL — ABNORMAL LOW (ref 12.0–15.0)
Potassium: 3 mmol/L — ABNORMAL LOW (ref 3.5–5.1)
Sodium: 139 mmol/L (ref 135–145)
TCO2: 26 mmol/L (ref 22–32)

## 2023-04-18 LAB — ECHOCARDIOGRAM LIMITED
AR max vel: 3.91 cm2
AV Area VTI: 3.08 cm2
AV Area mean vel: 3.78 cm2
AV Mean grad: 2 mm[Hg]
AV Peak grad: 3 mm[Hg]
Ao pk vel: 0.87 m/s
S' Lateral: 2.3 cm

## 2023-04-18 LAB — ABO/RH: ABO/RH(D): O POS

## 2023-04-18 SURGERY — TRANSCATHETER AORTIC VALVE REPLACEMENT, TRANSFEMORAL (CATHLAB)
Anesthesia: Monitor Anesthesia Care

## 2023-04-18 MED ORDER — CEFAZOLIN SODIUM-DEXTROSE 2-4 GM/100ML-% IV SOLN
2.0000 g | Freq: Three times a day (TID) | INTRAVENOUS | Status: AC
  Administered 2023-04-18 (×2): 2 g via INTRAVENOUS
  Filled 2023-04-18 (×2): qty 100

## 2023-04-18 MED ORDER — DILTIAZEM HCL ER COATED BEADS 120 MG PO CP24
240.0000 mg | ORAL_CAPSULE | Freq: Every day | ORAL | Status: DC
  Administered 2023-04-18 – 2023-04-19 (×2): 240 mg via ORAL
  Filled 2023-04-18 (×2): qty 2

## 2023-04-18 MED ORDER — CHLORHEXIDINE GLUCONATE 4 % EX SOLN
30.0000 mL | CUTANEOUS | Status: DC
  Filled 2023-04-18: qty 30

## 2023-04-18 MED ORDER — ATORVASTATIN CALCIUM 10 MG PO TABS
20.0000 mg | ORAL_TABLET | Freq: Every day | ORAL | Status: DC
  Administered 2023-04-18 – 2023-04-19 (×2): 20 mg via ORAL
  Filled 2023-04-18 (×2): qty 2

## 2023-04-18 MED ORDER — ONDANSETRON HCL 4 MG/2ML IJ SOLN: 4.0000 mg | Freq: Four times a day (QID) | INTRAMUSCULAR | Status: DC | PRN

## 2023-04-18 MED ORDER — CHLORHEXIDINE GLUCONATE 0.12 % MT SOLN: 15.0000 mL | Freq: Once | OROMUCOSAL | Status: AC

## 2023-04-18 MED ORDER — ACETAMINOPHEN 650 MG RE SUPP: 650.0000 mg | Freq: Four times a day (QID) | RECTAL | Status: DC | PRN

## 2023-04-18 MED ORDER — PROTAMINE SULFATE 10 MG/ML IV SOLN
INTRAVENOUS | Status: DC | PRN
  Administered 2023-04-18: 30 mg via INTRAVENOUS
  Administered 2023-04-18: 10 mg via INTRAVENOUS

## 2023-04-18 MED ORDER — CHLORHEXIDINE GLUCONATE 0.12 % MT SOLN: 15.0000 mL | Freq: Once | OROMUCOSAL | Status: DC

## 2023-04-18 MED ORDER — FENTANYL CITRATE (PF) 100 MCG/2ML IJ SOLN
INTRAMUSCULAR | Status: DC | PRN
  Administered 2023-04-18 (×2): 25 ug via INTRAVENOUS

## 2023-04-18 MED ORDER — SODIUM CHLORIDE 0.9 % IV SOLN: INTRAVENOUS | Status: AC

## 2023-04-18 MED ORDER — SODIUM CHLORIDE 0.9% FLUSH
3.0000 mL | Freq: Two times a day (BID) | INTRAVENOUS | Status: DC
  Administered 2023-04-19: 3 mL via INTRAVENOUS

## 2023-04-18 MED ORDER — DIPHENHYDRAMINE HCL 25 MG PO CAPS
25.0000 mg | ORAL_CAPSULE | Freq: Once | ORAL | Status: AC
  Administered 2023-04-18: 25 mg via ORAL
  Filled 2023-04-18: qty 1

## 2023-04-18 MED ORDER — APIXABAN 5 MG PO TABS
5.0000 mg | ORAL_TABLET | Freq: Two times a day (BID) | ORAL | Status: DC
  Administered 2023-04-19: 5 mg via ORAL
  Filled 2023-04-18: qty 1

## 2023-04-18 MED ORDER — MORPHINE SULFATE (PF) 2 MG/ML IV SOLN: 1.0000 mg | INTRAVENOUS | Status: DC | PRN

## 2023-04-18 MED ORDER — PHENYLEPHRINE HCL-NACL 20-0.9 MG/250ML-% IV SOLN
INTRAVENOUS | Status: DC | PRN
  Administered 2023-04-18: 50 ug/min via INTRAVENOUS

## 2023-04-18 MED ORDER — POTASSIUM CHLORIDE CRYS ER 20 MEQ PO TBCR
40.0000 meq | EXTENDED_RELEASE_TABLET | Freq: Once | ORAL | Status: AC
  Administered 2023-04-18: 40 meq via ORAL
  Filled 2023-04-18: qty 2

## 2023-04-18 MED ORDER — OXYCODONE HCL 5 MG PO TABS: 5.0000 mg | ORAL_TABLET | ORAL | Status: DC | PRN

## 2023-04-18 MED ORDER — CHLORHEXIDINE GLUCONATE 4 % EX SOLN
60.0000 mL | Freq: Once | CUTANEOUS | Status: DC
  Filled 2023-04-18: qty 60

## 2023-04-18 MED ORDER — TRAMADOL HCL 50 MG PO TABS: 50.0000 mg | ORAL_TABLET | ORAL | Status: DC | PRN

## 2023-04-18 MED ORDER — SODIUM CHLORIDE 0.9 % IV SOLN: 250.0000 mL | INTRAVENOUS | Status: DC | PRN

## 2023-04-18 MED ORDER — FENTANYL CITRATE (PF) 100 MCG/2ML IJ SOLN
INTRAMUSCULAR | Status: AC
  Filled 2023-04-18: qty 2

## 2023-04-18 MED ORDER — ONDANSETRON HCL 4 MG/2ML IJ SOLN
INTRAMUSCULAR | Status: DC | PRN
  Administered 2023-04-18: 4 mg via INTRAVENOUS

## 2023-04-18 MED ORDER — HEPARIN SODIUM (PORCINE) 1000 UNIT/ML IJ SOLN
INTRAMUSCULAR | Status: DC | PRN
  Administered 2023-04-18: 8000 [IU] via INTRAVENOUS

## 2023-04-18 MED ORDER — CHLORHEXIDINE GLUCONATE 0.12 % MT SOLN
OROMUCOSAL | Status: AC
  Administered 2023-04-18: 15 mL via OROMUCOSAL
  Filled 2023-04-18: qty 15

## 2023-04-18 MED ORDER — LACTATED RINGERS IV SOLN
INTRAVENOUS | Status: DC
Start: 2023-04-18 — End: 2023-04-18

## 2023-04-18 MED ORDER — IOHEXOL 350 MG/ML SOLN
INTRAVENOUS | Status: DC | PRN
  Administered 2023-04-18: 30 mL

## 2023-04-18 MED ORDER — IODIXANOL 320 MG/ML IV SOLN: INTRAVENOUS | Status: DC | PRN

## 2023-04-18 MED ORDER — LIDOCAINE HCL (PF) 1 % IJ SOLN
INTRAMUSCULAR | Status: DC | PRN
  Administered 2023-04-18 (×2): 10 mL

## 2023-04-18 MED ORDER — EPHEDRINE SULFATE-NACL 50-0.9 MG/10ML-% IV SOSY
PREFILLED_SYRINGE | INTRAVENOUS | Status: DC | PRN
  Administered 2023-04-18: 5 mg via INTRAVENOUS

## 2023-04-18 MED ORDER — PROPOFOL 500 MG/50ML IV EMUL
INTRAVENOUS | Status: DC | PRN
  Administered 2023-04-18: 80 ug/kg/min via INTRAVENOUS

## 2023-04-18 MED ORDER — LIDOCAINE 2% (20 MG/ML) 5 ML SYRINGE
INTRAMUSCULAR | Status: DC | PRN
  Administered 2023-04-18: 20 mg via INTRAVENOUS

## 2023-04-18 MED ORDER — SODIUM CHLORIDE 0.9% FLUSH: 3.0000 mL | INTRAVENOUS | Status: DC | PRN

## 2023-04-18 MED ORDER — LEVOTHYROXINE SODIUM 75 MCG PO TABS
75.0000 ug | ORAL_TABLET | Freq: Every day | ORAL | Status: DC
  Administered 2023-04-19: 75 ug via ORAL
  Filled 2023-04-18: qty 1

## 2023-04-18 MED ORDER — PANTOPRAZOLE SODIUM 40 MG PO TBEC
40.0000 mg | DELAYED_RELEASE_TABLET | Freq: Every day | ORAL | Status: DC
  Administered 2023-04-18 – 2023-04-19 (×2): 40 mg via ORAL
  Filled 2023-04-18 (×2): qty 1

## 2023-04-18 MED ORDER — NITROGLYCERIN IN D5W 200-5 MCG/ML-% IV SOLN
0.0000 ug/min | INTRAVENOUS | Status: DC
  Filled 2023-04-18: qty 250

## 2023-04-18 MED ORDER — HEPARIN (PORCINE) IN NACL 2000-0.9 UNIT/L-% IV SOLN
INTRAVENOUS | Status: DC | PRN
  Administered 2023-04-18: 1000 mL

## 2023-04-18 MED ORDER — LIDOCAINE HCL (PF) 1 % IJ SOLN
INTRAMUSCULAR | Status: AC
  Filled 2023-04-18: qty 30

## 2023-04-18 MED ORDER — SODIUM CHLORIDE 0.9 % IV SOLN
INTRAVENOUS | Status: DC
Start: 2023-04-18 — End: 2023-04-18

## 2023-04-18 MED ORDER — ACETAMINOPHEN 325 MG PO TABS: 650.0000 mg | ORAL_TABLET | Freq: Four times a day (QID) | ORAL | Status: DC | PRN

## 2023-04-18 MED ORDER — HEPARIN (PORCINE) IN NACL 1000-0.9 UT/500ML-% IV SOLN
INTRAVENOUS | Status: DC | PRN
  Administered 2023-04-18: 500 mL

## 2023-04-18 SURGICAL SUPPLY — 28 items
BAG SNAP BAND KOVER 36X36 (MISCELLANEOUS) ×4 IMPLANT
CABLE ADAPT PACING TEMP 12FT (ADAPTER) ×1 IMPLANT
CATH 26 ULTRA DELIVERY (CATHETERS) ×1 IMPLANT
CATH DIAG 6FR PIGTAIL ANGLED (CATHETERS) ×1 IMPLANT
CATH INFINITI 5FR ANG PIGTAIL (CATHETERS) ×1 IMPLANT
CATH INFINITI 6F AL1 (CATHETERS) ×1 IMPLANT
CATH S G BIP PACING (CATHETERS) ×1 IMPLANT
CLOSURE MYNX CONTROL 6F/7F (Vascular Products) ×1 IMPLANT
CLOSURE PERCLOSE PROSTYLE (VASCULAR PRODUCTS) ×2 IMPLANT
CRIMPER (MISCELLANEOUS) ×1 IMPLANT
DEVICE INFLATION ATRION QL2530 (MISCELLANEOUS) ×1 IMPLANT
KIT MICROPUNCTURE NIT STIFF (SHEATH) ×1 IMPLANT
KIT SAPIAN 3 ULTRA RESILIA 26 (Valve) ×1 IMPLANT
KIT SYRINGE INJ CVI SPIKEX1 (MISCELLANEOUS) ×1 IMPLANT
PACK CARDIAC CATHETERIZATION (CUSTOM PROCEDURE TRAY) ×2 IMPLANT
SET ATX-X65L (MISCELLANEOUS) ×1 IMPLANT
SHEATH BRITE TIP 7FR 35CM (SHEATH) ×1 IMPLANT
SHEATH INTRODUCER SET 20-26 (SHEATH) ×1 IMPLANT
SHEATH PINNACLE 6F 10CM (SHEATH) ×1 IMPLANT
SHEATH PINNACLE 8F 10CM (SHEATH) ×1 IMPLANT
SHEATH PROBE COVER 6X72 (BAG) ×1 IMPLANT
SHIELD CATH-GARD CONTAMINATION (MISCELLANEOUS) ×1 IMPLANT
STOPCOCK MORSE 400PSI 3WAY (MISCELLANEOUS) ×4 IMPLANT
WIRE AMPLATZ SS-J .035X260CM (WIRE) ×1 IMPLANT
WIRE EMERALD 3MM-J .035X150CM (WIRE) ×1 IMPLANT
WIRE EMERALD 3MM-J .035X260CM (WIRE) ×1 IMPLANT
WIRE EMERALD ST .035X260CM (WIRE) ×1 IMPLANT
WIRE SAFARI SM CURVE 275 (WIRE) ×1 IMPLANT

## 2023-04-18 NOTE — Progress Notes (Signed)
Mobility Specialist Progress Note:   04/18/23 1548  Mobility  Activity Ambulated with assistance in hallway  Level of Assistance Contact guard assist, steadying assist  Assistive Device None  Distance Ambulated (ft) 225 ft  Activity Response Tolerated well  Mobility Referral Yes  $Mobility charge 1 Mobility  Mobility Specialist Start Time (ACUTE ONLY) 1535  Mobility Specialist Stop Time (ACUTE ONLY) 1545  Mobility Specialist Time Calculation (min) (ACUTE ONLY) 10 min   Pre Mobility: 81 HR During Mobility: 145 HR  Post Mobility: 78 HR   Pt received in bed, agreeable to mobility. Groin incision sites stable. Independent to stand. CG during ambulation for safety. Elevated HR during ambulation with peak of 145 bpm. Pt denied any discomfort, asymptomatic throughout. Pt returned to bed with call bell in reach and all needs met. RN notified.   Leory Plowman  Mobility Specialist Please contact via [employer] office at [phone]

## 2023-04-18 NOTE — Op Note (Signed)
[facility]   MULTIDISCIPLINARY HEART VALVE TEAM   TAVR OPERATIVE NOTE   Date of Procedure:  04/18/2023  Preoperative Diagnosis: Severe Aortic Stenosis   Postoperative Diagnosis: Same   Procedure:   Transcatheter Aortic Valve Replacement - Percutaneous Left Transfemoral Approach  Edwards Sapien 3 Ultra Resilia THV (size 26 mm, model # 9755RSL, serial # [id number])   Co-Surgeons:  Alleen Borne, MD and [name], MD   Anesthesiologist:  G. Stoltzfus, DO  Echocardiographer:  P. Eden Emms, MD  Pre-operative Echo Findings: Severe aortic stenosis Normal left ventricular systolic function  Post-operative Echo Findings: No paravalvular leak Normal left ventricular systolic function   BRIEF CLINICAL NOTE AND INDICATIONS FOR SURGERY  This 89 year old woman has stage D3, severe, symptomatic low-flow/low gradient aortic stenosis with NYHA class II symptoms of progressive exertional fatigue and shortness of breath as well as lightheadedness consistent with chronic diastolic congestive heart failure.  I have personally reviewed her 2D echocardiogram, cardiac catheterization, and CTA studies.  Her echocardiogram shows a severely thickened and calcified aortic valve with a mean gradient of 25 mmHg and a valve area of 0.61 cm by VTI.  Left ventricular ejection fraction is normal with a low stroke-volume index of 24 and dimensionless index of 0.15 consistent with low-flow/low gradient severe aortic stenosis.  Her cardiac catheterization shows mild nonobstructive disease.  I agree that aortic valve replacement is indicated in this active patient for relief of her symptoms to improve her quality of life and to prevent progressive left ventricular deterioration.  Given her advanced age I think transcatheter aortic valve replacement would be the best treatment for her.  Her gated cardiac CTA shows anatomy suitable for TAVR using a 26 mm SAPIEN 3 valve.  Her abdominal and pelvic CTA  shows adequate pelvic vascular anatomy to allow transfemoral insertion.   The patient and her family were counseled at length regarding treatment alternatives for management of severe symptomatic aortic stenosis. The risks and benefits of surgical intervention has been discussed in detail. Long-term prognosis with medical therapy was discussed. Alternative approaches such as conventional surgical aortic valve replacement, transcatheter aortic valve replacement, and palliative medical therapy were compared and contrasted at length. This discussion was placed in the context of the patient's own specific clinical presentation and past medical history. All of their questions have been addressed.    Following the decision to proceed with transcatheter aortic valve replacement, a discussion was held regarding what types of management strategies would be attempted intraoperatively in the event of life-threatening complications, including whether or not the patient would be considered a candidate for the use of cardiopulmonary bypass and/or conversion to open sternotomy for attempted surgical intervention.  She is still very active and independent at 89 years old and I think she would be a candidate for emergent sternotomy to manage any intraoperative complications.  The patient has been advised of a variety of complications that might develop including but not limited to risks of death, stroke, paravalvular leak, aortic dissection or other major vascular complications, aortic annulus rupture, device embolization, cardiac rupture or perforation, mitral regurgitation, acute myocardial infarction, arrhythmia, heart block or bradycardia requiring permanent pacemaker placement, congestive heart failure, respiratory failure, renal failure, pneumonia, infection, other late complications related to structural valve deterioration or migration, or other complications that might ultimately cause a temporary or permanent loss of  functional independence or other long term morbidity. The patient provides full informed consent for the procedure as described and all questions were answered.  DETAILS OF THE OPERATIVE PROCEDURE  PREPARATION:    The patient was brought to the operating room on the above mentioned date and appropriate monitoring was established by the anesthesia team. The patient was placed in the supine position on the operating table.  Intravenous antibiotics were administered. The patient was monitored closely throughout the procedure under conscious sedation.    Baseline transthoracic echocardiogram was performed. The patient's abdomen and both groins were prepped and draped in a sterile manner. A time out procedure was performed.   PERIPHERAL ACCESS:    Using the modified Seldinger technique, femoral arterial and venous access was obtained with placement of 6 Fr sheaths on the right side.  A pigtail diagnostic catheter was passed through the right arterial sheath under fluoroscopic guidance into the aortic root.  A temporary transvenous pacemaker catheter was passed through the right femoral venous sheath under fluoroscopic guidance into the right ventricle.  The pacemaker was tested to ensure stable lead placement and pacemaker capture. Aortic root angiography was performed in order to determine the optimal angiographic angle for valve deployment.   TRANSFEMORAL ACCESS:   Percutaneous transfemoral access and sheath placement was performed using ultrasound guidance.  The left common femoral artery was cannulated using a micropuncture needle and appropriate location was verified using hand injection angiogram.  A pair of Abbott Perclose percutaneous closure devices were placed and a 6 French sheath replaced into the femoral artery.  The patient was heparinized systemically and ACT verified > 250 seconds.    A 14 Fr transfemoral E-sheath was introduced into the left common femoral artery after  progressively dilating over an Amplatz superstiff wire. An AL-1 catheter was used to direct a straight-tip exchange length wire across the native aortic valve into the left ventricle. This was exchanged out for a pigtail catheter and position was confirmed in the LV apex. Simultaneous LV and Ao pressures were recorded.  The pigtail catheter was exchanged for a Safari wire in the LV apex.   BALLOON AORTIC VALVULOPLASTY:   Not performed   TRANSCATHETER HEART VALVE DEPLOYMENT:   An Edwards Sapien 3 Ultra transcatheter heart valve (size 26 mm) was prepared and crimped per manufacturer's guidelines, and the proper orientation of the valve is confirmed on the [employer] delivery system. The valve was advanced through the introducer sheath using normal technique until in an appropriate position in the abdominal aorta beyond the sheath tip. The balloon was then retracted and using the fine-tuning wheel was centered on the valve. The valve was then advanced across the aortic arch using appropriate flexion of the catheter. The valve was carefully positioned across the aortic valve annulus. The Commander catheter was retracted using normal technique. Once final position of the valve has been confirmed by angiographic assessment, the valve is deployed during rapid ventricular pacing to maintain systolic blood pressure < 50 mmHg and pulse pressure < 10 mmHg. The balloon inflation is held for >3 seconds after reaching full deployment volume. Once the balloon has fully deflated the balloon is retracted into the ascending aorta and valve function is assessed using echocardiography. There is felt to be no paravalvular leak and no central aortic insufficiency.  The patient's hemodynamic recovery following valve deployment is good.  The deployment balloon and guidewire are both removed.    PROCEDURE COMPLETION:   The sheath was removed and femoral artery closure performed.  Protamine was administered once femoral  arterial repair was complete. The temporary pacemaker, pigtail catheter and femoral sheaths were removed with  manual pressure used for venous hemostasis.  A Mynx femoral closure device was utilized following removal of the diagnostic sheath in the right femoral artery.  The patient tolerated the procedure well and is transported to the cath lab recovery area in stable condition. There were no immediate intraoperative complications. All sponge instrument and needle counts are verified correct at completion of the operation.   No blood products were administered during the operation.  The patient received a total of 30 mL of intravenous contrast during the procedure.   Alleen Borne, MD 04/18/2023 9:27 AM

## 2023-04-18 NOTE — Progress Notes (Signed)
  Echocardiogram 2D Echocardiogram has been performed.  Janalyn Harder 04/18/2023, 8:45 AM

## 2023-04-18 NOTE — Progress Notes (Signed)
  [facility]   MULTIDISCIPLINARY HEART VALVE TEAM  Patient doing well s/p TAVR. She is hemodynamically stable. Groin sites stable. ECG with afib with new IVCD in LBBB pattern but no high grade block. Transferred from cath lab holding to 4E. K is 3.0. I will give her of Kdur now. Early ambulation after bedrest completed and hopeful discharge over the next 24-48 hours.   Cline Crock PA-C  [facility]  Pager [phone]

## 2023-04-18 NOTE — Op Note (Signed)
[facility]ART AND VASCULAR [facility]ENTER   MULTIDISCIPLINARY HEART VALVE TEAM   TAVR OPERATIVE NOTE   Date of Procedure:  04/18/2023  Preoperative Diagnosis: Severe Aortic Stenosis   Postoperative Diagnosis: Same   Procedure:   Transcatheter Aortic Valve Replacement - Percutaneous Transfemoral Approach  Edwards Sapien 3 Ultra Resilia THV (size 26 mm, serial # [id number] )   Co-Surgeons:  Evelene Croon, MD and [name], MD  Anesthesiologist:  Gavin Potters, DO  Echocardiographer:  Charlton Haws, MD  Pre-operative Echo Findings: Severe aortic stenosis Normal left ventricular systolic function  Post-operative Echo Findings: No paravalvular leak Normal/unchanged left ventricular systolic function  BRIEF CLINICAL NOTE AND INDICATIONS FOR SURGERY  The patient is followed for permanent atrial fibrillation, aortic stenosis, and moderate mitral regurgitation. The patient had a recent echocardiogram demonstrating progression of her aortic stenosis with findings consistent with severe paradoxical low-flow low gradient aortic stenosis. Her peak and mean transvalvular gradients are 41 and 25 mmHg, respectively. The dimensionless index is 0.15 and the stroke-volume index is 24. LVEF 52% and calculated aortic valve area is 0.61 cm. She has progressive fatigue and dyspnea with an NYHA Class 2 functional limitation.   During the course of the patient's preoperative work up they have been evaluated comprehensively by a multidisciplinary team of specialists coordinated through the Multidisciplinary Heart Valve Clinic in the [facility].  They have been demonstrated to suffer from symptomatic severe aortic stenosis as noted above. The patient has been counseled extensively as to the relative risks and benefits of all options for the treatment of severe aortic stenosis including long term medical therapy, conventional surgery for aortic valve replacement, and transcatheter aortic  valve replacement.  The patient has been independently evaluated in formal cardiac surgical consultation by Dr Laneta Simmers, who deemed the patient appropriate for TAVR. Based upon review of all of the patient's preoperative diagnostic tests they are felt to be candidate for transcatheter aortic valve replacement using the transfemoral approach as an alternative to conventional surgery.    Following the decision to proceed with transcatheter aortic valve replacement, a discussion has been held regarding what types of management strategies would be attempted intraoperatively in the event of life-threatening complications, including whether or not the patient would be considered a candidate for the use of cardiopulmonary bypass and/or conversion to open sternotomy for attempted surgical intervention.  The patient has been advised of a variety of complications that might develop peculiar to this approach including but not limited to risks of death, stroke, paravalvular leak, aortic dissection or other major vascular complications, aortic annulus rupture, device embolization, cardiac rupture or perforation, acute myocardial infarction, arrhythmia, heart block or bradycardia requiring permanent pacemaker placement, congestive heart failure, respiratory failure, renal failure, pneumonia, infection, other late complications related to structural valve deterioration or migration, or other complications that might ultimately cause a temporary or permanent loss of functional independence or other long term morbidity.  The patient provides full informed consent for the procedure as described and all questions were answered preoperatively.  DETAILS OF THE OPERATIVE PROCEDURE  PREPARATION:   The patient is brought to the operating room on the above mentioned date and central monitoring was established by the anesthesia team including placement of a radial arterial line. The patient is placed in the supine position on the  operating table.  Intravenous antibiotics are administered. The patient is monitored closely throughout the procedure under conscious sedation.  Baseline transthoracic echocardiogram is performed. The patient's chest, abdomen, both groins, and  both lower extremities are prepared and draped in a sterile manner. A time out procedure is performed.   PERIPHERAL ACCESS:   Using ultrasound guidance, femoral arterial and venous access is obtained with placement of 6 Fr sheaths on the right side.  [place] images are digitally captured and stored in the patient's chart. A pigtail diagnostic catheter was passed through the femoral arterial sheath under fluoroscopic guidance into the aortic root.  A temporary transvenous pacemaker catheter was passed through the femoral venous sheath under fluoroscopic guidance into the right ventricle.  The pacemaker was tested to ensure stable lead placement and pacemaker capture. Aortic root angiography was performed in order to determine the optimal angiographic angle for valve deployment.  TRANSFEMORAL ACCESS:  A micropuncture technique is used to access the left femoral artery under fluoroscopic and ultrasound guidance.  2 Perclose devices are deployed at 10' and 2' positions to 'PreClose' the femoral artery. An 8 French sheath is placed and then an Amplatz Superstiff wire is advanced through the sheath. This is changed out for a 14 French transfemoral E-Sheath after progressively dilating over the Superstiff wire.  An AL-2 catheter was used to direct a straight-tip exchange length wire across the native aortic valve into the left ventricle. This was exchanged out for a pigtail catheter and position was confirmed in the LV apex. Simultaneous LV and Ao pressures were recorded.  The pigtail catheter was exchanged for a Safari wire in the LV apex.    BALLOON AORTIC VALVULOPLASTY:  Not performed  TRANSCATHETER HEART VALVE DEPLOYMENT:  An Edwards Sapien 3 Ultra Resilia transcatheter  heart valve (size 26 mm) was prepared and crimped per manufacturer's guidelines, and the proper orientation of the valve is confirmed on the [employer] delivery system. The valve was advanced through the introducer sheath using normal technique until in an appropriate position in the abdominal aorta beyond the sheath tip. The balloon was then retracted and using the fine-tuning wheel was centered on the valve. The valve was then advanced across the aortic arch using appropriate flexion of the catheter. The valve was carefully positioned across the aortic valve annulus. The Commander catheter was retracted using normal technique. Once final position of the valve has been confirmed by angiographic assessment, the valve is deployed while temporarily holding ventilation and during rapid ventricular pacing to maintain systolic blood pressure < 50 mmHg and pulse pressure < 10 mmHg. The balloon inflation is held for >3 seconds after reaching full deployment volume. Once the balloon has fully deflated the balloon is retracted into the ascending aorta and valve function is assessed using echocardiography. The patient's hemodynamic recovery following valve deployment is good.  The deployment balloon and guidewire are both removed. Echo demostrated acceptable post-procedural gradients, stable mitral valve function, and no aortic insufficiency.    PROCEDURE COMPLETION:  The sheath was removed and femoral artery closure is performed using the 2 previously deployed Perclose devices.  Protamine is administered once femoral arterial repair was complete. The site is clear with no evidence of bleeding or hematoma after the sutures are tightened. The temporary pacemaker and pigtail catheters are removed. Mynx closure  is used for contralateral femoral arterial hemostasis for the 6 Fr sheath.  The patient tolerated the procedure well and is transported to the recovery area in stable condition. There were no immediate  intraoperative complications. All sponge instrument and needle counts are verified correct at completion of the operation.   The patient received a total of 30 mL of intravenous contrast  during the procedure.  EBL: minimal  [name], MD 04/18/2023 9:16 AM

## 2023-04-18 NOTE — Interval H&P Note (Signed)
History and Physical Interval Note:  04/18/2023 6:28 AM  Judy Walker  has presented today for surgery, with the diagnosis of Severe Aortic Stenosis.  The various methods of treatment have been discussed with the patient and family. After consideration of risks, benefits and other options for treatment, the patient has consented to  Procedure(s): Transcatheter Aortic Valve Replacement, Transfemoral (N/A) INTRAOPERATIVE TRANSTHORACIC ECHOCARDIOGRAM (N/A) as a surgical intervention.  The patient's history has been reviewed, patient examined, no change in status, stable for surgery.  I have reviewed the patient's chart and labs.  Questions were answered to the patient's satisfaction.     Alleen Borne

## 2023-04-18 NOTE — Discharge Summary (Addendum)
HE[facility] VASCULAR CENTER   MULTIDISCIPLINARY HEART VALVE TEAM  Discharge Summary    Patient ID: Judy Walker MRN: [id number]; DOB: [birth year]  Admit date: 04/18/2023 Discharge date: 04/19/2023  Primary Care Provider: [name], MD  Primary Cardiologist: [name], MD / Dr. [name] & Dr. Laneta Simmers (TAVR)  Discharge Diagnoses    Principal Problem:   S/P TAVR (transcatheter aortic valve replacement) Active Problems:   Hypothyroidism   Essential hypertension   Raynaud phenomenon   Permanent atrial fibrillation (HCC)   Chronic anticoagulation   CKD (chronic kidney disease) stage 3, GFR 30-59 ml/min (HCC)   Pure hypercholesterolemia   Severe aortic stenosis   Allergies No Known Allergies  Diagnostic Studies/Procedures    TAVR OPERATIVE NOTE     Date of Procedure:                04/18/2023   Preoperative Diagnosis:      Severe Aortic Stenosis    Postoperative Diagnosis:    Same    Procedure:        Transcatheter Aortic Valve Replacement - Percutaneous Transfemoral Approach             Edwards Sapien 3 Ultra Resilia THV (size 26 mm, serial # [id number] )              Co-Surgeons:                        Evelene Croon, MD and [name], MD   Anesthesiologist:                  Gavin Potters, DO   Echocardiographer:              Charlton Haws, MD   Pre-operative Echo Findings: Severe aortic stenosis Normal left ventricular systolic function   Post-operative Echo Findings: No paravalvular leak Normal/unchanged left ventricular systolic function  _____________    Echo 04/19/23: completed but pending formal read at the time of discharge   History of Present Illness     Judy Walker is a 89 y.o. female with a history of HTN, HLD, permanent atrial fibrillation on Eliquis, mod MR, and severe paradoxical LFLG AS who presented to [facility] on 04/18/23 for planned TAVR.   The patient has been followed over time for her aortic valve disease. She recently  developed progressive fatigue and shortness of breath with physical activity. Echo 02/21/23 showed EF 55%, and severe paradoxical LGLG AS with a mean grad 25 mmHg, AVA 0.61 cm2, DVI 0.15, SVI 24 and moderate MR. [facility] 03/30/23 showed patent coronary arteries with mild nonobstructive plaquing in the mid RCA and minimal irregularities in the other vessels as well as normal right heart hemodynamics and preserved cardiac output of 5.2 L/m.  The patient was evaluated by the multidisciplinary valve team and felt to have severe, symptomatic aortic stenosis and to be a suitable candidate for TAVR, which was set up for 04/18/23.  Hospital Course     Consultants: none   Severe AS: s/p successful TAVR with a 26 mm Edwards Sapien 3 Ultra Resilia THV via the TF approach on 04/18/23. Post operative echo completed but pending formal read. Groin sites are stable. ECG with chronic afib and no high grade heart block. Resumed on home Eliquis 5mg  BID. Walked with cardiac rehab with no issues. Plan for discharge home today with close follow up in the outpatient setting.   Permanent atrial fibrillation: rate well controlled on Cardizem CD  240 mg daily. Resumed on home Eliquis 5mg  BID.   Mitral regurgitation: has been moderate. Looks much improved on echo today, but pending formal read.   HTN: BP well controlled. Resumed on home meds  Thickening of gallbladder: pre TAVR CTs showed "focal wall thickening of the neck of the gallbladder, could be due to adenomyomatosis although neoplasm can not be excluded, however recommend further evaluation with right upper quadrant ultrasound." This will be discussed in the outpatient setting.   Pulmonary nodules: pre TAVR CTs showed "small bilateral solid pulmonary nodules measuring up to 3 mm. No follow-up needed if patient is low-risk." This will be discussed in the outpatient setting.   ____________  Discharge Vitals Blood pressure (!) 140/72, pulse 91, temperature 98.2 F (36.8  C), temperature source Oral, resp. rate 18, height 5\' 6"  (1.676 m), weight 72.8 kg, SpO2 92%.  Filed Weights   04/18/23 0548 04/19/23 0336  Weight: 72.1 kg 72.8 kg     GEN: Well nourished, well developed, in no acute distress HEENT: normal Neck: no JVD or masses Cardiac irreg irreg; no murmurs, rubs, or gallops,no edema  Respiratory:  clear to auscultation bilaterally, normal work of breathing GI: soft, nontender, nondistended, + BS MS: no deformity or atrophy Skin: warm and dry, no rash. Groin sites clear without hematoma or ecchymosis  Neuro:  Alert and Oriented x 3, Strength and sensation are intact Psych: euthymic mood, full affect   Disposition   Pt is being discharged home today in good condition.  Follow-up Plans & Appointments     Follow-up Information     Janetta Hora, PA-C Follow up on 04/28/2023.   Specialties: Cardiology, Radiology Why: @9am , please arrive at least 10 minutes early. Contact information: [address] [place] [address] [phone]                Discharge Instructions     Amb Referral to Cardiac Rehabilitation   Complete by: As directed    Diagnosis: Valve Replacement   Valve: Aortic Comment - TAVR   After initial evaluation and assessments completed: Virtual Based Care may be provided alone or in conjunction with Phase 2 Cardiac Rehab based on patient barriers.: Yes   Intensive Cardiac Rehabilitation (ICR) [facility] location only OR Traditional Cardiac Rehabilitation (TCR) *If criteria for ICR are not met will enroll in TCR [facility] only): Yes       Discharge Medications   Allergies as of 04/19/2023   No Known Allergies      Medication List     TAKE these medications    atorvastatin 20 MG tablet Commonly known as: LIPITOR Take 1 tablet (20 mg total) by mouth daily. What changed: when to take this   CALCIUM 500 PO Take 500 mg by mouth daily.   diltiazem 240 MG 24 hr capsule Commonly known as:  CARDIZEM CD TAKE 1 CAPSULE BY MOUTH EVERY DAY   diphenhydrAMINE 25 mg capsule Commonly known as: BENADRYL Take 25 mg by mouth at bedtime. Sleep   Eliquis 5 MG Tabs tablet Generic drug: apixaban TAKE 1 TABLET BY MOUTH TWICE A DAY   hydrochlorothiazide 25 MG tablet Commonly known as: HYDRODIURIL Take 1 tablet (25 mg total) by mouth daily.   levothyroxine 75 MCG tablet Commonly known as: SYNTHROID Take 1 tablet (75 mcg total) by mouth daily.   Multivitamin Adult Tabs Take 1 tablet by mouth daily.   pantoprazole 40 MG tablet Commonly known as: PROTONIX Take 1 tablet (40 mg  total) by mouth daily. What changed: when to take this   potassium gluconate 595 (99 K) MG Tabs tablet Take 595 mg by mouth in the morning.   valsartan 320 MG tablet Commonly known as: DIOVAN Take 1 tablet (320 mg total) by mouth daily.   Vitamin C Gummie 120 MG Chew Generic drug: Ascorbic Acid Chew 250 mg by mouth daily.        Outstanding Labs/Studies   none  Duration of Discharge Encounter   Greater than 30 minutes including physician time.  SignedCline Crock, PA-C 04/19/2023, 9:30 AM [phone]

## 2023-04-18 NOTE — Anesthesia Postprocedure Evaluation (Signed)
Anesthesia Post Note  Patient: CHARNELL BRIGHAM  Procedure(s) Performed: Transcatheter Aortic Valve Replacement, Transfemoral INTRAOPERATIVE TRANSTHORACIC ECHOCARDIOGRAM     Patient location during evaluation: PACU Anesthesia Type: MAC Level of consciousness: awake and alert Pain management: pain level controlled Vital Signs Assessment: post-procedure vital signs reviewed and stable Respiratory status: spontaneous breathing, nonlabored ventilation, respiratory function stable and patient connected to nasal cannula oxygen Cardiovascular status: stable and blood pressure returned to baseline Postop Assessment: no apparent nausea or vomiting Anesthetic complications: no   There were no known notable events for this encounter.  Last Vitals:  Vitals:   04/18/23 1015 04/18/23 1041  BP: 126/64 113/72  Pulse: 64 77  Resp: 19   Temp:  (!) 36.3 C  SpO2: 92% 97%    Last Pain:  Vitals:   04/18/23 1041  TempSrc: Oral  PainSc:                  Nelle Don Avayah Raffety

## 2023-04-18 NOTE — Transfer of Care (Signed)
Immediate Anesthesia Transfer of Care Note  Patient: Judy Walker  Procedure(s) Performed: Transcatheter Aortic Valve Replacement, Transfemoral INTRAOPERATIVE TRANSTHORACIC ECHOCARDIOGRAM  Patient Location: PACU and Cath Lab  Anesthesia Type:MAC  Level of Consciousness: awake, drowsy, patient cooperative, and responds to stimulation  Airway & Oxygen Therapy: Patient Spontanous Breathing and Patient connected to nasal cannula oxygen  Post-op Assessment: Report given to RN and Post -op Vital signs reviewed and stable  Post vital signs: Reviewed and stable  Last Vitals:  Vitals Value Taken Time  BP 108/55 04/18/23 0858  Temp    Pulse 71 04/18/23 0900  Resp 17 04/18/23 0900  SpO2 94 % 04/18/23 0900  Vitals shown include unfiled device data.  Last Pain:  Vitals:   04/18/23 0602  TempSrc:   PainSc: 0-No pain         Complications: There were no known notable events for this encounter.

## 2023-04-18 NOTE — Discharge Instructions (Signed)
ACTIVITY AND EXERCISE  Daily activity and exercise are an important part of your recovery. People recover at different rates depending on their general health and type of valve procedure.  Most people recovering from TAVR feel better relatively quickly   No lifting, pushing, pulling more than 10 pounds (examples to avoid: groceries, vacuuming, gardening, golfing):             - For one week with a procedure through the groin.             - For six weeks for procedures through the chest wall or neck. NOTE: You will typically see one of our providers 7-14 days after your procedure to discuss WHEN TO RESUME the above activities.      DRIVING  Do not drive until you are seen for follow up and cleared by a provider. Generally, we ask patient to not drive for 1 week after their procedure.  If you have been told by your doctor in the past that you may not drive, you must talk with him/her before you begin driving again.   DRESSING  Groin site: you may leave the clear dressing over the site for up to one week or until it falls off.   HYGIENE  If you had a femoral (leg) procedure, you may take a shower when you return home. After the shower, pat the site dry. Do NOT use powder, oils or lotions in your groin area until the site has completely healed.  If you had a chest procedure, you may shower when you return home unless specifically instructed not to by your discharging practitioner.             - DO NOT scrub incision; pat dry with a towel.             - DO NOT apply any lotions, oils, powders to the incision.             - No tub baths / swimming for at least 2 weeks.  If you notice any fevers, chills, increased pain, swelling, bleeding or pus, please contact your doctor.   ADDITIONAL INFORMATION  If you are going to have an upcoming dental procedure, please contact our office as you will require antibiotics ahead of time to prevent infection on your heart valve.    If you have any questions  or concerns you can call the structural heart phone during normal business hours 8am-4pm. If you have an urgent need after hours or weekends please call [phone] to talk to the on call provider for general cardiology. If you have an emergency that requires immediate attention, please call 911.    After TAVR Checklist  Check  Test Description   Follow up appointment in 1-2 weeks  You will see our structural heart advanced practice provider. Your incision sites will be checked and you will be cleared to drive and resume all normal activities if you are doing well.     1 month echo and follow up  You will have an echo to check on your new heart valve and be seen back in the office by a structural heart advanced practice provider.   Follow up with your primary cardiologist You will need to be seen by your primary cardiologist in the following 3-6 months after your 1 month appointment in the valve clinic.    1 year echo and follow up You will have another echo to check on your heart valve after 1 year   and be seen back in the office by a structural heart advanced practice provider. This your last structural heart visit.   Bacterial endocarditis prophylaxis  You will have to take antibiotics for the rest of your life before all dental procedures (even teeth cleanings) to protect your heart valve. Antibiotics are also required before some surgeries. Please check with your cardiologist before scheduling any surgeries. Also, please make sure to tell us if you have a penicillin allergy as you will require an alternative antibiotic.   ' =======================================  Information on my medicine - ELIQUIS (apixaban)  This medication education was reviewed with me or my healthcare representative as part of my discharge preparation.     Why was Eliquis prescribed for you? Eliquis was prescribed for you to reduce the risk of a blood clot forming that can cause a stroke if you have a medical  condition called atrial fibrillation (a type of irregular heartbeat).  What do You need to know about Eliquis ? Take your Eliquis TWICE DAILY - one tablet in the morning and one tablet in the evening with or without food. If you have difficulty swallowing the tablet whole please discuss with your pharmacist how to take the medication safely.  Take Eliquis exactly as prescribed by your doctor and DO NOT stop taking Eliquis without talking to the doctor who prescribed the medication.  Stopping may increase your risk of developing a stroke.  Refill your prescription before you run out.  After discharge, you should have regular check-up appointments with your healthcare provider that is prescribing your Eliquis.  In the future your dose may need to be changed if your kidney function or weight changes by a significant amount or as you get older.  What do you do if you miss a dose? If you miss a dose, take it as soon as you remember on the same day and resume taking twice daily.  Do not take more than one dose of ELIQUIS at the same time to make up a missed dose.  Important Safety Information A possible side effect of Eliquis is bleeding. You should call your healthcare provider right away if you experience any of the following: Bleeding from an injury or your nose that does not stop. Unusual colored urine (red or dark brown) or unusual colored stools (red or black). Unusual bruising for unknown reasons. A serious fall or if you hit your head (even if there is no bleeding).  Some medicines may interact with Eliquis and might increase your risk of bleeding or clotting while on Eliquis. To help avoid this, consult your healthcare provider or pharmacist prior to using any new prescription or non-prescription medications, including herbals, vitamins, non-steroidal anti-inflammatory drugs (NSAIDs) and supplements.  This website has more information on Eliquis (apixaban):  http://www.eliquis.com/eliquis/home

## 2023-04-18 NOTE — Plan of Care (Signed)
  Problem: Clinical Measurements: Goal: Will remain free from infection Outcome: Progressing Goal: Cardiovascular complication will be avoided Outcome: Progressing   Problem: Activity: Goal: Risk for activity intolerance will decrease Outcome: Progressing   Problem: Nutrition: Goal: Adequate nutrition will be maintained Outcome: Progressing   

## 2023-04-19 ENCOUNTER — Inpatient Hospital Stay (HOSPITAL_COMMUNITY): Payer: Medicare Other

## 2023-04-19 DIAGNOSIS — N183 Chronic kidney disease, stage 3 unspecified: Secondary | ICD-10-CM | POA: Diagnosis not present

## 2023-04-19 DIAGNOSIS — Z952 Presence of prosthetic heart valve: Secondary | ICD-10-CM | POA: Diagnosis not present

## 2023-04-19 DIAGNOSIS — I4821 Permanent atrial fibrillation: Secondary | ICD-10-CM | POA: Diagnosis not present

## 2023-04-19 DIAGNOSIS — Z006 Encounter for examination for normal comparison and control in clinical research program: Secondary | ICD-10-CM | POA: Diagnosis not present

## 2023-04-19 DIAGNOSIS — I350 Nonrheumatic aortic (valve) stenosis: Secondary | ICD-10-CM

## 2023-04-19 DIAGNOSIS — Z954 Presence of other heart-valve replacement: Secondary | ICD-10-CM | POA: Diagnosis not present

## 2023-04-19 LAB — CBC
HCT: 33.8 % — ABNORMAL LOW (ref 36.0–46.0)
Hemoglobin: 11.6 g/dL — ABNORMAL LOW (ref 12.0–15.0)
MCH: 34.8 pg — ABNORMAL HIGH (ref 26.0–34.0)
MCHC: 34.3 g/dL (ref 30.0–36.0)
MCV: 101.5 fL — ABNORMAL HIGH (ref 80.0–100.0)
Platelets: 162 10*3/uL (ref 150–400)
RBC: 3.33 MIL/uL — ABNORMAL LOW (ref 3.87–5.11)
RDW: 12.1 % (ref 11.5–15.5)
WBC: 7.2 10*3/uL (ref 4.0–10.5)
nRBC: 0 % (ref 0.0–0.2)

## 2023-04-19 LAB — BASIC METABOLIC PANEL
Anion gap: 7 (ref 5–15)
BUN: 21 mg/dL (ref 8–23)
CO2: 25 mmol/L (ref 22–32)
Calcium: 8.6 mg/dL — ABNORMAL LOW (ref 8.9–10.3)
Chloride: 103 mmol/L (ref 98–111)
Creatinine, Ser: 1.11 mg/dL — ABNORMAL HIGH (ref 0.44–1.00)
GFR, Estimated: 48 mL/min — ABNORMAL LOW (ref >60)
Glucose, Bld: 117 mg/dL — ABNORMAL HIGH (ref 70–99)
Potassium: 3.8 mmol/L (ref 3.5–5.1)
Sodium: 135 mmol/L (ref 135–145)

## 2023-04-19 LAB — ECHOCARDIOGRAM COMPLETE
AR max vel: 2.88 cm2
AV Area VTI: 3.62 cm2
AV Area mean vel: 2.58 cm2
AV Mean grad: 4.5 mm[Hg]
AV Peak grad: 7.7 mm[Hg]
Ao pk vel: 1.39 m/s
Area-P 1/2: 3.63 cm2
Height: 66 in
S' Lateral: 2.6 cm
Weight: 2569.6 [oz_av]

## 2023-04-19 LAB — MAGNESIUM: Magnesium: 2 mg/dL (ref 1.7–2.4)

## 2023-04-19 NOTE — Progress Notes (Signed)
Pt ambulated yesterday and has been up in room. Will get up on her own. Discussed with pt and daughter restrictions, walking at home, and CRPII. Pt very receptive. Will refer to [place] CRPII.  [id number] Ethelda Chick BS, ACSM-CEP 04/19/2023 8:51 AM

## 2023-04-19 NOTE — Plan of Care (Signed)

## 2023-04-19 NOTE — Progress Notes (Signed)
Discharge instructions (including medications) discussed with and copy provided to patient/caregiver 

## 2023-04-19 NOTE — Progress Notes (Signed)
1 Day Post-Op Procedure(s) (LRB): Transcatheter Aortic Valve Replacement, Transfemoral (N/A) INTRAOPERATIVE TRANSTHORACIC ECHOCARDIOGRAM (N/A) Subjective: No complaints. Walked last night. Up to bathroom this am. Tired because it was hard to sleep here due to interruptions.   Objective: Vital signs in last 24 hours: Temp:  [96.6 F (35.9 C)-98.9 F (37.2 C)] 98.2 F (36.8 C) (11/13 0747) Pulse Rate:  [49-129] 91 (11/13 0747) Cardiac Rhythm: Atrial fibrillation (11/12 1900) Resp:  [14-30] 18 (11/13 0336) BP: (104-140)/(55-78) 140/72 (11/13 0747) SpO2:  [90 %-98 %] 92 % (11/13 0336) Weight:  [72.8 kg] 72.8 kg (11/13 0336)  Hemodynamic parameters for last 24 hours:    Intake/Output from previous day: 11/12 0701 - 11/13 0700 In: 1563.3 [P.O.:480; I.V.:883.3; IV Piggyback:200] Out: 10 [Blood:10] Intake/Output this shift: No intake/output data recorded.  General appearance: alert and cooperative Neurologic: intact Heart: irregular rate and rhythm Lungs: clear to auscultation bilaterally Extremities: warm, no edema Wound: groin sites look good.  Lab Results: Recent Labs    04/18/23 0848 04/19/23 0439  WBC  --  7.2  HGB 11.2* 11.6*  HCT 33.0* 33.8*  PLT  --  162   BMET:  Recent Labs    04/18/23 0848 04/19/23 0439  NA 139 135  K 3.0* 3.8  CL 101 103  CO2  --  25  GLUCOSE 117* 117*  BUN 27* 21  CREATININE 1.00 1.11*  CALCIUM  --  8.6*    PT/INR: No results for input(s): "LABPROT", "INR" in the last 72 hours. ABG    Component Value Date/Time   PHART 7.396 03/30/2023 1256   HCO3 24.2 03/30/2023 1256   TCO2 26 04/18/2023 0848   ACIDBASEDEF 1.0 03/30/2023 1256   O2SAT 95 03/30/2023 1256   CBG (last 3)  No results for input(s): "GLUCAP" in the last 72 hours.  ECG: atrial fib, no acute changes.  Assessment/Plan: S/P Procedure(s) (LRB): Transcatheter Aortic Valve Replacement, Transfemoral (N/A) INTRAOPERATIVE TRANSTHORACIC ECHOCARDIOGRAM  (N/A)  Hemodynamically stable in persistent atrial fib. Resume Eliquis and Cardizem for rate control.  2D echo this am.   Plan home with her family this am.   LOS: 1 day    Alleen Borne 04/19/2023

## 2023-04-20 ENCOUNTER — Telehealth: Payer: Self-pay | Admitting: Cardiology

## 2023-04-20 NOTE — Telephone Encounter (Signed)
  HEART AND VASCULAR CENTER   MULTIDISCIPLINARY HEART VALVE TEAM   Patient contacted regarding discharge from [facility] on 04/19/2023 s/p TAVR. Spoke with patients son who reports that she is doing very well with no issues. Groin sites with no bleeding or pain.    Patient understands to follow up with provider Carlean Jews, PA on 11/22 at 9am  Patient understands discharge instructions? Yes  Patient understands medications and regimen? Yes  Patient understands to bring all medications to this visit? Yes   Georgie Chard NP-C Structural Heart Team  Pager: [phone]

## 2023-04-21 ENCOUNTER — Ambulatory Visit: Payer: Medicare Other

## 2023-04-24 ENCOUNTER — Telehealth (HOSPITAL_COMMUNITY): Payer: Self-pay

## 2023-04-24 NOTE — Telephone Encounter (Signed)
Pt insurance is active and benefits verified through [facility]. Co-pay $0.00, DED $0.00/$0.00 met, out of pocket $3,600.00/$493.50 met, co-insurance 0%. No pre-authorization required. Passport, 04/24/23 @ 4:00PM, REF#[id number]   How many CR sessions are covered? (36 visits for TCR, 72 visits for ICR)72 Is this a lifetime maximum or an annual maximum? Annual Has the member used any of these services to date? No Is there a time limit (weeks/months) on start of program and/or program completion? No     Will contact patient to see if she is interested in the Cardiac Rehab Program. If interested, patient will need to complete follow up appt. Once completed, patient will be contacted for scheduling upon review by the RN Navigator.

## 2023-04-24 NOTE — Telephone Encounter (Signed)
Attempted to call patient in regards to Cardiac Rehab - LM on VM 

## 2023-04-25 ENCOUNTER — Telehealth (HOSPITAL_COMMUNITY): Payer: Self-pay

## 2023-04-25 NOTE — Progress Notes (Signed)
HEART AND VASCULAR CENTER   MULTIDISCIPLINARY HEART VALVE CLINIC                                     Cardiology Office Note:    Date:  04/28/2023   ID:  JETAUN STIRN, DOB 11/06/1932, MRN [id number]  PCP:  [name], MD  [facility] Cardiologist:  [name], MD  / Dr. [name] & Dr. Laneta Simmers (TAVR)  [facility] Electrophysiologist:  None   Referring MD: [name], MD   [facility] s/p TAVR  History of Present Illness:    Judy Walker is a 89 y.o. female with a hx of HTN, HLD, permanent atrial fibrillation on Eliquis, mod MR, and severe paradoxical LFLG AS s/p TAVR (04/18/23) who presents to clinic for follow up.  The patient has been followed over time for her aortic valve disease. She recently developed progressive fatigue and shortness of breath with physical activity. Echo 02/21/23 showed EF 55%, and severe paradoxical LGLG AS with a mean grad 25 mmHg, AVA 0.61 cm2, DVI 0.15, SVI 24 and moderate MR. [facility] 03/30/23 showed patent coronary arteries with mild nonobstructive plaquing in the mid RCA and minimal irregularities in the other vessels as well as normal right heart hemodynamics and preserved cardiac output of 5.2 L/m.   She underwent TAVR with a 26 mm Edwards Sapien 3 Ultra Resilia THV via the TF approach on 04/18/23. Post operative echo showed EF 65%, normally functioning TAVR with a mean gradient of 4.5 mmHg and no PVL. Resumed on home Eliquis 5mg  BID.   Today the patient presents to clinic for follow up. Here alone. No CP or SOB. No LE edema, orthopnea or PND. No dizziness or syncope. No blood in stool or urine. No palpitations. Looking forward to thanksgiving with 12 family members. Going to make the [place] and dressing.    Past Medical History:  Diagnosis Date   Anemia    Atrial fibrillation (HCC)    Benign positional vertigo 2005   Colonoscopy refused    " I elected not to have one" SOC reviewed 07/06/10; she continues to decline colonscopy   Heart  murmur    HLD (hyperlipidemia)    total cholesterol 340 in 2003, NHDL 61, TG 75. NMR Lipoprofile 2005: LDL 84 (933/285), LDL glad =<120 ideally <90. Framingham study LDL goal <130   HTN (hypertension)    Hypothyroidism    Osteopenia 08/2012, 09/2015   DEXA -1.9 (2014) -2.3 (2017)   S/P TAVR (transcatheter aortic valve replacement) 04/18/2023   s/p TAVR with a 26 mm Edwards S3UR via the TF approach by Dr. [name] and Dr. Laneta Simmers   Severe aortic stenosis      Current Medications: Current Meds  Medication Sig   amoxicillin (AMOXIL) 500 MG capsule Take 4 capsules (2,000 mg total) by mouth as needed. Take 4 capsules (2,000 mg total) prior to dental appointments   Ascorbic Acid (VITAMIN C GUMMIE) 120 MG CHEW Chew 250 mg by mouth daily.   atorvastatin (LIPITOR) 20 MG tablet Take 1 tablet (20 mg total) by mouth daily. (Patient taking differently: Take 20 mg by mouth at bedtime.)   Calcium Carbonate (CALCIUM 500 PO) Take 500 mg by mouth daily.   diltiazem (CARDIZEM CD) 240 MG 24 hr capsule TAKE 1 CAPSULE BY MOUTH EVERY DAY   diphenhydrAMINE (BENADRYL) 25 mg capsule Take 25 mg by mouth at bedtime. Sleep  ELIQUIS 5 MG TABS tablet TAKE 1 TABLET BY MOUTH TWICE A DAY   hydrochlorothiazide (HYDRODIURIL) 25 MG tablet Take 1 tablet (25 mg total) by mouth daily.   levothyroxine (SYNTHROID) 75 MCG tablet Take 1 tablet (75 mcg total) by mouth daily.   Multiple Vitamins-Minerals (MULTIVITAMIN ADULT) TABS Take 1 tablet by mouth daily.   pantoprazole (PROTONIX) 40 MG tablet Take 1 tablet (40 mg total) by mouth daily. (Patient taking differently: Take 40 mg by mouth at bedtime.)   potassium gluconate 595 (99 K) MG TABS tablet Take 595 mg by mouth in the morning.   valsartan (DIOVAN) 320 MG tablet Take 1 tablet (320 mg total) by mouth daily.      ROS:   Please see the history of present illness.    All other systems reviewed and are negative.  EKGs   EKG Interpretation Date/Time:  Friday April 28 2023  09:05:50 EST Ventricular Rate:  76 PR Interval:    QRS Duration:  78 QT Interval:  366 QTC Calculation: 411 R Axis:   -58  Text Interpretation: Atrial fibrillation Left axis deviation Low voltage QRS Septal infarct , age undetermined Septal infarct is now Present Nonspecific T wave abnormality no longer evident in Lateral leads Confirmed by Cline Crock [phone]) on 04/28/2023 9:11:19 AM   Risk Assessment/Calculations:    CHA2DS2-VASc Score = 6   This indicates a 9.7% annual risk of stroke. The patient's score is based upon: CHF History: 0 HTN History: 1 Diabetes History: 0 Stroke History: 2 Vascular Disease History: 0 Age Score: 2 Gender Score: 1          Physical Exam:    VS:  BP (!) 142/74   Pulse 95   Ht 5\' 6"  (1.676 m)   Wt 160 lb (72.6 kg)   SpO2 99%   BMI 25.82 kg/m     Wt Readings from Last 3 Encounters:  04/28/23 160 lb (72.6 kg)  04/19/23 160 lb 9.6 oz (72.8 kg)  04/05/23 161 lb (73 kg)     GEN: Well nourished, well developed in no acute distress NECK: No JVD CARDIAC: irreg irreg, no murmurs, rubs, gallops RESPIRATORY:  Clear to auscultation without rales, wheezing or rhonchi  ABDOMEN: Soft, non-tender, non-distended.   EXTREMITIES:  No edema; No deformity. Groin sites clear without hematoma or ecchymosis   ASSESSMENT:    1. S/P TAVR (transcatheter aortic valve replacement)   2. Permanent atrial fibrillation (HCC)   3. Mitral valve insufficiency, unspecified etiology   4. Essential hypertension   5. Thickening of wall of gallbladder   6. Pulmonary nodules     PLAN:    In order of problems listed above:  Severe AS s/p TAVR: doing great 1 week out from TAVR. Groin sites are stable. ECG with no HAVB. SBE prophylaxis discussed; I have RX'd amoxicillin. Continue Eliquis 5mg  BID. I will see her back for 1 month visit and echo.    Permanent atrial fibrillation: rate well controlled on Cardizem CD 240 mg daily. Continue Eliquis 5mg  BID.     Mitral regurgitation: improved to trivial on POD1 echo. No murmur on exam   HTN: BP elevated 142/74 initially but improved to 120/60 on my personal recheck. No changes made today.     Thickening of gallbladder: pre TAVR CTs showed "focal wall thickening of the neck of the gallbladder, could be due to adenomyomatosis although neoplasm can not be excluded, however recommend further evaluation with right upper quadrant ultrasound." This was discussed  today and [place] ordered.    Pulmonary nodules: pre TAVR CTs showed "small bilateral solid pulmonary nodules measuring up to 3 mm. No follow-up needed if patient is low-risk." She is low risk and no further work up is recommended.     Medication Adjustments/Labs and Tests Ordered: Current medicines are reviewed at length with the patient today.  Concerns regarding medicines are outlined above.  Orders Placed This Encounter  Procedures   US Abdomen Limited RUQ (LIVER/GB)   EKG 12-Lead   Meds ordered this encounter  Medications   amoxicillin (AMOXIL) 500 MG capsule    Sig: Take 4 capsules (2,000 mg total) by mouth as needed. Take 4 capsules (2,000 mg total) prior to dental appointments    Dispense:  16 capsule    Refill:  3    Patient Instructions  Medication Instructions:  Your physician has recommended you make the following change in your medication:  1) TAKE Amoxicillin 2,000 mg (4 capsules) prior to dental appointments  *If you need a refill on your cardiac medications before your next appointment, please call your pharmacy*  Testing/Procedures: Right Upper Quadrant Abdominal Ultrasound  Follow-Up: At [facility], you and your health needs are our priority.  As part of our continuing mission to provide you with exceptional heart care, we have created designated Provider Care Teams.  These Care Teams include your primary Cardiologist (physician) and Advanced Practice Providers (APPs -  Physician Assistants and Nurse  Practitioners) who all work together to provide you with the care you need, when you need it.   Your next appointment:   As scheduled     Signed, Cline Crock, PA-C  04/28/2023 10:12 AM    [facility]

## 2023-04-25 NOTE — Telephone Encounter (Signed)
Pt returned CR phone call and stated she is not interested in CR.   Closed referral

## 2023-04-26 ENCOUNTER — Ambulatory Visit: Payer: Medicare Other

## 2023-04-28 ENCOUNTER — Ambulatory Visit: Payer: Medicare Other | Attending: Cardiovascular Disease | Admitting: Physician Assistant

## 2023-04-28 ENCOUNTER — Ambulatory Visit: Payer: Medicare Other

## 2023-04-28 VITALS — BP 142/74 | HR 95 | Ht 66.0 in | Wt 160.0 lb

## 2023-04-28 DIAGNOSIS — R918 Other nonspecific abnormal finding of lung field: Secondary | ICD-10-CM

## 2023-04-28 DIAGNOSIS — I4821 Permanent atrial fibrillation: Secondary | ICD-10-CM | POA: Diagnosis not present

## 2023-04-28 DIAGNOSIS — Z952 Presence of prosthetic heart valve: Secondary | ICD-10-CM | POA: Diagnosis not present

## 2023-04-28 DIAGNOSIS — I340 Nonrheumatic mitral (valve) insufficiency: Secondary | ICD-10-CM

## 2023-04-28 DIAGNOSIS — I10 Essential (primary) hypertension: Secondary | ICD-10-CM

## 2023-04-28 DIAGNOSIS — K828 Other specified diseases of gallbladder: Secondary | ICD-10-CM

## 2023-04-28 MED ORDER — AMOXICILLIN 500 MG PO CAPS: 2000.0000 mg | ORAL_CAPSULE | ORAL | 3 refills | Status: AC | PRN

## 2023-04-28 NOTE — Patient Instructions (Addendum)
Medication Instructions:  Your physician has recommended you make the following change in your medication:  1) TAKE Amoxicillin 2,000 mg (4 capsules) prior to dental appointments  *If you need a refill on your cardiac medications before your next appointment, please call your pharmacy*  Testing/Procedures: Right Upper Quadrant Abdominal Ultrasound  Follow-Up: At [facility], you and your health needs are our priority.  As part of our continuing mission to provide you with exceptional heart care, we have created designated Provider Care Teams.  These Care Teams include your primary Cardiologist (physician) and Advanced Practice Providers (APPs -  Physician Assistants and Nurse Practitioners) who all work together to provide you with the care you need, when you need it.   Your next appointment:   As scheduled

## 2023-05-09 ENCOUNTER — Ambulatory Visit (HOSPITAL_COMMUNITY)
Admission: RE | Admit: 2023-05-09 | Discharge: 2023-05-09 | Disposition: A | Discharge status: Home / Self Care | Payer: Medicare Other | Source: Ambulatory Visit | Attending: Physician Assistant | Admitting: Physician Assistant

## 2023-05-09 DIAGNOSIS — K828 Other specified diseases of gallbladder: Secondary | ICD-10-CM | POA: Insufficient documentation

## 2023-05-12 ENCOUNTER — Other Ambulatory Visit: Payer: Self-pay | Admitting: Family Medicine

## 2023-05-12 DIAGNOSIS — I10 Essential (primary) hypertension: Secondary | ICD-10-CM

## 2023-05-17 NOTE — Progress Notes (Signed)
HEART AND VASCULAR CENTER   MULTIDISCIPLINARY HEART VALVE CLINIC                                     Cardiology Office Note:    Date:  05/19/2023   ID:  Judy Walker, DOB Sep 27, 1932, MRN [id number]  PCP:  [name], MD  [facility] Cardiologist:  [name], MD  / Dr. [name] & Dr. Laneta Simmers (TAVR)  [facility] Electrophysiologist:  None   Referring MD: [name], MD   1 month s/p TAVR  History of Present Illness:    Judy Walker is a 89 y.o. female with a hx of HTN, HLD, permanent atrial fibrillation on Eliquis, mod MR, and severe paradoxical LFLG AS s/p TAVR (04/18/23) who presents to clinic for follow up.  The patient has been followed over time for her aortic valve disease. She recently developed progressive fatigue and shortness of breath with physical activity. Echo 02/21/23 showed EF 55%, and severe paradoxical LGLG AS with a mean grad 25 mmHg, AVA 0.61 cm2, DVI 0.15, SVI 24 and moderate MR. [facility] 03/30/23 showed patent coronary arteries with mild nonobstructive plaquing in the mid RCA and minimal irregularities in the other vessels as well as normal right heart hemodynamics and preserved cardiac output of 5.2 L/m.   She underwent TAVR with a 26 mm Edwards Sapien 3 Ultra Resilia THV via the TF approach on 04/18/23. Post operative echo showed EF 65%, normally functioning TAVR with a mean gradient of 4.5 mmHg and no PVL. Resumed on home Eliquis 5mg  BID.   Today the patient presents to clinic for follow up. Here alone. No CP. Has had some mild SOB and fatigue but much improved since TAVR. No LE edema, orthopnea or PND. No dizziness or syncope. She previously had dizziness everyday prior TAVR but now its completely resolved. No blood in stool or urine. No palpitations. Recently hosted 14 people to Thanksgiving dinner and felt wonderful.    Past Medical History:  Diagnosis Date   Anemia    Atrial fibrillation (HCC)    Benign positional vertigo 2005    Colonoscopy refused    " I elected not to have one" SOC reviewed 07/06/10; she continues to decline colonscopy   Heart murmur    HLD (hyperlipidemia)    total cholesterol 340 in 2003, NHDL 61, TG 75. NMR Lipoprofile 2005: LDL 84 (933/285), LDL glad =<120 ideally <90. Framingham study LDL goal <130   HTN (hypertension)    Hypothyroidism    Osteopenia 08/2012, 09/2015   DEXA -1.9 (2014) -2.3 (2017)   S/P TAVR (transcatheter aortic valve replacement) 04/18/2023   s/p TAVR with a 26 mm Edwards S3UR via the TF approach by Dr. [name] and Dr. Laneta Simmers   Severe aortic stenosis      Current Medications: Current Meds  Medication Sig   amoxicillin (AMOXIL) 500 MG capsule Take 4 capsules (2,000 mg total) by mouth as needed. Take 4 capsules (2,000 mg total) prior to dental appointments   Ascorbic Acid (VITAMIN C GUMMIE) 120 MG CHEW Chew 250 mg by mouth daily.   atorvastatin (LIPITOR) 20 MG tablet Take 1 tablet (20 mg total) by mouth daily. (Patient taking differently: Take 20 mg by mouth at bedtime.)   Calcium Carbonate (CALCIUM 500 PO) Take 500 mg by mouth daily.   diltiazem (CARDIZEM CD) 240 MG 24 hr capsule TAKE 1 CAPSULE BY MOUTH EVERY  DAY   diphenhydrAMINE (BENADRYL) 25 mg capsule Take 25 mg by mouth at bedtime. Sleep   ELIQUIS 5 MG TABS tablet TAKE 1 TABLET BY MOUTH TWICE A DAY   hydrochlorothiazide (HYDRODIURIL) 25 MG tablet Take 1 tablet (25 mg total) by mouth daily.   levothyroxine (SYNTHROID) 75 MCG tablet Take 1 tablet (75 mcg total) by mouth daily.   Multiple Vitamins-Minerals (MULTIVITAMIN ADULT) TABS Take 1 tablet by mouth daily.   pantoprazole (PROTONIX) 40 MG tablet Take 1 tablet (40 mg total) by mouth daily. (Patient taking differently: Take 40 mg by mouth at bedtime.)   potassium gluconate 595 (99 K) MG TABS tablet Take 595 mg by mouth in the morning.   valsartan (DIOVAN) 320 MG tablet TAKE 1 TABLET BY MOUTH EVERY DAY      ROS:   Please see the history of present illness.    All  other systems reviewed and are negative.  EKGs       Risk Assessment/Calculations:    CHA2DS2-VASc Score = 6   This indicates a 9.7% annual risk of stroke. The patient's score is based upon: CHF History: 0 HTN History: 1 Diabetes History: 0 Stroke History: 2 Vascular Disease History: 0 Age Score: 2 Gender Score: 1          Physical Exam:    VS:  BP 128/70   Pulse 73   Ht 5\' 6"  (1.676 m)   Wt 160 lb (72.6 kg)   SpO2 98%   BMI 25.82 kg/m     Wt Readings from Last 3 Encounters:  05/19/23 160 lb (72.6 kg)  04/28/23 160 lb (72.6 kg)  04/19/23 160 lb 9.6 oz (72.8 kg)     GEN: Well nourished, well developed in no acute distress NECK: No JVD CARDIAC: irreg irreg, no murmurs, rubs, gallops RESPIRATORY:  Clear to auscultation without rales, wheezing or rhonchi  ABDOMEN: Soft, non-tender, non-distended.   EXTREMITIES:  No edema; No deformity.   ASSESSMENT:    1. S/P TAVR (transcatheter aortic valve replacement)   2. Permanent atrial fibrillation (HCC)   3. Mitral valve insufficiency, unspecified etiology   4. Essential hypertension   5. Thickening of wall of gallbladder   6. Pulmonary nodules     PLAN:    In order of problems listed above:  Severe AS s/p TAVR: echo today shows EF 60%, normally functioning TAVR with a mean gradient of 4 mm hg and no PVL. She has NYHA class I symptoms with a big improvement since TAVR, especially in her dizziness. SBE prophylaxis discussed; she has amoxicillin. Continue Eliquis 5mg  BID. I will see her back for 1 year visit and echo.    Permanent atrial fibrillation: rate well controlled on Cardizem CD 240 mg daily. Continue Eliquis 5mg  BID.    Mitral/Tricupid regurgitation: mild to moderate MR/TR on echo today. No murmur on exam   HTN: BP well controlled today. Continue Cardizem CD 240mg  daily, valsartan 320mg  daily and hydrochlorothiazide 25mg  daily.   Thickening of gallbladder: pre TAVR CTs showed "focal wall thickening of  the neck of the gallbladder, could be due to adenomyomatosis although neoplasm can not be excluded, however recommend further evaluation with right upper quadrant ultrasound." Follow up ultrasound was completed on 05/09/23 and unremarkable.    Pulmonary nodules: pre TAVR CTs showed "small bilateral solid pulmonary nodules measuring up to 3 mm. No follow-up needed if patient is low-risk." She is low risk and no further work up is recommended.  Medication Adjustments/Labs and Tests Ordered: Current medicines are reviewed at length with the patient today.  Concerns regarding medicines are outlined above.  No orders of the defined types were placed in this encounter.  No orders of the defined types were placed in this encounter.   Patient Instructions  Medication Instructions:  Your physician recommends that you continue on your current medications as directed. Please refer to the Current Medication list given to you today.  *If you need a refill on your cardiac medications before your next appointment, please call your pharmacy*   Lab Work: None ordered   If you have labs (blood work) drawn today and your tests are completely normal, you will receive your results only by: MyChart Message (if you have MyChart) OR A paper copy in the mail If you have any lab test that is abnormal or we need to change your treatment, we will call you to review the results.   Testing/Procedures: None ordered    Follow-Up: Follow up as scheduled    Other Instructions None    Signed, Cline Crock, PA-C  05/19/2023 9:35 AM    [facility]

## 2023-05-19 ENCOUNTER — Ambulatory Visit: Payer: Medicare Other | Admitting: Physician Assistant

## 2023-05-19 ENCOUNTER — Other Ambulatory Visit: Payer: Self-pay | Admitting: Physician Assistant

## 2023-05-19 ENCOUNTER — Ambulatory Visit: Payer: Medicare Other | Attending: Cardiology

## 2023-05-19 VITALS — BP 128/70 | HR 73 | Ht 66.0 in | Wt 160.0 lb

## 2023-05-19 DIAGNOSIS — I10 Essential (primary) hypertension: Secondary | ICD-10-CM | POA: Insufficient documentation

## 2023-05-19 DIAGNOSIS — I4821 Permanent atrial fibrillation: Secondary | ICD-10-CM | POA: Insufficient documentation

## 2023-05-19 DIAGNOSIS — Z952 Presence of prosthetic heart valve: Secondary | ICD-10-CM | POA: Insufficient documentation

## 2023-05-19 DIAGNOSIS — K828 Other specified diseases of gallbladder: Secondary | ICD-10-CM

## 2023-05-19 DIAGNOSIS — I340 Nonrheumatic mitral (valve) insufficiency: Secondary | ICD-10-CM | POA: Insufficient documentation

## 2023-05-19 DIAGNOSIS — R918 Other nonspecific abnormal finding of lung field: Secondary | ICD-10-CM | POA: Insufficient documentation

## 2023-05-19 LAB — ECHOCARDIOGRAM COMPLETE
AR max vel: 3.7 cm2
AV Area VTI: 3.07 cm2
AV Area mean vel: 3.31 cm2
AV Mean grad: 4 mm[Hg]
AV Peak grad: 6 mm[Hg]
Ao pk vel: 1.23 m/s
Calc EF: 54 %
MV M vel: 5.3 m/s
MV Peak grad: 112.4 mm[Hg]
Radius: 0.2 cm
S' Lateral: 2.05 cm
Single Plane A2C EF: 56.4 %
Single Plane A4C EF: 52.6 %

## 2023-05-19 NOTE — Patient Instructions (Signed)
Medication Instructions:  Your physician recommends that you continue on your current medications as directed. Please refer to the Current Medication list given to you today.  *If you need a refill on your cardiac medications before your next appointment, please call your pharmacy*   Lab Work: None ordered   If you have labs (blood work) drawn today and your tests are completely normal, you will receive your results only by: MyChart Message (if you have MyChart) OR A paper copy in the mail If you have any lab test that is abnormal or we need to change your treatment, we will call you to review the results.   Testing/Procedures: None ordered    Follow-Up: Follow up as scheduled    Other Instructions None   

## 2023-05-23 ENCOUNTER — Ambulatory Visit (INDEPENDENT_AMBULATORY_CARE_PROVIDER_SITE_OTHER): Payer: Medicare Other | Admitting: Family Medicine

## 2023-05-23 ENCOUNTER — Encounter: Payer: Self-pay | Admitting: Family Medicine

## 2023-05-23 VITALS — BP 136/92 | HR 104 | Temp 97.9°F | Ht 65.75 in | Wt 158.2 lb

## 2023-05-23 DIAGNOSIS — Z7189 Other specified counseling: Secondary | ICD-10-CM | POA: Diagnosis not present

## 2023-05-23 DIAGNOSIS — E559 Vitamin D deficiency, unspecified: Secondary | ICD-10-CM | POA: Diagnosis not present

## 2023-05-23 DIAGNOSIS — Z0000 Encounter for general adult medical examination without abnormal findings: Secondary | ICD-10-CM | POA: Diagnosis not present

## 2023-05-23 DIAGNOSIS — I10 Essential (primary) hypertension: Secondary | ICD-10-CM

## 2023-05-23 DIAGNOSIS — E039 Hypothyroidism, unspecified: Secondary | ICD-10-CM | POA: Diagnosis not present

## 2023-05-23 DIAGNOSIS — H9193 Unspecified hearing loss, bilateral: Secondary | ICD-10-CM | POA: Insufficient documentation

## 2023-05-23 DIAGNOSIS — Z8711 Personal history of peptic ulcer disease: Secondary | ICD-10-CM | POA: Diagnosis not present

## 2023-05-23 DIAGNOSIS — M85859 Other specified disorders of bone density and structure, unspecified thigh: Secondary | ICD-10-CM | POA: Diagnosis not present

## 2023-05-23 DIAGNOSIS — Z7901 Long term (current) use of anticoagulants: Secondary | ICD-10-CM | POA: Diagnosis not present

## 2023-05-23 DIAGNOSIS — E7800 Pure hypercholesterolemia, unspecified: Secondary | ICD-10-CM | POA: Diagnosis not present

## 2023-05-23 DIAGNOSIS — I4821 Permanent atrial fibrillation: Secondary | ICD-10-CM

## 2023-05-23 DIAGNOSIS — Z8673 Personal history of transient ischemic attack (TIA), and cerebral infarction without residual deficits: Secondary | ICD-10-CM

## 2023-05-23 DIAGNOSIS — N183 Chronic kidney disease, stage 3 unspecified: Secondary | ICD-10-CM

## 2023-05-23 DIAGNOSIS — Z952 Presence of prosthetic heart valve: Secondary | ICD-10-CM

## 2023-05-23 LAB — COMPREHENSIVE METABOLIC PANEL
ALT: 13 U/L (ref 0–35)
AST: 15 U/L (ref 0–37)
Albumin: 4.5 g/dL (ref 3.5–5.2)
Alkaline Phosphatase: 65 U/L (ref 39–117)
BUN: 29 mg/dL — ABNORMAL HIGH (ref 6–23)
CO2: 31 meq/L (ref 19–32)
Calcium: 9.4 mg/dL (ref 8.4–10.5)
Chloride: 102 meq/L (ref 96–112)
Creatinine, Ser: 1.1 mg/dL (ref 0.40–1.20)
GFR: 44.88 mL/min — ABNORMAL LOW (ref >60.00)
Glucose, Bld: 100 mg/dL — ABNORMAL HIGH (ref 70–99)
Potassium: 4.1 meq/L (ref 3.5–5.1)
Sodium: 141 meq/L (ref 135–145)
Total Bilirubin: 0.9 mg/dL (ref 0.2–1.2)
Total Protein: 7.2 g/dL (ref 6.0–8.3)

## 2023-05-23 LAB — CBC WITH DIFFERENTIAL/PLATELET
Basophils Absolute: 0.1 10*3/uL (ref 0.0–0.1)
Basophils Relative: 1 % (ref 0.0–3.0)
Eosinophils Absolute: 0.2 10*3/uL (ref 0.0–0.7)
Eosinophils Relative: 3.4 % (ref 0.0–5.0)
HCT: 39.7 % (ref 36.0–46.0)
Hemoglobin: 13.6 g/dL (ref 12.0–15.0)
Lymphocytes Relative: 21.7 % (ref 12.0–46.0)
Lymphs Abs: 1.1 10*3/uL (ref 0.7–4.0)
MCHC: 34.4 g/dL (ref 30.0–36.0)
MCV: 102.4 fL — ABNORMAL HIGH (ref 78.0–100.0)
Monocytes Absolute: 0.5 10*3/uL (ref 0.1–1.0)
Monocytes Relative: 10.2 % (ref 3.0–12.0)
Neutro Abs: 3.1 10*3/uL (ref 1.4–7.7)
Neutrophils Relative %: 63.7 % (ref 43.0–77.0)
Platelets: 192 10*3/uL (ref 150.0–400.0)
RBC: 3.88 Mil/uL (ref 3.87–5.11)
RDW: 12.7 % (ref 11.5–15.5)
WBC: 4.9 10*3/uL (ref 4.0–10.5)

## 2023-05-23 LAB — LIPID PANEL
Cholesterol: 201 mg/dL — ABNORMAL HIGH (ref 0–200)
HDL: 56.2 mg/dL (ref >39.00)
LDL Cholesterol: 110 mg/dL — ABNORMAL HIGH (ref 0–99)
NonHDL: 144.78
Total CHOL/HDL Ratio: 4
Triglycerides: 175 mg/dL — ABNORMAL HIGH (ref 0.0–149.0)
VLDL: 35 mg/dL (ref 0.0–40.0)

## 2023-05-23 LAB — MICROALBUMIN / CREATININE URINE RATIO
Creatinine,U: 136.1 mg/dL
Microalb Creat Ratio: 3 mg/g (ref 0.0–30.0)
Microalb, Ur: 4.1 mg/dL — ABNORMAL HIGH (ref 0.0–1.9)

## 2023-05-23 LAB — VITAMIN D 25 HYDROXY (VIT D DEFICIENCY, FRACTURES): VITD: 47.96 ng/mL (ref 30.00–100.00)

## 2023-05-23 LAB — TSH: TSH: 3.02 u[IU]/mL (ref 0.35–5.50)

## 2023-05-23 LAB — PHOSPHORUS: Phosphorus: 3.5 mg/dL (ref 2.3–4.6)

## 2023-05-23 MED ORDER — HYDROCHLOROTHIAZIDE 25 MG PO TABS: 25.0000 mg | ORAL_TABLET | Freq: Every day | ORAL | 4 refills | Status: DC

## 2023-05-23 MED ORDER — VALSARTAN 320 MG PO TABS: 320.0000 mg | ORAL_TABLET | Freq: Every day | ORAL | 4 refills | Status: DC

## 2023-05-23 MED ORDER — LEVOTHYROXINE SODIUM 75 MCG PO TABS: 75.0000 ug | ORAL_TABLET | Freq: Every day | ORAL | 4 refills | Status: DC

## 2023-05-23 MED ORDER — ATORVASTATIN CALCIUM 20 MG PO TABS: 20.0000 mg | ORAL_TABLET | Freq: Every day | ORAL | 4 refills | Status: DC

## 2023-05-23 MED ORDER — PANTOPRAZOLE SODIUM 40 MG PO TBEC: 40.0000 mg | DELAYED_RELEASE_TABLET | Freq: Every day | ORAL | 4 refills | Status: DC

## 2023-05-23 NOTE — Assessment & Plan Note (Signed)
Not on replacement. Update levels.

## 2023-05-23 NOTE — Assessment & Plan Note (Signed)
H/o this - continues PPI daily in setting of eliquis use. Consider dropping dose if tolerated, adding pepcid every other day. Update kidney function then decide.

## 2023-05-23 NOTE — Patient Instructions (Addendum)
Call to schedule mammogram and bone density scan at your convenience: [facility] [phone].  Labs today  Good to see you today  Return as needed or in 1 year for next physical/wellness visit

## 2023-05-23 NOTE — Progress Notes (Signed)
Ph: [phone] Fax: [phone]   Patient ID: Judy Walker, female    DOB: 1933-02-03, 89 y.o.   MRN: [id number]  This visit was conducted in person.  BP (!) 136/92   Pulse (!) 104   Temp 97.9 F (36.6 C) (Oral)   Ht 5' 5.75" (1.67 m)   Wt 158 lb 4 oz (71.8 kg)   SpO2 99%   BMI 25.74 kg/m   BP Readings from Last 3 Encounters:  05/23/23 (!) 136/92  05/19/23 128/70  04/28/23 (!) 142/74   CC: CP/AMW Subjective:   HPI: BERMA WOJTON is a 89 y.o. female presenting on 05/23/2023 for Medicare Wellness   Did not see health advisor this year.  Hearing Screening   500Hz  1000Hz  2000Hz  4000Hz   Right ear 0 0 0 0  Left ear 0 0 0 40  Comments: Pt states she's aware of decreased hearing.   Heard PT in R ear at 40 dBHL.   Vision Screening   Right eye Left eye Both eyes  Without correction 20/30 20/70 20/40   With correction     Comments: Pt states she has implants.   Flowsheet Row Office Visit from 05/20/2022 in [facility] [place]  PHQ-2 Total Score 0     Declines audiology referral.     05/20/2022   10:08 AM 05/14/2021   10:33 AM 05/12/2020   10:43 AM 11/18/2019   10:32 AM 03/22/2019   10:54 AM  Fall Risk   Falls in the past year? 0 0 0 1 0  Number falls in past yr:  0 0 0   Injury with Fall?  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 Eliquis was stopped (afib s/p cardioversion 06/2019 with recurrence), PPI started. Subsequent EGD showing 4cm HH, healed ulcers, likely scarring as mucosal change. Eliquis subsequently restarted. She last saw GI 08/2021, planned Q19yr check,  continue pantoprazole 40mg  daily.    H/o moderately severe myxomatous MR and moderate TR along with aortic stenosis s/p TAVR 04/2023 due to progressive exertional dyspnea dizziness and fatigue. Needs amoxicillin for SBE ppx.   Preventative: Colon cancer screening - iFOB positive 2015. We referred to Dr [name] per pt preference 2015, pt did not go. No fmhx colon cancer. Rpt iFOB returned negative (2016). Pt requested screening stopped - no new blood in stool or bowel changes since.  Well woman - benign hysterectomy 2003, ovarian cysts. Aged out.  [facility] Plans Q1-34yrs. Does breast exams at home. Sisters x2 with breast cancer.  DEXA - T score -1.9 (07/2012), T -2.3 femur (09/2015). Has been on fosamax since ~2014, stopped 2019.  DEXA 03/2019 - T -2.0 hip.  Doesn't like dairy products. Takes calcium supplement daily. Recommend regular weight bearing exercise.  Lung cancer screening - not eligible  Flu shot yearly COVID vaccine Pfizer 07/2019, 08/2019, booster 03/2020, bivalent booster 02/2021, booster 04/2022 Pneumovax 2006, prevnar-13 2015.  Td 2012.  Zostavax - 09/2013.  Shingrix - discussed, declines.  Advanced directives - scanned and in chart 09/2017. HCPOA are son Jonny Ruiz and daughter Meriam Sprague. Does not desire life prolonging measures  if terminal condition Seatbelt use discussed. Sunscreen use discussed.  No suspicious moles.  Sleep - averaging 7 hours/night  Non smoker  Alcohol - rarely  Eye doctor - last seen 2022, s/p cataract surgery 2010s. No fmhx glaucoma  Dentist - yearly  Bowel - no constipation  Bladder - no incontinence    Doesn't drive - never has. Lives alone. Widow 2012. Spends 1/2 year at [employer] Daughter lives nearby. Son also helps.  Occupation: retired Catering manager  Activity: Regular exercise - walking 1-2 miles daily, stays active in yard.  Diet: daily fruits/vegetables, some water.       Relevant past medical, surgical, family and  social history reviewed and updated as indicated. Interim medical history since our last visit reviewed. Allergies and medications reviewed and updated. Outpatient Medications Prior to Visit  Medication Sig Dispense Refill   amoxicillin (AMOXIL) 500 MG capsule Take 4 capsules (2,000 mg total) by mouth as needed. Take 4 capsules (2,000 mg total) prior to dental appointments 16 capsule 3   Ascorbic Acid (VITAMIN C GUMMIE) 120 MG CHEW Chew 250 mg by mouth daily.     Calcium Carbonate (CALCIUM 500 PO) Take 500 mg by mouth daily.     diltiazem (CARDIZEM CD) 240 MG 24 hr capsule TAKE 1 CAPSULE BY MOUTH EVERY DAY 90 capsule 1   diphenhydrAMINE (BENADRYL) 25 mg capsule Take 25 mg by mouth at bedtime. Sleep     ELIQUIS 5 MG TABS tablet TAKE 1 TABLET BY MOUTH TWICE A DAY 60 tablet 5   Multiple Vitamins-Minerals (MULTIVITAMIN ADULT) TABS Take 1 tablet by mouth daily.     potassium gluconate 595 (99 K) MG TABS tablet Take 595 mg by mouth in the morning.     atorvastatin (LIPITOR) 20 MG tablet Take 1 tablet (20 mg total) by mouth daily. (Patient taking differently: Take 20 mg by mouth at bedtime.) 90 tablet 4   hydrochlorothiazide (HYDRODIURIL) 25 MG tablet Take 1 tablet (25 mg total) by mouth daily. 90 tablet 4   levothyroxine (SYNTHROID) 75 MCG tablet Take 1 tablet (75 mcg total) by mouth daily. 90 tablet 4   pantoprazole (PROTONIX) 40 MG tablet Take 1 tablet (40 mg total) by mouth daily. (Patient taking differently: Take 40 mg by mouth at bedtime.) 90 tablet 4   valsartan (DIOVAN) 320 MG tablet TAKE 1 TABLET BY MOUTH EVERY DAY 90 tablet 0   No facility-administered medications prior to visit.     Per HPI unless specifically indicated in ROS section below Review of Systems  Constitutional:  Negative for activity change, appetite change, chills, fatigue, fever and unexpected weight change.  HENT:  Negative for hearing loss.   Eyes:  Negative for visual disturbance.  Respiratory:  Positive for shortness  of breath (with exertion - overal improved). Negative for cough, chest tightness and wheezing.   Cardiovascular:  Positive for palpitations (occ with exertion). Negative for chest pain and leg swelling.  Gastrointestinal:  Negative for abdominal distention, abdominal pain, blood in stool, constipation, diarrhea, nausea and vomiting.  Genitourinary:  Negative for difficulty urinating and hematuria.  Musculoskeletal:  Negative for arthralgias, myalgias and neck pain.  Skin:  Negative for rash.  Neurological:  Negative for dizziness, seizures, syncope and headaches.  Hematological:  Negative for adenopathy. Does not bruise/bleed easily.  Psychiatric/Behavioral:  Negative for dysphoric mood. The patient is not nervous/anxious.     Objective:  BP (!) 136/92   Pulse (!) 104   Temp 97.9 F (36.6 C) (Oral)  Ht 5' 5.75" (1.67 m)   Wt 158 lb 4 oz (71.8 kg)   SpO2 99%   BMI 25.74 kg/m   Wt Readings from Last 3 Encounters:  05/23/23 158 lb 4 oz (71.8 kg)  05/19/23 160 lb (72.6 kg)  04/28/23 160 lb (72.6 kg)      Physical Exam Vitals and nursing note reviewed.  Constitutional:      Appearance: Normal appearance. She is not ill-appearing.  HENT:     Head: Normocephalic and atraumatic.     Right Ear: Tympanic membrane, ear canal and external ear normal. There is no impacted cerumen.     Left Ear: Tympanic membrane, ear canal and external ear normal. There is no impacted cerumen.     Mouth/Throat:     Mouth: Mucous membranes are moist.     Pharynx: Oropharynx is clear. No oropharyngeal exudate or posterior oropharyngeal erythema.  Eyes:     General:        Right eye: No discharge.        Left eye: No discharge.     Extraocular Movements: Extraocular movements intact.     Conjunctiva/sclera: Conjunctivae normal.     Pupils: Pupils are equal, round, and reactive to light.  Neck:     Thyroid: No thyroid mass or thyromegaly.     Vascular: No carotid bruit.  Cardiovascular:     Rate and  Rhythm: Normal rate and regular rhythm.     Pulses: Normal pulses.     Heart sounds: Normal heart sounds. No murmur heard. Pulmonary:     Effort: Pulmonary effort is normal. No respiratory distress.     Breath sounds: Normal breath sounds. No wheezing, rhonchi or rales.  Abdominal:     General: Bowel sounds are normal. There is no distension.     Palpations: Abdomen is soft. There is no mass.     Tenderness: There is no abdominal tenderness. There is no guarding or rebound.     Hernia: No hernia is present.  Musculoskeletal:     Cervical back: Normal range of motion and neck supple. No rigidity.     Right lower leg: No edema.     Left lower leg: No edema.  Lymphadenopathy:     Cervical: No cervical adenopathy.  Skin:    General: Skin is warm and dry.     Findings: No rash.  Neurological:     General: No focal deficit present.     Mental Status: She is alert. Mental status is at baseline.     Comments:  Recall 3/3 Calculation 5/5 DLROW  Psychiatric:        Mood and Affect: Mood normal.        Behavior: Behavior normal.        Assessment & Plan:   Problem List Items Addressed This Visit     Essential hypertension (Chronic)   Chronic, overall stable on current regimen. BP elevated on recheck - I did ask her to continue monitoring BP at home, let me know if consistently elevated.       Relevant Medications   atorvastatin (LIPITOR) 20 MG tablet   hydrochlorothiazide (HYDRODIURIL) 25 MG tablet   valsartan (DIOVAN) 320 MG tablet   Medicare annual wellness visit, subsequent - Primary (Chronic)   I have personally reviewed the Medicare Annual Wellness questionnaire and have noted 1. The patient's medical and social history 2. Their use of alcohol, tobacco or illicit drugs 3. Their current medications and supplements 4. The patient's  functional ability including ADL's, fall risks, home safety risks and hearing or visual impairment. Cognitive function has been assessed and  addressed as indicated.  5. Diet and physical activity 6. Evidence for depression or mood disorders The patients weight, height, BMI have been recorded in the chart. I have made referrals, counseling and provided education to the patient based on review of the above and I have provided the pt with a written personalized care plan for preventive services. Provider list updated.. See scanned questionairre as needed for further documentation. Reviewed preventative protocols and updated unless pt declined.       Health maintenance examination (Chronic)   Preventative protocols reviewed and updated unless pt declined. Discussed healthy diet and lifestyle.       History of ischemic stroke (Chronic)   Continue eliquis, statin.       Permanent atrial fibrillation (HCC) (Chronic)   Continues eliquis and diltiazem      Relevant Medications   atorvastatin (LIPITOR) 20 MG tablet   hydrochlorothiazide (HYDRODIURIL) 25 MG tablet   valsartan (DIOVAN) 320 MG tablet   Chronic anticoagulation (Chronic)   Pure hypercholesterolemia (Chronic)   Chronic, stable on atorvastatin. Update FLP.  The ASCVD Risk score (Arnett DK, et al., 2019) failed to calculate for the following reasons:   The 2019 ASCVD risk score is only valid for ages 46 to 8       Relevant Medications   atorvastatin (LIPITOR) 20 MG tablet   hydrochlorothiazide (HYDRODIURIL) 25 MG tablet   valsartan (DIOVAN) 320 MG tablet   Other Relevant Orders   Lipid panel   Comprehensive metabolic panel   Advanced directives, counseling/discussion (Chronic)   Advanced directives - scanned and in chart 09/2017. HCPOA are son Jonny Ruiz and daughter Meriam Sprague. Does not desire life prolonging measures if terminal condition      Hypothyroidism   Update TSH on levothyroxine daily.       Relevant Medications   levothyroxine (SYNTHROID) 75 MCG tablet   Other Relevant Orders   TSH   Osteopenia of hip   On fosamax x 5 years.  Update DEXA at  next mammogram appointment.       Relevant Orders   DG Bone Density   VITAMIN D 25 Hydroxy (Vit-D Deficiency, Fractures)   Vitamin D deficiency   Not on replacement. Update levels.       History of gastric ulcer   H/o this - continues PPI daily in setting of eliquis use. Consider dropping dose if tolerated, adding pepcid every other day. Update kidney function then decide.       Relevant Medications   pantoprazole (PROTONIX) 40 MG tablet   CKD (chronic kidney disease) stage 3, GFR 30-59 ml/min (HCC)   Update renal function.       Relevant Orders   Comprehensive metabolic panel   Phosphorus   VITAMIN D 25 Hydroxy (Vit-D Deficiency, Fractures)   Microalbumin / creatinine urine ratio   Parathyroid hormone, intact (no Ca)   CBC with Differential/Platelet   S/P TAVR (transcatheter aortic valve replacement)   Appreciate cardiology care.  Needs SBE PPX      Decreased hearing of both ears   Denies significant trouble, declines audiology eval.         Meds ordered this encounter  Medications   atorvastatin (LIPITOR) 20 MG tablet    Sig: Take 1 tablet (20 mg total) by mouth daily.    Dispense:  90 tablet    Refill:  4   hydrochlorothiazide (  HYDRODIURIL) 25 MG tablet    Sig: Take 1 tablet (25 mg total) by mouth daily.    Dispense:  90 tablet    Refill:  4   levothyroxine (SYNTHROID) 75 MCG tablet    Sig: Take 1 tablet (75 mcg total) by mouth daily.    Dispense:  90 tablet    Refill:  4   pantoprazole (PROTONIX) 40 MG tablet    Sig: Take 1 tablet (40 mg total) by mouth daily.    Dispense:  90 tablet    Refill:  4   valsartan (DIOVAN) 320 MG tablet    Sig: Take 1 tablet (320 mg total) by mouth daily.    Dispense:  90 tablet    Refill:  4    Orders Placed This Encounter  Procedures   DG Bone Density    Standing Status:   Future    Expiration Date:   05/22/2024    Reason for Exam (SYMPTOM  OR DIAGNOSIS REQUIRED):   ostoepenia f/u    [facility]   Lipid panel   Comprehensive metabolic panel   Phosphorus   VITAMIN D 25 Hydroxy (Vit-D Deficiency, Fractures)   Microalbumin / creatinine urine ratio   Parathyroid hormone, intact (no Ca)   CBC with Differential/Platelet   TSH    Patient Instructions  Call to schedule mammogram and bone density scan at your convenience: [facility] [phone].  Labs today  Good to see you today  Return as needed or in 1 year for next physical/wellness visit   Follow up plan: Return in about 1 year (around 05/22/2024), or if symptoms worsen or fail to improve, for annual exam, prior fasting for blood work, medicare wellness visit.  [name], MD

## 2023-05-23 NOTE — Assessment & Plan Note (Signed)
Update renal function.  

## 2023-05-23 NOTE — Assessment & Plan Note (Signed)
Preventative protocols reviewed and updated unless pt declined. Discussed healthy diet and lifestyle.  

## 2023-05-23 NOTE — Assessment & Plan Note (Signed)
Continue eliquis, statin. 

## 2023-05-23 NOTE — Assessment & Plan Note (Signed)
Continues eliquis and diltiazem.

## 2023-05-23 NOTE — Assessment & Plan Note (Signed)
On fosamax x 5 years.  Update DEXA at next mammogram appointment.

## 2023-05-23 NOTE — Assessment & Plan Note (Addendum)
Chronic, overall stable on current regimen. BP elevated on recheck - I did ask her to continue monitoring BP at home, let me know if consistently elevated.

## 2023-05-23 NOTE — Assessment & Plan Note (Addendum)
Chronic, stable on atorvastatin. Update FLP.  The ASCVD Risk score (Arnett DK, et al., 2019) failed to calculate for the following reasons:   The 2019 ASCVD risk score is only valid for ages 80 to 76

## 2023-05-23 NOTE — Assessment & Plan Note (Signed)

## 2023-05-23 NOTE — Assessment & Plan Note (Signed)
Denies significant trouble, declines audiology eval.

## 2023-05-23 NOTE — Assessment & Plan Note (Signed)
Advanced directives - scanned and in chart 09/2017. HCPOA are son Jenny Reichmann and daughter Rise Paganini. Does not desire life prolonging measures if terminal condition

## 2023-05-23 NOTE — Assessment & Plan Note (Signed)
Update TSH on levothyroxine 75mcg daily 

## 2023-05-23 NOTE — Assessment & Plan Note (Signed)
Appreciate cardiology care.  Needs SBE PPX

## 2023-05-24 ENCOUNTER — Other Ambulatory Visit: Payer: Self-pay | Admitting: Family Medicine

## 2023-05-24 DIAGNOSIS — Z0000 Encounter for general adult medical examination without abnormal findings: Secondary | ICD-10-CM

## 2023-05-24 LAB — PARATHYROID HORMONE, INTACT (NO CA): PTH: 47 pg/mL (ref 16–77)

## 2023-05-26 ENCOUNTER — Other Ambulatory Visit: Payer: Self-pay | Admitting: Cardiology

## 2023-06-08 ENCOUNTER — Other Ambulatory Visit: Payer: Self-pay | Admitting: Cardiovascular Disease

## 2023-06-08 DIAGNOSIS — I4821 Permanent atrial fibrillation: Secondary | ICD-10-CM

## 2023-06-09 NOTE — Telephone Encounter (Signed)
 Prescription refill request for Eliquis received. Indication:afib Last office visit:12/24 Scr:1.10  12/24 Age: 89 Weight:71.8  kg  Prescription refilled

## 2023-06-23 ENCOUNTER — Other Ambulatory Visit: Payer: Self-pay

## 2023-06-23 MED ORDER — DILTIAZEM HCL ER COATED BEADS 240 MG PO CP24: 240.0000 mg | ORAL_CAPSULE | Freq: Every day | ORAL | 3 refills | Status: DC

## 2023-07-25 ENCOUNTER — Telehealth: Payer: Self-pay | Admitting: Family Medicine

## 2023-07-25 DIAGNOSIS — E039 Hypothyroidism, unspecified: Secondary | ICD-10-CM

## 2023-07-25 NOTE — Telephone Encounter (Signed)
 iane from the pharmacy is calling in to get a verbal order for patient manufactory to be changed from [employer]land to  aflvogen

## 2023-07-25 NOTE — Telephone Encounter (Signed)
 Spoke with [facility] to get clarification of message and what med. States they did not call and their records show rx was transferred out to [facility].   Spoke with Arlys John of [facility] asking for clarification of manufacturer change. Per Arlys John, pt transferred levothyroxine 75 mcg rx to them. However, their supplying manufacture is changing from [employer] to [employer]. They are asking if ok to change to new manufacturer. Call back with ref #:  [id number].  Spoke with pt notifying her of manufacture. States she got a call from [facility] this AM about the change. States she is ok with the change if Dr Reece Agar is.

## 2023-07-25 NOTE — Addendum Note (Signed)
 Addended by: [name] on: 07/25/2023 05:42 PM   Modules accepted: Orders

## 2023-07-25 NOTE — Telephone Encounter (Signed)
 Ok to make this switch. Schedule lab visit 4-6 wks after making switch for TSH check. Ordered.

## 2023-07-26 ENCOUNTER — Telehealth: Payer: Self-pay

## 2023-07-26 NOTE — Telephone Encounter (Signed)
 Contacted [facility] to make the switch. Confirmed that they will start process and send off to pt.   Contacted pt and scheduled for 4-6 week lab visit to check TSH levels. 08/25/23.  No further questions or concerns.

## 2023-07-26 NOTE — Telephone Encounter (Signed)
 Copied from CRM [id number]. Topic: Clinical - Medication Question >> Jul 26, 2023  9:11 AM Kathryne Eriksson wrote: Reason for CRM: levothyroxine (SYNTHROID) 75 MCG tablet >> Jul 26, 2023  9:16 AM Kathryne Eriksson wrote: [facility]" [phone] Called on behalf of patient's medication levothyroxine (SYNTHROID) 75 MCG tablet , wanting permission to change from [employer] to [facility]. States patient already gave permission for the change.

## 2023-07-26 NOTE — Telephone Encounter (Signed)
 I already responded to this request - please see yesterday's phone note.

## 2023-07-26 NOTE — Telephone Encounter (Signed)
 Lab appt scheduled for 08/25/23

## 2023-07-27 ENCOUNTER — Other Ambulatory Visit: Payer: Self-pay | Admitting: Family Medicine

## 2023-07-27 DIAGNOSIS — I10 Essential (primary) hypertension: Secondary | ICD-10-CM

## 2023-07-27 NOTE — Telephone Encounter (Signed)
 Spoke with [facility] providing ref #: [id number] to inform them Dr Reece Agar is giving ok to change manufacturer. States Judy Walker T had already called and made them aware.

## 2023-08-25 ENCOUNTER — Other Ambulatory Visit (INDEPENDENT_AMBULATORY_CARE_PROVIDER_SITE_OTHER): Payer: Medicare Other

## 2023-08-25 DIAGNOSIS — E039 Hypothyroidism, unspecified: Secondary | ICD-10-CM | POA: Diagnosis not present

## 2023-08-25 NOTE — Addendum Note (Signed)
 Addended by: Alvina Chou on: 08/25/2023 02:43 PM   Modules accepted: Orders

## 2023-08-26 LAB — TSH: TSH: 2.92 m[IU]/L (ref 0.40–4.50)

## 2023-08-29 ENCOUNTER — Encounter: Payer: Self-pay | Admitting: Family Medicine

## 2023-09-01 ENCOUNTER — Other Ambulatory Visit: Payer: Medicare Other

## 2023-09-26 ENCOUNTER — Ambulatory Visit (INDEPENDENT_AMBULATORY_CARE_PROVIDER_SITE_OTHER)

## 2023-09-26 VITALS — BP 136/92 | Ht 65.75 in | Wt 150.0 lb

## 2023-09-26 DIAGNOSIS — Z0000 Encounter for general adult medical examination without abnormal findings: Secondary | ICD-10-CM | POA: Diagnosis not present

## 2023-09-26 DIAGNOSIS — Z2821 Immunization not carried out because of patient refusal: Secondary | ICD-10-CM | POA: Diagnosis not present

## 2023-09-26 NOTE — Progress Notes (Signed)
 Because this visit was a virtual/telehealth visit,  certain criteria was not obtained, such a blood pressure, CBG if applicable, and timed get up and go. Any medications not marked as "taking" were not mentioned during the medication reconciliation part of the visit. Any vitals not documented were not able to be obtained due to this being a telehealth visit or patient was unable to self-report a recent blood pressure reading due to a lack of equipment at home via telehealth. Vitals that have been documented are verbally provided by the patient.  Subjective:   Judy Walker is a 89 y.o. who presents for a Medicare Wellness preventive visit.  Visit Complete: Virtual I connected with  Judy Walker on 09/26/23 by a audio enabled telemedicine application and verified that I am speaking with the correct person using two identifiers.  Patient Location: Home  Provider Location: Home Office  I discussed the limitations of evaluation and management by telemedicine. The patient expressed understanding and agreed to proceed.  Vital Signs: Because this visit was a virtual/telehealth visit, some criteria may be missing or patient reported. Any vitals not documented were not able to be obtained and vitals that have been documented are patient reported.  VideoDeclined- This patient declined Librarian, academic. Therefore the visit was completed with audio only.  Persons Participating in Visit: Patient.  AWV Questionnaire: No: Patient Medicare AWV questionnaire was not completed prior to this visit.  Cardiac Risk Factors include: advanced age (>15men, >45 women);hypertension     Objective:    Today's Vitals   09/26/23 1117  BP: (!) 136/92  Weight: 150 lb (68 kg)  Height: 5' 5.75" (1.67 m)   Body mass index is 24.4 kg/m.     09/26/2023   11:17 AM 04/18/2023    5:47 AM 03/30/2023    8:00 AM 05/14/2021   10:31 AM 05/12/2020   10:39 AM 06/27/2019    8:29 AM  03/22/2019   10:53 AM  Advanced Directives  Does Patient Have a Medical Advance Directive? Yes Yes Yes Yes Yes Yes Yes  Type of Estate agent of [place];Living will Healthcare Power of [place];Living will Healthcare Power of [place];Living will Healthcare Power of [place];Living will Healthcare Power of [place];Living will Living will;Healthcare Power of [employer] Power of [place];Living will  Does patient want to make changes to medical advance directive? No - Patient declined   Yes (MAU/Ambulatory/Procedural Areas - Information given)     Copy of Healthcare Power of Attorney in Chart? Yes - validated most recent copy scanned in chart (See row information) Yes - validated most recent copy scanned in chart (See row information)  Yes - validated most recent copy scanned in chart (See row information) Yes - validated most recent copy scanned in chart (See row information) No - copy requested Yes - validated most recent copy scanned in chart (See row information)    Current Medications (verified) Outpatient Encounter Medications as of 09/26/2023  Medication Sig   amoxicillin  (AMOXIL ) 500 MG capsule Take 4 capsules (2,000 mg total) by mouth as needed. Take 4 capsules (2,000 mg total) prior to dental appointments   Ascorbic Acid (VITAMIN C  GUMMIE) 120 MG CHEW Chew 250 mg by mouth daily.   atorvastatin  (LIPITOR) 20 MG tablet Take 1 tablet (20 mg total) by mouth daily.   Calcium  Carbonate (CALCIUM  500 PO) Take 500 mg by mouth daily.   diltiazem  (CARDIZEM  CD) 240 MG 24 hr capsule Take 1 capsule (240 mg total) by  mouth daily.   diphenhydrAMINE  (BENADRYL ) 25 mg capsule Take 25 mg by mouth at bedtime. Sleep   ELIQUIS  5 MG TABS tablet TAKE 1 TABLET BY MOUTH TWICE A DAY   hydrochlorothiazide  (HYDRODIURIL ) 25 MG tablet Take 1 tablet (25 mg total) by mouth daily.   levothyroxine  (SYNTHROID ) 75 MCG tablet Take 1 tablet (75 mcg total) by mouth daily.   Multiple  Vitamins-Minerals (MULTIVITAMIN ADULT) TABS Take 1 tablet by mouth daily.   pantoprazole  (PROTONIX ) 40 MG tablet Take 1 tablet (40 mg total) by mouth daily.   potassium gluconate 595 (99 K) MG TABS tablet Take 595 mg by mouth in the morning.   valsartan  (DIOVAN ) 320 MG tablet TAKE 1 TABLET BY MOUTH DAILY   No facility-administered encounter medications on file as of 09/26/2023.    Allergies (verified) Patient has no known allergies.   History: Past Medical History:  Diagnosis Date   Anemia    Atrial fibrillation (HCC)    Benign positional vertigo 2005   Colonoscopy refused    " I elected not to have one" SOC reviewed 07/06/10; she continues to decline colonscopy   Heart murmur    HLD (hyperlipidemia)    total cholesterol 340 in 2003, NHDL 61, TG 75. NMR Lipoprofile 2005: LDL 84 (933/285), LDL glad =<120 ideally <90. Framingham study LDL goal <130   HTN (hypertension)    Hypothyroidism    Osteopenia 08/2012, 09/2015   DEXA -1.9 (2014) -2.3 (2017)   S/P TAVR (transcatheter aortic valve replacement) 04/18/2023   s/p TAVR with a 26 mm Edwards S3UR via the TF approach by Dr. Arlester Ladd and Dr. Sherene Dilling   Severe aortic stenosis    Past Surgical History:  Procedure Laterality Date   ABDOMINAL HYSTERECTOMY  2003   & BSO for cystic ovaries . No PMH of abnormal PAP   CARDIAC CATHETERIZATION     normal   CARDIOVERSION N/A 06/27/2019   Procedure: CARDIOVERSION;  Surgeon: Hugh Madura, MD;  Location: [facility] ENDOSCOPY;  Service: Cardiovascular;  Laterality: N/A;   CATARACT EXTRACTION, BILATERAL  09/2011   Dr.[name]    CYSTOSCOPY     bladder @ age 67 for painful hematuria   ESOPHAGOGASTRODUODENOSCOPY  12/2019   prepyloric scarring, 4cm HH, biopsies with healing ulcer, neg H pylori Willy Harvest)   ESOPHAGOGASTRODUODENOSCOPY  11/2019   UGI bleed - mass present @ [facility]   INTRAOPERATIVE TRANSTHORACIC ECHOCARDIOGRAM N/A 04/18/2023   Procedure: INTRAOPERATIVE TRANSTHORACIC ECHOCARDIOGRAM;   Surgeon: Arnoldo Lapping, MD;  Location: [facility] INVASIVE CV LAB;  Service: Cardiovascular;  Laterality: N/A;   RIGHT/LEFT HEART CATH AND CORONARY ANGIOGRAPHY N/A 03/30/2023   Procedure: RIGHT/LEFT HEART CATH AND CORONARY ANGIOGRAPHY;  Surgeon: Arnoldo Lapping, MD;  Location: [facility] INVASIVE CV LAB;  Service: Cardiovascular;  Laterality: N/A;   Family History  Problem Relation Age of Onset   Kidney cancer Mother    Diabetes Father    Heart attack Father        in 71s   Breast cancer Sister    Stroke Sister    Cirrhosis Sister        veins came appart and had to be glued back together   Breast cancer Sister        mets   Breast cancer Sister        34s   Ovarian cancer Sister    Alzheimer's disease Sister    Cancer Maternal Grandmother        unknown type   Diabetes Paternal Grandfather  Heart attack Brother        1 pre 51   Brain cancer Brother    Heart attack Brother    Other Brother        died at birth   Other Brother        died at birth   Heart disease Brother    Ulcerative colitis Son    Diabetes Other         4 brothers and  3 sisters   Social History   Socioeconomic History   Marital status: Widowed    Spouse name: Not on file   Number of children: 2   Years of education: Not on file   Highest education level: 12th grade  Occupational History   Occupation: retired  Tobacco Use   Smoking status: Never   Smokeless tobacco: Never  Vaping Use   Vaping status: Never Used  Substance and Sexual Activity   Alcohol use: Yes    Comment: RARELY- glass of wine once a month   Drug use: No   Sexual activity: Not on file  Other Topics Concern   Not on file  Social History Narrative   Lives alone. Widow 2012   Daughter lives nearby.   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   Diet: daily fruits/vegetables, some water   Social Drivers of Health   Financial  Resource Strain: Low Risk  (09/24/2023)   Overall Financial Resource Strain (CARDIA)    Difficulty of Paying Living Expenses: Not hard at all  Food Insecurity: No Food Insecurity (09/24/2023)   Hunger Vital Sign    Worried About Running Out of Food in the Last Year: Never true    Ran Out of Food in the Last Year: Never true  Transportation Needs: No Transportation Needs (09/24/2023)   PRAPARE - Administrator, Civil Service (Medical): No    Lack of Transportation (Non-Medical): No  Physical Activity: Insufficiently Active (09/24/2023)   Exercise Vital Sign    Days of Exercise per Week: 2 days    Minutes of Exercise per Session: 20 min  Stress: No Stress Concern Present (09/24/2023)   [employer] of Occupational Health - Occupational Stress Questionnaire    Feeling of Stress : Not at all  Social Connections: Moderately Isolated (09/24/2023)   Social Connection and Isolation Panel [NHANES]    Frequency of Communication with Friends and Family: More than three times a week    Frequency of Social Gatherings with Friends and Family: Once a week    Attends Religious Services: More than 4 times per year    Active Member of [employer] or Organizations: No    Attends Banker Meetings: Not on file    Marital Status: Widowed    Tobacco Counseling Counseling given: Not Answered    Clinical Intake:  Pre-visit preparation completed: Yes  Pain : No/denies pain     BMI - recorded: 24.4 Nutritional Status: BMI of 19-24  Normal Nutritional Risks: None Diabetes: No  No results found for: "HGBA1C"   How often do you need to have someone help you when you read instructions, pamphlets, or other written materials from your doctor or pharmacy?: 1 - Never What is the last grade level you completed in school?: HS GRad  Interpreter Needed?: No  Information entered by :: Delpha Fickle   Activities of Daily Living  09/26/2023   11:23 AM 04/18/2023    6:02 AM   In your present state of health, do you have any difficulty performing the following activities:  Hearing? 0   Vision? 0   Difficulty concentrating or making decisions? 0   Walking or climbing stairs? 0   Dressing or bathing? 0   Doing errands, shopping? 0 1  Preparing Food and eating ? N   Using the Toilet? N   In the past six months, have you accidently leaked urine? N   Do you have problems with loss of bowel control? N   Managing your Medications? N   Managing your Finances? N   Housekeeping or managing your Housekeeping? N     Patient Care Team: Claire Crick, MD as PCP - General (Family Medicine) Hugh Madura, MD as PCP - Cardiology (Cardiology) Arnoldo Lapping, MD as PCP - Structural Heart (Cardiology)  Indicate any recent Medical Services you may have received from other than [facility] providers in the past year (date may be approximate).     Assessment:   This is a routine wellness examination for Judy Walker.  Hearing/Vision screen Hearing Screening - Comments:: No hearing difficulties Vision Screening - Comments:: Wears readers   Goals Addressed             This Visit's Progress    Increase physical activity   On track    Starting 09/26/2017, I will continue to walk for 30 minutes daily.        Depression Screen     09/26/2023   11:24 AM 05/23/2023   10:37 AM 05/20/2022   10:08 AM 05/14/2021   10:36 AM 05/12/2020   10:44 AM 03/22/2019   10:54 AM 09/26/2017   10:55 AM  PHQ 2/9 Scores  PHQ - 2 Score 0 0 0 0 0 0 0  PHQ- 9 Score 2 1   0 0 0    Fall Risk     09/26/2023   11:21 AM 05/23/2023   10:37 AM 05/20/2022   10:08 AM 05/14/2021   10:33 AM 05/12/2020   10:43 AM  Fall Risk   Falls in the past year? 0 0 0 0 0  Number falls in past yr: 0   0 0  Injury with Fall? 0   0 0  Risk for fall due to : No Fall Risks   No Fall Risks Medication side effect  Follow up Falls prevention discussed;Falls evaluation completed   Falls prevention discussed Falls  evaluation completed;Falls prevention discussed    MEDICARE RISK AT HOME:  Medicare Risk at Home Any stairs in or around the home?: Yes If so, are there any without handrails?: No Home free of loose throw rugs in walkways, pet beds, electrical cords, etc?: Yes Adequate lighting in your home to reduce risk of falls?: Yes Life alert?: No Use of a cane, walker or w/c?: No Grab bars in the bathroom?: No Shower chair or bench in shower?: No Elevated toilet seat or a handicapped toilet?: Yes  TIMED UP AND GO:  Was the test performed?  No  Cognitive Function: 6CIT completed    05/12/2020   10:46 AM 03/22/2019   10:55 AM 09/26/2017   10:55 AM 09/22/2016   10:34 AM  MMSE - Mini Mental State Exam  Orientation to time 5 5 5 5   Orientation to Place 5 5 5 5   Registration 3 3 3 3   Attention/ Calculation 5 5 0 0  Recall 3 3 2  3  Recall-comments   unable to recall 1 of 3 words   Language- name 2 objects   0 0  Language- repeat 1 1 1 1   Language- follow 3 step command   3 3  Language- read & follow direction   0 0  Write a sentence   0 0  Copy design   0 0  Total score   19 20        09/26/2023   11:19 AM  6CIT Screen  What Year? 0 points  What month? 0 points  What time? 0 points  Count back from 20 0 points  Months in reverse 0 points  Repeat phrase 0 points  Total Score 0 points    Immunizations Immunization History  Administered Date(s) Administered   Fluad Quad(high Dose 65+) 03/26/2019, 04/12/2020, 02/25/2021, 03/12/2022   Fluad Trivalent(High Dose 65+) 03/11/2023   Influenza Nasal 03/07/2015   Influenza Whole 04/06/2007, 03/04/2008, 06/25/2009   Influenza, High Dose Seasonal PF 04/01/2018   Influenza-Unspecified 03/06/2012, 03/06/2013, 03/06/2014, 03/06/2016   PFIZER(Purple Top)SARS-COV-2 Vaccination 07/11/2019, 08/05/2019, 03/15/2020   Pfizer Covid-19 Vaccine Bivalent Booster 45yrs & up 02/12/2021   Pfizer(Comirnaty)Fall Seasonal Vaccine 12 years and older  04/14/2022   Pneumococcal Conjugate-13 08/08/2013   Pneumococcal Polysaccharide-23 05/11/2005   Td 10/28/1999, 07/06/2010   Zoster, Live 09/04/2013    Screening Tests Health Maintenance  Topic Date Due   Zoster Vaccines- Shingrix (1 of 2) 12/02/1984   DTaP/Tdap/Td (3 - Tdap) 07/06/2020   COVID-19 Vaccine (6 - 2024-25 season) 02/05/2023   MAMMOGRAM  06/23/2023   INFLUENZA VACCINE  01/05/2024   Medicare Annual Wellness (AWV)  09/25/2024   Pneumonia Vaccine 32+ Years old  Completed   DEXA SCAN  Completed   HPV VACCINES  Aged Out   Meningococcal B Vaccine  Aged Out    Health Maintenance  Health Maintenance Due  Topic Date Due   Zoster Vaccines- Shingrix (1 of 2) 12/02/1984   DTaP/Tdap/Td (3 - Tdap) 07/06/2020   COVID-19 Vaccine (6 - 2024-25 season) 02/05/2023   MAMMOGRAM  06/23/2023   Health Maintenance Items Addressed:patient states at her age she feels like she is done getting vaccinations  Additional Screening:  Vision Screening: Recommended annual ophthalmology exams for early detection of glaucoma and other disorders of the eye.  Dental Screening: Recommended annual dental exams for proper oral hygiene  Community Resource Referral / Chronic Care Management: CRR required this visit?  No   CCM required this visit?  No     Plan:     I have personally reviewed and noted the following in the patient's chart:   Medical and social history Use of alcohol, tobacco or illicit drugs  Current medications and supplements including opioid prescriptions. Patient is not currently taking opioid prescriptions. Functional ability and status Nutritional status Physical activity Advanced directives List of other physicians Hospitalizations, surgeries, and ER visits in previous 12 months Vitals Screenings to include cognitive, depression, and falls Referrals and appointments  In addition, I have reviewed and discussed with patient certain preventive protocols, quality  metrics, and best practice recommendations. A written personalized care plan for preventive services as well as general preventive health recommendations were provided to patient.     Freeda Jerry, [place]   09/26/2023   After Visit Summary: (MyChart) Due to this being a telephonic visit, the after visit summary with patients personalized plan was offered to patient via MyChart   Notes: Nothing significant to report at this time.

## 2023-09-26 NOTE — Patient Instructions (Signed)
 Ms. Lupien , Thank you for taking time to come for your Medicare Wellness Visit. I appreciate your ongoing commitment to your health goals. Please review the following plan we discussed and let me know if I can assist you in the future.   Referrals/Orders/Follow-Ups/Clinician Recommendations: follow up in 1 year for scheduled annual wellness visit.  This is a list of the screening recommended for you and due dates:  Health Maintenance  Topic Date Due   Zoster (Shingles) Vaccine (1 of 2) 12/02/1984   DTaP/Tdap/Td vaccine (3 - Tdap) 07/06/2020   COVID-19 Vaccine (6 - 2024-25 season) 02/05/2023   Mammogram  06/23/2023   Flu Shot  01/05/2024   Medicare Annual Wellness Visit  09/25/2024   Pneumonia Vaccine  Completed   DEXA scan (bone density measurement)  Completed   HPV Vaccine  Aged Out   Meningitis B Vaccine  Aged Out    Advanced directives: (In Chart) A copy of your advanced directives are scanned into your chart should your provider ever need it.  Next Medicare Annual Wellness Visit scheduled for next year: Yes

## 2023-10-10 ENCOUNTER — Ambulatory Visit: Payer: Medicare Other | Admitting: Cardiology

## 2023-10-31 ENCOUNTER — Encounter: Payer: Self-pay | Admitting: Cardiology

## 2023-10-31 ENCOUNTER — Ambulatory Visit: Payer: Medicare Other | Attending: Cardiology | Admitting: Cardiology

## 2023-10-31 VITALS — BP 136/78 | HR 95 | Ht 67.75 in | Wt 161.0 lb

## 2023-10-31 DIAGNOSIS — I4821 Permanent atrial fibrillation: Secondary | ICD-10-CM

## 2023-10-31 DIAGNOSIS — Z952 Presence of prosthetic heart valve: Secondary | ICD-10-CM | POA: Diagnosis not present

## 2023-10-31 NOTE — Progress Notes (Signed)
 Cardiology Office Note:  .   Date:  10/31/2023  ID:  [name], DOB 1932-11-06, MRN [id number] PCP: Claire Crick, MD  [facility] Providers Cardiologist:  Dorothye Gathers, MD Structural Heart:  Arnoldo Lapping, MD    History of Present Illness: .   Judy Walker is a 89 y.o. female Discussed the use of AI scribe software for clinical note transcription with the patient, who gave verbal consent to proceed.  History of Present Illness Judy Walker is an 89 year old female with severe paradoxical low flow, low gradient aortic stenosis who presents for follow-up post TAVR.  She underwent a transcatheter aortic valve replacement (TAVR) on April 18, 2023, using a 26 mm Edward Sapien 3 ultraresilient THV via transfemoral approach. Post-operative echocardiogram showed an ejection fraction of 65% with a mean gradient of 4.5 mmHg and no paravalvular leak. She feels that the procedure went well and has been doing well since then.  She is currently on Eliquis  5 mg twice daily for permanent atrial fibrillation and Cardizem  CD 240 mg daily. She also takes Valsartan  320 mg, Hydrochlorothiazide  25 mg, and Atorvastatin  20 mg. No issues with her medications and states 'I have nothing to complain about.'  Her breathing is fine, and she is able to work for 15 minutes before needing to rest. She lives alone and manages all her household tasks independently. She has learned to listen to her body and rest when needed to avoid feeling 'messed up' in the head.  She is planning a trip to the beach next week and is active in her family life, mentioning her son's upcoming trips to [place]  and [place]. She expresses contentment with her current health status and lifestyle.      ROS: No CP, no syncope  Studies Reviewed: .        Results LABS Hb: 13.6 g/dL Cr: 1.1 mg/dL LDL: 409 mg/dL  DIAGNOSTIC Echocardiogram: EF 65%, mean gradient 4.5 mmHg, no PVL, mild to moderate MR and TR  (04/18/2023) Risk Assessment/Calculations:            Physical Exam:   VS:  BP 136/78   Pulse 95   Ht 5' 7.75" (1.721 m)   Wt 161 lb (73 kg)   SpO2 97%   BMI 24.66 kg/m    Wt Readings from Last 3 Encounters:  10/31/23 161 lb (73 kg)  09/26/23 150 lb (68 kg)  05/23/23 158 lb 4 oz (71.8 kg)    GEN: Well nourished, well developed in no acute distress NECK: No JVD; No carotid bruits CARDIAC:Irreg, no murmurs, no rubs, no gallops RESPIRATORY:  Clear to auscultation without rales, wheezing or rhonchi  ABDOMEN: Soft, non-tender, non-distended EXTREMITIES:  No edema; No deformity   ASSESSMENT AND PLAN: .    Assessment and Plan Assessment & Plan Aortic stenosis, severe Status post TAVR on April 18, 2023, for severe paradoxical low flow, low gradient aortic stenosis. Post-operative echocardiogram showed an ejection fraction of 65% with a mean gradient of 4.5 mmHg and no paravalvular leak. No significant murmurs on examination. She reports adequate breathing and can perform activities with intermittent rest. Encouraged to continue exertion while respecting her limits.  Mitral and tricuspid regurgitation Mild to moderate mitral and tricuspid regurgitation on echocardiogram. No significant murmurs on examination.  Atrial fibrillation Permanent atrial fibrillation managed with Eliquis  5 mg twice daily and Cardizem  CD 240 mg daily. No issues with Eliquis . Cardizem  also aids in blood pressure control.  Hypertension Blood pressure  well-controlled with Valsartan  320 mg daily, Hydrochlorothiazide  25 mg daily, and Cardizem  CD 240 mg daily.         Dispo: 1 yr  Signed, Dorothye Gathers, MD

## 2023-10-31 NOTE — Patient Instructions (Signed)

## 2023-11-01 ENCOUNTER — Telehealth: Payer: Self-pay | Admitting: Cardiology

## 2023-11-01 DIAGNOSIS — I4821 Permanent atrial fibrillation: Secondary | ICD-10-CM

## 2023-11-01 MED ORDER — APIXABAN 5 MG PO TABS: 5.0000 mg | ORAL_TABLET | Freq: Two times a day (BID) | ORAL | 1 refills | Status: DC

## 2023-11-01 NOTE — Telephone Encounter (Signed)
 Prescription refill request for Eliquis  received. Indication: a fib Last office visit: 10/31/23 Scr: 1.1 epic 05/23/23 Age: 89 Weight: 73kg

## 2023-11-01 NOTE — Telephone Encounter (Signed)
*  STAT* If patient is at the pharmacy, call can be transferred to refill team.   1. Which medications need to be refilled? (please list name of each medication and dose if known)    ELIQUIS 5 MG TABS tablet    2. Which pharmacy/location (including street and city if local pharmacy) is medication to be sent to? [facility] - [place], [place] - [address]   3. Do they need a 30 day or 90 day supply? 90  

## 2024-01-16 ENCOUNTER — Other Ambulatory Visit: Payer: Medicare Other

## 2024-01-16 ENCOUNTER — Ambulatory Visit
Admission: RE | Admit: 2024-01-16 | Discharge: 2024-01-16 | Disposition: A | Discharge status: Home / Self Care | Payer: Medicare Other | Source: Ambulatory Visit | Attending: Family Medicine | Admitting: Family Medicine

## 2024-01-16 DIAGNOSIS — Z1231 Encounter for screening mammogram for malignant neoplasm of breast: Secondary | ICD-10-CM | POA: Diagnosis not present

## 2024-01-16 DIAGNOSIS — Z0000 Encounter for general adult medical examination without abnormal findings: Secondary | ICD-10-CM

## 2024-01-18 ENCOUNTER — Ambulatory Visit: Payer: Self-pay | Admitting: Family Medicine

## 2024-02-05 ENCOUNTER — Other Ambulatory Visit: Payer: Self-pay | Admitting: Cardiology

## 2024-02-05 DIAGNOSIS — I4821 Permanent atrial fibrillation: Secondary | ICD-10-CM

## 2024-02-06 NOTE — Telephone Encounter (Signed)
 Prescription refill request for Eliquis  received. Indication:afib Last office visit:5/25 Scr:1.10  12/24 Age: 89 Weight:73  kg  Prescription refilled

## 2024-03-24 ENCOUNTER — Other Ambulatory Visit: Payer: Self-pay | Admitting: Family Medicine

## 2024-03-24 ENCOUNTER — Other Ambulatory Visit: Payer: Self-pay | Admitting: Physician Assistant

## 2024-03-24 DIAGNOSIS — E7800 Pure hypercholesterolemia, unspecified: Secondary | ICD-10-CM

## 2024-03-24 DIAGNOSIS — E039 Hypothyroidism, unspecified: Secondary | ICD-10-CM

## 2024-03-24 DIAGNOSIS — Z8711 Personal history of peptic ulcer disease: Secondary | ICD-10-CM

## 2024-03-24 DIAGNOSIS — I10 Essential (primary) hypertension: Secondary | ICD-10-CM

## 2024-04-17 ENCOUNTER — Ambulatory Visit: Payer: Medicare Other

## 2024-04-17 ENCOUNTER — Other Ambulatory Visit (HOSPITAL_COMMUNITY): Payer: Medicare Other

## 2024-04-18 ENCOUNTER — Other Ambulatory Visit (HOSPITAL_COMMUNITY)

## 2024-04-18 ENCOUNTER — Ambulatory Visit: Admitting: Physician Assistant

## 2024-04-19 ENCOUNTER — Ambulatory Visit (HOSPITAL_COMMUNITY): Admitting: Cardiology

## 2024-04-19 ENCOUNTER — Ambulatory Visit (HOSPITAL_COMMUNITY)
Admission: RE | Admit: 2024-04-19 | Discharge: 2024-04-19 | Disposition: A | Discharge status: Home / Self Care | Source: Ambulatory Visit | Attending: Cardiovascular Disease | Admitting: Cardiovascular Disease

## 2024-04-19 DIAGNOSIS — Z952 Presence of prosthetic heart valve: Secondary | ICD-10-CM | POA: Diagnosis present

## 2024-04-19 LAB — ECHOCARDIOGRAM COMPLETE
AR max vel: 3.59 cm2
AV Area VTI: 3.73 cm2
AV Area mean vel: 3.66 cm2
AV Mean grad: 4 mmHg
AV Peak grad: 7.4 mmHg
Ao pk vel: 1.36 m/s
MV M vel: 5.34 m/s
MV Peak grad: 114.1 mmHg
S' Lateral: 2.2 cm

## 2024-04-22 ENCOUNTER — Telehealth: Payer: Self-pay | Admitting: Physician Assistant

## 2024-04-22 ENCOUNTER — Ambulatory Visit: Payer: Self-pay | Admitting: Physician Assistant

## 2024-04-22 NOTE — Telephone Encounter (Addendum)
  [facility]   MULTIDISCIPLINARY HEART VALVE TEAM   Pt underwent TAVR on 04/18/23 with Dr. Wonda.   Echo 04/19/24 showed EF 70%, moderate MR and normally functioning TAVR with a mean gradient of 4 mmHg and mild AI (new from previous). She was supposed to be seen in the office by Dr. Jeffrie after her echo but this had to be r/s due to MD schedule changes. Apt r/s for Feb 2026.  I called her today to offer her a new apt with me, but pt politely declined. Thankfully, she has NYHA class I symptoms. KCCQ completed below.   [place] Cardiomyopathy Questionnaire     04/22/2024    3:13 PM 05/19/2023    9:11 AM 03/20/2023    2:31 PM  KCCQ-12  1 a. Ability to shower/bathe Slightly limited Not at all limited Not at all limited  1 b. Ability to walk 1 block Not at all limited Not at all limited Quite a bit limited  1 c. Ability to hurry/jog Other, Did not do Not at all limited Extremely limited  2. Edema feet/ankles/legs Never over the past 2 weeks Never over the past 2 weeks Never over the past 2 weeks  3. Limited by fatigue Never over the past 2 weeks Never over the past 2 weeks Several times a day  4. Limited by dyspnea Never over the past 2 weeks Never over the past 2 weeks Several times a day  5. Sitting up / on 3+ pillows Never over the past 2 weeks Never over the past 2 weeks Never over the past 2 weeks  6. Limited enjoyment of life Not limited at all Not limited at all Slightly limited  7. Rest of life w/ symptoms Completely satisfied Completely satisfied Mostly dissatisfied  8 a. Participation in hobbies Did not limit at all Did not limit at all Slightly limited  8 b. Participation in chores Did not limit at all Did not limit at all Moderately limited  8 c. Visiting family/friends Did not limit at all Did not limit at all Did not limit at all    Lamarr Hummer PA-C  [facility]

## 2024-05-01 ENCOUNTER — Other Ambulatory Visit: Payer: Self-pay | Admitting: Family Medicine

## 2024-05-01 DIAGNOSIS — I10 Essential (primary) hypertension: Secondary | ICD-10-CM

## 2024-05-12 ENCOUNTER — Other Ambulatory Visit: Payer: Self-pay | Admitting: Family Medicine

## 2024-05-12 DIAGNOSIS — E039 Hypothyroidism, unspecified: Secondary | ICD-10-CM

## 2024-05-12 DIAGNOSIS — I4821 Permanent atrial fibrillation: Secondary | ICD-10-CM

## 2024-05-12 DIAGNOSIS — E559 Vitamin D deficiency, unspecified: Secondary | ICD-10-CM

## 2024-05-12 DIAGNOSIS — N183 Chronic kidney disease, stage 3 unspecified: Secondary | ICD-10-CM

## 2024-05-12 DIAGNOSIS — E7800 Pure hypercholesterolemia, unspecified: Secondary | ICD-10-CM

## 2024-05-17 ENCOUNTER — Other Ambulatory Visit: Payer: Medicare Other

## 2024-05-17 ENCOUNTER — Other Ambulatory Visit

## 2024-05-24 ENCOUNTER — Encounter: Payer: Self-pay | Admitting: Family Medicine

## 2024-05-24 ENCOUNTER — Ambulatory Visit: Payer: Medicare Other | Admitting: Family Medicine

## 2024-05-24 VITALS — BP 138/84 | HR 102 | Temp 97.4°F | Ht 67.75 in | Wt 156.8 lb

## 2024-05-24 DIAGNOSIS — E7800 Pure hypercholesterolemia, unspecified: Secondary | ICD-10-CM

## 2024-05-24 DIAGNOSIS — I4821 Permanent atrial fibrillation: Secondary | ICD-10-CM | POA: Diagnosis not present

## 2024-05-24 DIAGNOSIS — M85859 Other specified disorders of bone density and structure, unspecified thigh: Secondary | ICD-10-CM

## 2024-05-24 DIAGNOSIS — Z8711 Personal history of peptic ulcer disease: Secondary | ICD-10-CM | POA: Diagnosis not present

## 2024-05-24 DIAGNOSIS — I10 Essential (primary) hypertension: Secondary | ICD-10-CM

## 2024-05-24 DIAGNOSIS — N183 Chronic kidney disease, stage 3 unspecified: Secondary | ICD-10-CM

## 2024-05-24 DIAGNOSIS — Z0000 Encounter for general adult medical examination without abnormal findings: Secondary | ICD-10-CM | POA: Diagnosis not present

## 2024-05-24 DIAGNOSIS — Z952 Presence of prosthetic heart valve: Secondary | ICD-10-CM | POA: Diagnosis not present

## 2024-05-24 DIAGNOSIS — E559 Vitamin D deficiency, unspecified: Secondary | ICD-10-CM

## 2024-05-24 DIAGNOSIS — G4700 Insomnia, unspecified: Secondary | ICD-10-CM | POA: Diagnosis not present

## 2024-05-24 DIAGNOSIS — E039 Hypothyroidism, unspecified: Secondary | ICD-10-CM

## 2024-05-24 DIAGNOSIS — Z7189 Other specified counseling: Secondary | ICD-10-CM

## 2024-05-24 LAB — LIPID PANEL
Cholesterol: 192 mg/dL (ref 28–200)
HDL: 50.5 mg/dL
LDL Cholesterol: 102 mg/dL — ABNORMAL HIGH (ref 10–99)
NonHDL: 141.2
Total CHOL/HDL Ratio: 4
Triglycerides: 196 mg/dL — ABNORMAL HIGH (ref 10.0–149.0)
VLDL: 39.2 mg/dL (ref 0.0–40.0)

## 2024-05-24 LAB — MICROALBUMIN / CREATININE URINE RATIO
Creatinine,U: 232.2 mg/dL
Microalb Creat Ratio: 22.9 mg/g (ref 0.0–30.0)
Microalb, Ur: 5.3 mg/dL — ABNORMAL HIGH (ref 0.7–1.9)

## 2024-05-24 LAB — CBC WITH DIFFERENTIAL/PLATELET
Basophils Absolute: 0 K/uL (ref 0.0–0.1)
Basophils Relative: 1 % (ref 0.0–3.0)
Eosinophils Absolute: 0.2 K/uL (ref 0.0–0.7)
Eosinophils Relative: 3.8 % (ref 0.0–5.0)
HCT: 39.6 % (ref 36.0–46.0)
Hemoglobin: 13.4 g/dL (ref 12.0–15.0)
Lymphocytes Relative: 20.6 % (ref 12.0–46.0)
Lymphs Abs: 1 K/uL (ref 0.7–4.0)
MCHC: 33.9 g/dL (ref 30.0–36.0)
MCV: 101.1 fl — ABNORMAL HIGH (ref 78.0–100.0)
Monocytes Absolute: 0.5 K/uL (ref 0.1–1.0)
Monocytes Relative: 9.8 % (ref 3.0–12.0)
Neutro Abs: 3.2 K/uL (ref 1.4–7.7)
Neutrophils Relative %: 64.8 % (ref 43.0–77.0)
Platelets: 244 K/uL (ref 150.0–400.0)
RBC: 3.92 Mil/uL (ref 3.87–5.11)
RDW: 12.6 % (ref 11.5–15.5)
WBC: 5 K/uL (ref 4.0–10.5)

## 2024-05-24 LAB — COMPREHENSIVE METABOLIC PANEL WITH GFR
ALT: 13 U/L (ref 3–35)
AST: 15 U/L (ref 5–37)
Albumin: 4.6 g/dL (ref 3.5–5.2)
Alkaline Phosphatase: 60 U/L (ref 39–117)
BUN: 32 mg/dL — ABNORMAL HIGH (ref 6–23)
CO2: 30 meq/L (ref 19–32)
Calcium: 9.6 mg/dL (ref 8.4–10.5)
Chloride: 100 meq/L (ref 96–112)
Creatinine, Ser: 1.32 mg/dL — ABNORMAL HIGH (ref 0.40–1.20)
GFR: 35.8 mL/min — ABNORMAL LOW
Glucose, Bld: 107 mg/dL — ABNORMAL HIGH (ref 70–99)
Potassium: 3.8 meq/L (ref 3.5–5.1)
Sodium: 140 meq/L (ref 135–145)
Total Bilirubin: 0.8 mg/dL (ref 0.2–1.2)
Total Protein: 7.3 g/dL (ref 6.0–8.3)

## 2024-05-24 LAB — PHOSPHORUS: Phosphorus: 3.8 mg/dL (ref 2.3–4.6)

## 2024-05-24 LAB — TSH: TSH: 4.14 u[IU]/mL (ref 0.35–5.50)

## 2024-05-24 LAB — VITAMIN D 25 HYDROXY (VIT D DEFICIENCY, FRACTURES): VITD: 30.41 ng/mL (ref 30.00–100.00)

## 2024-05-24 MED ORDER — LEVOTHYROXINE SODIUM 75 MCG PO TABS: 75.0000 ug | ORAL_TABLET | Freq: Every day | ORAL | 3 refills | Status: AC

## 2024-05-24 MED ORDER — PANTOPRAZOLE SODIUM 40 MG PO TBEC: 40.0000 mg | DELAYED_RELEASE_TABLET | Freq: Every day | ORAL | 3 refills | Status: AC

## 2024-05-24 MED ORDER — HYDROCHLOROTHIAZIDE 25 MG PO TABS: 25.0000 mg | ORAL_TABLET | Freq: Every day | ORAL | 3 refills | Status: AC

## 2024-05-24 MED ORDER — ATORVASTATIN CALCIUM 20 MG PO TABS: 20.0000 mg | ORAL_TABLET | Freq: Every day | ORAL | 3 refills | Status: AC

## 2024-05-24 MED ORDER — METOPROLOL SUCCINATE ER 25 MG PO TB24: 12.5000 mg | ORAL_TABLET | Freq: Every day | ORAL | 1 refills | Status: AC

## 2024-05-24 MED ORDER — VALSARTAN 320 MG PO TABS: 320.0000 mg | ORAL_TABLET | Freq: Every day | ORAL | 3 refills | Status: AC

## 2024-05-24 NOTE — Assessment & Plan Note (Signed)
 Chronic followed by cardiology on eliquis  and diltiazem .  Heart rate running elevated and she endorses some intermittent dizziness. Will start Toprol  XL 12.5mg  daily, update with effect. Keep cards f/u 07/2024

## 2024-05-24 NOTE — Assessment & Plan Note (Signed)
 Preventative protocols reviewed and updated unless pt declined. Discussed healthy diet and lifestyle.

## 2024-05-24 NOTE — Assessment & Plan Note (Signed)
 Chronic, stable. Continue current regimen.

## 2024-05-24 NOTE — Assessment & Plan Note (Signed)
 Update renal function.

## 2024-05-24 NOTE — Assessment & Plan Note (Signed)
Update levels off replacement. 

## 2024-05-24 NOTE — Assessment & Plan Note (Signed)
 Chronic, stable period on atorvastatin  - continue. The ASCVD Risk score (Arnett DK, et al., 2019) failed to calculate for the following reasons:   The 2019 ASCVD risk score is only valid for ages 27 to 64   * - Cholesterol units were assumed

## 2024-05-24 NOTE — Patient Instructions (Addendum)
 Labs today  Medicines refilled today Add Toprol  XL 25mg  1/2 tablet daily (12.5mg ) for heart rate control.  Call to to schedule bone density scan: [facility] [phone] Good to see you today!  Return as needed or in 1 year for next physical

## 2024-05-24 NOTE — Progress Notes (Signed)
 " Ph: [phone] Fax: [phone]   Patient ID: Judy Walker, female    DOB: [birth year], 89 y.o.   MRN: [id number]  This visit was conducted in person.  BP 138/84 (Cuff Size: Normal)   Pulse (!) 102   Temp (!) 97.4 F (36.3 C) (Oral)   Ht 5' 7.75 (1.721 m)   Wt 156 lb 12.8 oz (71.1 kg)   SpO2 98%   BMI 24.02 kg/m   BP Readings from Last 3 Encounters:  05/24/24 138/84  10/31/23 136/78  09/26/23 (!) 136/92    Pulse Readings from Last 3 Encounters:  05/24/24 (!) 102  10/31/23 95  05/23/23 (!) 104   CC: CPE Subjective:   HPI: Judy Walker is a 89 y.o. female presenting on 05/24/2024 for Annual Exam (No concerns)   Saw health advisor 09/2023 for medicare wellness visit. Note reviewed.   No results found.  Flowsheet Row Office Visit from 05/24/2024 in [facility] [place]  PHQ-2 Total Score 0       05/24/2024   10:27 AM 09/26/2023   11:21 AM 05/23/2023   10:37 AM 05/20/2022   10:08 AM 05/14/2021   10:33 AM  Fall Risk   Falls in the past year? 0 0 0 0 0  Number falls in past yr: 0 0   0  Injury with Fall? 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 Eliquis  was stopped (afib s/p cardioversion 06/2019 with recurrence), PPI started. Subsequent EGD showing 4cm HH, healed ulcers, likely scarring as mucosal change. Eliquis  subsequently restarted. She last saw GI 08/2021, planned Q76yr check, continue pantoprazole  40mg  daily.   H/o moderately severe myxomatous MR and moderate TR along with severe aortic stenosis s/p TAVR 04/2023 due to progressive exertional dyspnea dizziness and fatigue. Needs  amoxicillin  for SBE ppx.   She has been taking benadryl  for sleep -rec stop this.  Notes some intermittent dizziness.   Preventative: Colon cancer screening - iFOB positive 2015. We referred to Dr Avram per pt preference 2015, pt did not go. No fmhx colon cancer. Rpt iFOB returned negative (2016). Pt requested screening stopped - no new blood in stool or bowel changes since.  No blood in urine.  Well woman - benign hysterectomy 2003, ovarian cysts. Aged out. no vaginal bleeding Mammogram - Birads1 [facility]. Plans Q1-26yrs. Does breast exams at home. Sisters x2 with breast cancer.  DEXA - T score -1.9 (07/2012), T -2.3 femur (09/2015). Has been on fosamax  since ~2014, stopped 2019.  DEXA 03/2019 - T -2.0 hip. Will order repeat  Doesn't like dairy products. Takes calcium  supplement daily. Recommend regular weight bearing exercise.  Lung cancer screening - not eligible  Flu shot yearly COVID vaccine Pfizer 07/2019, 08/2019, booster 03/2020, bivalent booster 02/2021, booster 04/2022 Pneumovax 2006, prevnar-13 2015. Declines repeat - felt ill with this Td 2012.  Zostavax - 09/2013.  Shingrix - discussed, declines. Advanced directives - scanned and in chart 09/2017. HCPOA are son Norleen and daughter Rojelio. Does not desire life prolonging measures if terminal condition Seatbelt use  discussed. Sunscreen use discussed.  No suspicious moles.  Sleep - averaging 7 hours/night  Non smoker  Alcohol - rarely  Eye doctor - sees yearly, s/p cataract surgery 2010s. No fmhx glaucoma  Dentist - yearly  Bowel - no constipation  Bladder - no incontinence    Doesn't drive - never has. Lives alone. Widow 2012. Spends 1/2 year at [employer] Daughter lives nearby. Son also helps.  Occupation: retired Catering Manager  Activity: Regular exercise - walking 1-2 miles daily, stays active in yard.  Diet: daily fruits/vegetables, some water.       Relevant past medical, surgical, family and social history  reviewed and updated as indicated. Interim medical history since our last visit reviewed. Allergies and medications reviewed and updated. Outpatient Medications Prior to Visit  Medication Sig Dispense Refill   amoxicillin  (AMOXIL ) 500 MG capsule Take 4 capsules (2,000 mg total) by mouth as needed. Take 4 capsules (2,000 mg total) prior to dental appointments 16 capsule 3   Ascorbic Acid (VITAMIN C  GUMMIE) 120 MG CHEW Chew 250 mg by mouth daily.     Calcium  Carbonate (CALCIUM  500 PO) Take 500 mg by mouth daily.     CARTIA  XT 240 MG 24 hr capsule TAKE 1 CAPSULE BY MOUTH DAILY 100 capsule 2   ELIQUIS  5 MG TABS tablet TAKE 1 TABLET BY MOUTH TWICE  DAILY 200 tablet 2   Multiple Vitamins-Minerals (MULTIVITAMIN ADULT) TABS Take 1 tablet by mouth daily.     potassium gluconate 595 (99 K) MG TABS tablet Take 595 mg by mouth in the morning.     atorvastatin  (LIPITOR) 20 MG tablet TAKE 1 TABLET BY MOUTH DAILY 100 tablet 0   diphenhydrAMINE  (BENADRYL ) 25 mg capsule Take 25 mg by mouth at bedtime. Sleep     hydrochlorothiazide  (HYDRODIURIL ) 25 MG tablet TAKE 1 TABLET BY MOUTH DAILY 100 tablet 0   levothyroxine  (SYNTHROID ) 75 MCG tablet TAKE 1 TABLET BY MOUTH DAILY 100 tablet 0   pantoprazole  (PROTONIX ) 40 MG tablet TAKE 1 TABLET BY MOUTH DAILY 100 tablet 0   valsartan  (DIOVAN ) 320 MG tablet TAKE 1 TABLET BY MOUTH DAILY 100 tablet 0   No facility-administered medications prior to visit.     Per HPI unless specifically indicated in ROS section below Review of Systems  Constitutional:  Negative for activity change, appetite change, chills, fatigue, fever and unexpected weight change.  HENT:  Negative for hearing loss.   Eyes:  Negative for visual disturbance.  Respiratory:  Negative for cough, chest tightness, shortness of breath and wheezing.   Cardiovascular:  Negative for chest pain, palpitations and leg swelling.  Gastrointestinal:  Negative for abdominal distention, abdominal pain, blood in stool,  constipation, diarrhea, nausea and vomiting.  Genitourinary:  Negative for difficulty urinating and hematuria.  Musculoskeletal:  Negative for arthralgias, myalgias and neck pain.  Skin:  Negative for rash.  Neurological:  Positive for dizziness (occ). Negative for seizures, syncope and headaches.  Hematological:  Negative for adenopathy. Does not bruise/bleed easily.  Psychiatric/Behavioral:  Negative for dysphoric mood. The patient is not nervous/anxious.     Objective:  BP 138/84 (Cuff Size: Normal)   Pulse (!) 102   Temp (!) 97.4 F (36.3 C) (Oral)   Ht 5' 7.75 (1.721 m)   Wt 156 lb 12.8 oz (71.1 kg)   SpO2 98%   BMI 24.02 kg/m   Wt Readings from Last 3 Encounters:  05/24/24 156 lb 12.8 oz (71.1 kg)  10/31/23 161 lb (73 kg)  09/26/23 150 lb (68 kg)      Physical Exam Vitals and nursing note reviewed.  Constitutional:      Appearance: Normal appearance. She is not ill-appearing.  HENT:     Head: Normocephalic and atraumatic.     Right Ear: Tympanic membrane, ear canal and external ear normal. There is no impacted cerumen.     Left Ear: Tympanic membrane, ear canal and external ear normal. There is no impacted cerumen.     Mouth/Throat:     Mouth: Mucous membranes are moist.     Pharynx: Oropharynx is clear. No oropharyngeal exudate or posterior oropharyngeal erythema.  Eyes:     General:        Right eye: No discharge.        Left eye: No discharge.     Extraocular Movements: Extraocular movements intact.     Conjunctiva/sclera: Conjunctivae normal.     Pupils: Pupils are equal, round, and reactive to light.  Neck:     Thyroid : No thyroid  mass or thyromegaly.  Cardiovascular:     Rate and Rhythm: Regular rhythm. Tachycardia present.     Pulses: Normal pulses.     Heart sounds: Normal heart sounds. No murmur heard. Pulmonary:     Effort: Pulmonary effort is normal. No respiratory distress.     Breath sounds: Normal breath sounds. No wheezing, rhonchi or rales.   Abdominal:     General: Bowel sounds are normal. There is no distension.     Palpations: Abdomen is soft. There is no mass.     Tenderness: There is no abdominal tenderness. There is no guarding or rebound.     Hernia: No hernia is present.  Musculoskeletal:     Cervical back: Normal range of motion and neck supple. No rigidity.     Right lower leg: No edema.     Left lower leg: No edema.  Lymphadenopathy:     Cervical: No cervical adenopathy.  Skin:    General: Skin is warm and dry.     Findings: No rash.  Neurological:     General: No focal deficit present.     Mental Status: She is alert. Mental status is at baseline.  Psychiatric:        Mood and Affect: Mood normal.        Behavior: Behavior normal.       Lab Results  Component Value Date   NA 141 05/23/2023   CL 102 05/23/2023   K 4.1 05/23/2023   CO2 31 05/23/2023   BUN 29 (H) 05/23/2023   CREATININE 1.10 05/23/2023   GFR 44.88 (L) 05/23/2023   CALCIUM  9.4 05/23/2023   PHOS 3.5 05/23/2023   ALBUMIN 4.5 05/23/2023   GLUCOSE 100 (H) 05/23/2023    Lab Results  Component Value Date   CHOL 201 (H) 05/23/2023   HDL 56.20 05/23/2023   LDLCALC 110 (H) 05/23/2023   LDLDIRECT 97.0 09/10/2015   TRIG 175.0 (H) 05/23/2023   CHOLHDL 4 05/23/2023    Lab Results  Component Value Date   WBC 4.9 05/23/2023   HGB 13.6 05/23/2023   HCT 39.7 05/23/2023   MCV 102.4 (H) 05/23/2023   PLT 192.0 05/23/2023   Lab Results  Component Value Date   TSH 2.92 08/25/2023    Assessment & Plan:   Problem List Items Addressed This Visit     Essential hypertension (Chronic)   Chronic, stable. Continue current regimen.       Relevant Medications  atorvastatin  (LIPITOR) 20 MG tablet   hydrochlorothiazide  (HYDRODIURIL ) 25 MG tablet   valsartan  (DIOVAN ) 320 MG tablet   metoprolol  succinate (TOPROL -XL) 25 MG 24 hr tablet   Health maintenance examination - Primary (Chronic)   Preventative protocols reviewed and updated unless pt  declined. Discussed healthy diet and lifestyle.       Permanent atrial fibrillation (HCC) (Chronic)   Chronic followed by cardiology on eliquis  and diltiazem .  Heart rate running elevated and she endorses some intermittent dizziness. Will start Toprol  XL 12.5mg  daily, update with effect. Keep cards f/u 07/2024      Relevant Medications   atorvastatin  (LIPITOR) 20 MG tablet   hydrochlorothiazide  (HYDRODIURIL ) 25 MG tablet   valsartan  (DIOVAN ) 320 MG tablet   metoprolol  succinate (TOPROL -XL) 25 MG 24 hr tablet   Pure hypercholesterolemia (Chronic)   Chronic, stable period on atorvastatin  - continue. The ASCVD Risk score (Arnett DK, et al., 2019) failed to calculate for the following reasons:   The 2019 ASCVD risk score is only valid for ages 22 to 35   * - Cholesterol units were assumed       Relevant Medications   atorvastatin  (LIPITOR) 20 MG tablet   hydrochlorothiazide  (HYDRODIURIL ) 25 MG tablet   valsartan  (DIOVAN ) 320 MG tablet   metoprolol  succinate (TOPROL -XL) 25 MG 24 hr tablet   Advanced directives, counseling/discussion (Chronic)   Previously discussed - copy in chart      Hypothyroidism   Update TSH on levothyroxine  75mcg daily.       Relevant Medications   levothyroxine  (SYNTHROID ) 75 MCG tablet   metoprolol  succinate (TOPROL -XL) 25 MG 24 hr tablet   Osteopenia of hip   Completed 5 years of fosamax  treatment Update DEXA scan at [facility] building.  Continue good dietary calcium  intake and regular weight bearing exercises.       Relevant Orders   DG Bone Density   Insomnia   Recommend stopping benadryl  due to possible effect on cognition. She agrees.  Update if further treatment needed.      Vitamin D  deficiency   Update levels off replacement.       History of gastric ulcer   Continue ppi daily.       Relevant Medications   pantoprazole  (PROTONIX ) 40 MG tablet   CKD (chronic kidney disease) stage 3, GFR 30-59 ml/min (HCC)   Update renal function.        S/P TAVR (transcatheter aortic valve replacement)     Meds ordered this encounter  Medications   atorvastatin  (LIPITOR) 20 MG tablet    Sig: Take 1 tablet (20 mg total) by mouth daily.    Dispense:  100 tablet    Refill:  3    Please send a replace/new response with 100-Day Supply if appropriate to maximize member benefit. Requesting 1 year supply.   hydrochlorothiazide  (HYDRODIURIL ) 25 MG tablet    Sig: Take 1 tablet (25 mg total) by mouth daily.    Dispense:  100 tablet    Refill:  3    Please send a replace/new response with 100-Day Supply if appropriate to maximize member benefit. Requesting 1 year supply.   levothyroxine  (SYNTHROID ) 75 MCG tablet    Sig: Take 1 tablet (75 mcg total) by mouth daily.    Dispense:  100 tablet    Refill:  3    Please send a replace/new response with 100-Day Supply if appropriate to maximize member benefit. Requesting 1 year supply.   pantoprazole  (PROTONIX ) 40 MG tablet  Sig: Take 1 tablet (40 mg total) by mouth daily.    Dispense:  100 tablet    Refill:  3    Please send a replace/new response with 100-Day Supply if appropriate to maximize member benefit. Requesting 1 year supply.   valsartan  (DIOVAN ) 320 MG tablet    Sig: Take 1 tablet (320 mg total) by mouth daily.    Dispense:  100 tablet    Refill:  3    Please send a replace/new response with 100-Day Supply if appropriate to maximize member benefit. Requesting 1 year supply.   metoprolol  succinate (TOPROL -XL) 25 MG 24 hr tablet    Sig: Take 0.5 tablets (12.5 mg total) by mouth daily.    Dispense:  30 tablet    Refill:  1    Orders Placed This Encounter  Procedures   DG Bone Density    Standing Status:   Future    Expiration Date:   05/24/2025    Reason for Exam (SYMPTOM  OR DIAGNOSIS REQUIRED):   f/u osteopenia    Preferred imaging location?:   [address]    Patient Instructions  Labs today  Medicines refilled today Add Toprol  XL 25mg  1/2 tablet daily (12.5mg )  for heart rate control.  Call to to schedule bone density scan:  [facility] [phone] Good to see you today!  Return as needed or in 1 year for next physical   Follow up plan: Return in about 1 year (around 05/24/2025) for annual exam, prior fasting for blood work, medicare wellness visit.  Anton Blas, MD   "

## 2024-05-24 NOTE — Assessment & Plan Note (Addendum)
 Completed 5 years of fosamax  treatment Update DEXA scan at [facility] building.  Continue good dietary calcium  intake and regular weight bearing exercises.

## 2024-05-24 NOTE — Assessment & Plan Note (Signed)
 Recommend stopping benadryl  due to possible effect on cognition. She agrees.  Update if further treatment needed.

## 2024-05-24 NOTE — Assessment & Plan Note (Addendum)
 Previously discussed - copy in chart

## 2024-05-24 NOTE — Assessment & Plan Note (Signed)
 Continue ppi daily.

## 2024-05-24 NOTE — Assessment & Plan Note (Signed)
Update TSH on levothyroxine 75mcg daily 

## 2024-05-25 LAB — PARATHYROID HORMONE, INTACT (NO CA): PTH: 48 pg/mL (ref 16–77)

## 2024-06-03 ENCOUNTER — Ambulatory Visit: Payer: Self-pay | Admitting: Family Medicine

## 2024-06-24 ENCOUNTER — Telehealth: Payer: Self-pay | Admitting: Family Medicine

## 2024-06-24 DIAGNOSIS — I4821 Permanent atrial fibrillation: Secondary | ICD-10-CM

## 2024-06-24 DIAGNOSIS — I6523 Occlusion and stenosis of bilateral carotid arteries: Secondary | ICD-10-CM

## 2024-06-24 DIAGNOSIS — Z952 Presence of prosthetic heart valve: Secondary | ICD-10-CM

## 2024-06-24 NOTE — Telephone Encounter (Signed)
 Copied from CRM [id number]. Topic: General - Other >> Jun 24, 2024 11:00 AM Pinkey ORN wrote: Patient calling again in reference to needing a prior authorization for her referral to the Cardiologist (Dr Oneil Parchment). Patient states she contacted her insurance and Dr. Parchment office and the only way that her insurance will 100% cover it, is if Rilla Baller, MD submits a prior authorization. Please follow up with the patient for any further questions or concerns.

## 2024-06-24 NOTE — Telephone Encounter (Signed)
 Referral signed. Plz notify pt.

## 2024-07-24 ENCOUNTER — Ambulatory Visit: Admitting: Cardiology

## 2024-09-26 ENCOUNTER — Ambulatory Visit

## 2024-09-27 ENCOUNTER — Ambulatory Visit

## 2025-05-16 ENCOUNTER — Other Ambulatory Visit

## 2025-05-27 ENCOUNTER — Encounter: Admitting: Family Medicine
